# Patient Record
Sex: Male | Born: 1950
Health system: Southern US, Community
[De-identification: ages and names within clinical notes are randomized; demographics above are authoritative.]

## PROBLEM LIST (undated history)

## (undated) DIAGNOSIS — I341 Nonrheumatic mitral (valve) prolapse: Secondary | ICD-10-CM

## (undated) DIAGNOSIS — R29898 Other symptoms and signs involving the musculoskeletal system: Secondary | ICD-10-CM

## (undated) DIAGNOSIS — R011 Cardiac murmur, unspecified: Secondary | ICD-10-CM

## (undated) DIAGNOSIS — N200 Calculus of kidney: Secondary | ICD-10-CM

## (undated) DIAGNOSIS — I00 Rheumatic fever without heart involvement: Secondary | ICD-10-CM

## (undated) DIAGNOSIS — I8390 Asymptomatic varicose veins of unspecified lower extremity: Secondary | ICD-10-CM

## (undated) DIAGNOSIS — I509 Heart failure, unspecified: Secondary | ICD-10-CM

## (undated) DIAGNOSIS — I4891 Unspecified atrial fibrillation: Secondary | ICD-10-CM

## (undated) DIAGNOSIS — R002 Palpitations: Secondary | ICD-10-CM

## (undated) DIAGNOSIS — K219 Gastro-esophageal reflux disease without esophagitis: Secondary | ICD-10-CM

## (undated) DIAGNOSIS — I10 Essential (primary) hypertension: Secondary | ICD-10-CM

## (undated) DIAGNOSIS — E785 Hyperlipidemia, unspecified: Secondary | ICD-10-CM

## (undated) DIAGNOSIS — M1990 Unspecified osteoarthritis, unspecified site: Secondary | ICD-10-CM

## (undated) HISTORY — PX: URETER ECTOPIC RESECTION: SHX2608

## (undated) HISTORY — DX: Unspecified osteoarthritis, unspecified site: M19.90

## (undated) HISTORY — DX: Heart failure, unspecified: I50.9

## (undated) HISTORY — DX: Nonrheumatic mitral (valve) prolapse: I34.1

## (undated) HISTORY — DX: Asymptomatic varicose veins of unspecified lower extremity: I83.90

## (undated) HISTORY — DX: Rheumatic fever without heart involvement: I00

## (undated) HISTORY — DX: Essential (primary) hypertension: I10

## (undated) HISTORY — PX: TONSILLECTOMY: SUR1361

## (undated) HISTORY — PX: HEMORRHOID SURGERY: SHX153

## (undated) HISTORY — DX: Gastro-esophageal reflux disease without esophagitis: K21.9

## (undated) HISTORY — DX: Other symptoms and signs involving the musculoskeletal system: R29.898

## (undated) HISTORY — PX: HYDROCELE EXCISION / REPAIR: SUR1145

## (undated) HISTORY — DX: Calculus of kidney: N20.0

## (undated) HISTORY — PX: INGUINAL HERNIA REPAIR: SHX194

## (undated) HISTORY — DX: Hyperlipidemia, unspecified: E78.5

## (undated) HISTORY — PX: ROTATOR CUFF REPAIR: SHX139

## (undated) HISTORY — PX: OTHER SURGICAL HISTORY: SHX169

## (undated) HISTORY — PX: HERNIA REPAIR: SHX51

## (undated) HISTORY — DX: Palpitations: R00.2

## (undated) HISTORY — DX: Cardiac murmur, unspecified: R01.1

---

## 1999-04-02 ENCOUNTER — Encounter: Payer: Self-pay | Admitting: Emergency Medicine

## 1999-04-02 ENCOUNTER — Inpatient Hospital Stay (HOSPITAL_COMMUNITY): Admission: EM | Admit: 1999-04-02 | Discharge: 1999-04-03 | Payer: Self-pay | Admitting: Emergency Medicine

## 1999-04-03 ENCOUNTER — Encounter: Payer: Self-pay | Admitting: Cardiology

## 1999-11-26 ENCOUNTER — Encounter (HOSPITAL_BASED_OUTPATIENT_CLINIC_OR_DEPARTMENT_OTHER): Payer: Self-pay | Admitting: Internal Medicine

## 1999-11-26 ENCOUNTER — Ambulatory Visit (HOSPITAL_COMMUNITY): Admission: RE | Admit: 1999-11-26 | Discharge: 1999-11-26 | Payer: Self-pay | Admitting: Internal Medicine

## 2000-02-17 ENCOUNTER — Emergency Department (HOSPITAL_COMMUNITY): Admission: EM | Admit: 2000-02-17 | Discharge: 2000-02-17 | Payer: Self-pay | Admitting: Emergency Medicine

## 2000-06-13 ENCOUNTER — Emergency Department (HOSPITAL_COMMUNITY): Admission: EM | Admit: 2000-06-13 | Discharge: 2000-06-13 | Payer: Self-pay | Admitting: Emergency Medicine

## 2000-06-13 ENCOUNTER — Encounter: Payer: Self-pay | Admitting: Emergency Medicine

## 2000-08-04 ENCOUNTER — Encounter: Payer: Self-pay | Admitting: *Deleted

## 2000-08-04 ENCOUNTER — Emergency Department (HOSPITAL_COMMUNITY): Admission: EM | Admit: 2000-08-04 | Discharge: 2000-08-04 | Payer: Self-pay | Admitting: Emergency Medicine

## 2000-08-15 ENCOUNTER — Encounter: Payer: Self-pay | Admitting: Emergency Medicine

## 2000-08-15 ENCOUNTER — Inpatient Hospital Stay (HOSPITAL_COMMUNITY): Admission: EM | Admit: 2000-08-15 | Discharge: 2000-08-16 | Payer: Self-pay | Admitting: Emergency Medicine

## 2001-08-21 ENCOUNTER — Inpatient Hospital Stay (HOSPITAL_COMMUNITY): Admission: EM | Admit: 2001-08-21 | Discharge: 2001-08-22 | Payer: Self-pay | Admitting: Emergency Medicine

## 2001-08-21 ENCOUNTER — Encounter: Payer: Self-pay | Admitting: Emergency Medicine

## 2003-06-16 ENCOUNTER — Encounter: Admission: RE | Admit: 2003-06-16 | Discharge: 2003-06-16 | Payer: Self-pay | Admitting: Internal Medicine

## 2003-09-15 ENCOUNTER — Encounter
Admission: RE | Admit: 2003-09-15 | Discharge: 2003-09-15 | Payer: Self-pay | Admitting: Physical Medicine and Rehabilitation

## 2003-12-29 ENCOUNTER — Encounter
Admission: RE | Admit: 2003-12-29 | Discharge: 2003-12-29 | Payer: Self-pay | Admitting: Physical Medicine and Rehabilitation

## 2004-11-25 ENCOUNTER — Encounter
Admission: RE | Admit: 2004-11-25 | Discharge: 2004-11-25 | Payer: Self-pay | Admitting: Physical Medicine and Rehabilitation

## 2005-02-06 ENCOUNTER — Emergency Department (HOSPITAL_COMMUNITY): Admission: EM | Admit: 2005-02-06 | Discharge: 2005-02-06 | Payer: Self-pay | Admitting: Emergency Medicine

## 2005-03-31 ENCOUNTER — Encounter: Admission: RE | Admit: 2005-03-31 | Discharge: 2005-03-31 | Payer: Self-pay | Admitting: Surgery

## 2005-12-05 ENCOUNTER — Encounter: Admission: RE | Admit: 2005-12-05 | Discharge: 2005-12-05 | Payer: Self-pay | Admitting: Internal Medicine

## 2006-07-03 ENCOUNTER — Encounter: Admission: RE | Admit: 2006-07-03 | Discharge: 2006-07-03 | Payer: Self-pay | Admitting: Internal Medicine

## 2006-12-18 ENCOUNTER — Ambulatory Visit: Payer: Self-pay | Admitting: Internal Medicine

## 2007-01-01 ENCOUNTER — Ambulatory Visit: Payer: Self-pay | Admitting: Gastroenterology

## 2007-07-12 ENCOUNTER — Encounter: Admission: RE | Admit: 2007-07-12 | Discharge: 2007-07-12 | Payer: Self-pay | Admitting: Internal Medicine

## 2007-09-26 DIAGNOSIS — I059 Rheumatic mitral valve disease, unspecified: Secondary | ICD-10-CM | POA: Insufficient documentation

## 2007-09-26 DIAGNOSIS — I491 Atrial premature depolarization: Secondary | ICD-10-CM | POA: Insufficient documentation

## 2007-09-26 DIAGNOSIS — G43909 Migraine, unspecified, not intractable, without status migrainosus: Secondary | ICD-10-CM | POA: Insufficient documentation

## 2007-09-26 DIAGNOSIS — M129 Arthropathy, unspecified: Secondary | ICD-10-CM | POA: Insufficient documentation

## 2007-09-26 DIAGNOSIS — H9190 Unspecified hearing loss, unspecified ear: Secondary | ICD-10-CM | POA: Insufficient documentation

## 2007-09-26 DIAGNOSIS — Z87442 Personal history of urinary calculi: Secondary | ICD-10-CM | POA: Insufficient documentation

## 2007-09-26 DIAGNOSIS — I10 Essential (primary) hypertension: Secondary | ICD-10-CM | POA: Insufficient documentation

## 2008-07-18 ENCOUNTER — Ambulatory Visit (HOSPITAL_BASED_OUTPATIENT_CLINIC_OR_DEPARTMENT_OTHER): Admission: RE | Admit: 2008-07-18 | Discharge: 2008-07-18 | Payer: Self-pay | Admitting: Orthopedic Surgery

## 2009-03-24 ENCOUNTER — Ambulatory Visit (HOSPITAL_COMMUNITY): Admission: RE | Admit: 2009-03-24 | Discharge: 2009-03-24 | Payer: Self-pay | Admitting: Cardiology

## 2009-08-03 DIAGNOSIS — R059 Cough, unspecified: Secondary | ICD-10-CM | POA: Insufficient documentation

## 2010-05-04 ENCOUNTER — Telehealth (INDEPENDENT_AMBULATORY_CARE_PROVIDER_SITE_OTHER): Payer: Self-pay | Admitting: *Deleted

## 2010-07-03 ENCOUNTER — Encounter (HOSPITAL_BASED_OUTPATIENT_CLINIC_OR_DEPARTMENT_OTHER): Payer: Self-pay | Admitting: Internal Medicine

## 2010-07-15 NOTE — Progress Notes (Signed)
   DDS request received sent to [facility]  May 04, 2010 1:03 PM

## 2010-08-05 ENCOUNTER — Inpatient Hospital Stay (HOSPITAL_COMMUNITY)
Admission: EM | Admit: 2010-08-05 | Discharge: 2010-08-06 | DRG: 306 | Disposition: A | Discharge status: Home / Self Care | Payer: PRIVATE HEALTH INSURANCE | Attending: Cardiology | Admitting: Cardiology

## 2010-08-05 ENCOUNTER — Inpatient Hospital Stay (HOSPITAL_COMMUNITY): Payer: PRIVATE HEALTH INSURANCE

## 2010-08-05 ENCOUNTER — Emergency Department (HOSPITAL_COMMUNITY): Payer: PRIVATE HEALTH INSURANCE

## 2010-08-05 DIAGNOSIS — E785 Hyperlipidemia, unspecified: Secondary | ICD-10-CM | POA: Diagnosis present

## 2010-08-05 DIAGNOSIS — Z23 Encounter for immunization: Secondary | ICD-10-CM

## 2010-08-05 DIAGNOSIS — R0602 Shortness of breath: Secondary | ICD-10-CM

## 2010-08-05 DIAGNOSIS — Z7982 Long term (current) use of aspirin: Secondary | ICD-10-CM

## 2010-08-05 DIAGNOSIS — K219 Gastro-esophageal reflux disease without esophagitis: Secondary | ICD-10-CM | POA: Diagnosis present

## 2010-08-05 DIAGNOSIS — F3289 Other specified depressive episodes: Secondary | ICD-10-CM | POA: Diagnosis present

## 2010-08-05 DIAGNOSIS — F329 Major depressive disorder, single episode, unspecified: Secondary | ICD-10-CM | POA: Diagnosis present

## 2010-08-05 DIAGNOSIS — I059 Rheumatic mitral valve disease, unspecified: Principal | ICD-10-CM | POA: Diagnosis present

## 2010-08-05 DIAGNOSIS — I4891 Unspecified atrial fibrillation: Secondary | ICD-10-CM | POA: Diagnosis present

## 2010-08-05 DIAGNOSIS — I509 Heart failure, unspecified: Secondary | ICD-10-CM | POA: Diagnosis present

## 2010-08-05 DIAGNOSIS — I5021 Acute systolic (congestive) heart failure: Secondary | ICD-10-CM | POA: Diagnosis present

## 2010-08-05 DIAGNOSIS — I10 Essential (primary) hypertension: Secondary | ICD-10-CM | POA: Diagnosis present

## 2010-08-05 LAB — DIFFERENTIAL
Basophils Absolute: 0.1 10*3/uL (ref 0.0–0.1)
Basophils Relative: 1 % (ref 0–1)
Eosinophils Absolute: 0.1 10*3/uL (ref 0.0–0.7)
Eosinophils Relative: 1 % (ref 0–5)
Eosinophils Relative: 0 % (ref 0–5)
Lymphocytes Relative: 21 % (ref 12–46)
Lymphocytes Relative: 15 % (ref 12–46)
Lymphs Abs: 1.4 10*3/uL (ref 0.7–4.0)
Monocytes Absolute: 0.5 10*3/uL (ref 0.1–1.0)
Monocytes Relative: 5 % (ref 3–12)
Neutro Abs: 7.5 10*3/uL (ref 1.7–7.7)

## 2010-08-05 LAB — POCT I-STAT, CHEM 8
Glucose, Bld: 112 mg/dL — ABNORMAL HIGH (ref 70–99)
HCT: 42 % (ref 39.0–52.0)
Hemoglobin: 14.3 g/dL (ref 13.0–17.0)
Potassium: 4.6 mEq/L (ref 3.5–5.1)
TCO2: 25 mmol/L (ref 0–100)

## 2010-08-05 LAB — POCT CARDIAC MARKERS

## 2010-08-05 LAB — CBC
HCT: 40.7 % (ref 39.0–52.0)
HCT: 37.6 % — ABNORMAL LOW (ref 39.0–52.0)
Hemoglobin: 12.7 g/dL — ABNORMAL LOW (ref 13.0–17.0)
MCHC: 33.8 g/dL (ref 30.0–36.0)
MCV: 87.6 fL (ref 78.0–100.0)
Platelets: 231 10*3/uL (ref 150–400)
RDW: 14.3 % (ref 11.5–15.5)
RDW: 14.4 % (ref 11.5–15.5)
WBC: 10.1 10*3/uL (ref 4.0–10.5)

## 2010-08-05 LAB — PROTIME-INR
INR: 0.91 (ref 0.00–1.49)
Prothrombin Time: 12.5 seconds (ref 11.6–15.2)

## 2010-08-05 LAB — COMPREHENSIVE METABOLIC PANEL
ALT: 20 U/L (ref 0–53)
Albumin: 3.5 g/dL (ref 3.5–5.2)
Alkaline Phosphatase: 68 U/L (ref 39–117)
GFR calc Af Amer: >60 mL/min (ref >60)
Potassium: 3.9 mEq/L (ref 3.5–5.1)
Sodium: 136 mEq/L (ref 135–145)
Total Protein: 6.4 g/dL (ref 6.0–8.3)

## 2010-08-05 LAB — CARDIAC PANEL(CRET KIN+CKTOT+MB+TROPI)
CK, MB: 3.3 ng/mL (ref 0.3–4.0)
Relative Index: 2.5 (ref 0.0–2.5)

## 2010-08-06 LAB — BASIC METABOLIC PANEL
CO2: 32 mEq/L (ref 19–32)
Calcium: 9.2 mg/dL (ref 8.4–10.5)
Glucose, Bld: 106 mg/dL — ABNORMAL HIGH (ref 70–99)
Sodium: 140 mEq/L (ref 135–145)

## 2010-08-11 ENCOUNTER — Emergency Department (HOSPITAL_COMMUNITY): Payer: PRIVATE HEALTH INSURANCE

## 2010-08-11 ENCOUNTER — Inpatient Hospital Stay (HOSPITAL_COMMUNITY)
Admission: EM | Admit: 2010-08-11 | Discharge: 2010-08-16 | DRG: 287 | Disposition: A | Discharge status: Home Health | Payer: PRIVATE HEALTH INSURANCE | Attending: Cardiology | Admitting: Cardiology

## 2010-08-11 DIAGNOSIS — I059 Rheumatic mitral valve disease, unspecified: Secondary | ICD-10-CM | POA: Diagnosis present

## 2010-08-11 DIAGNOSIS — E876 Hypokalemia: Secondary | ICD-10-CM | POA: Diagnosis present

## 2010-08-11 DIAGNOSIS — E785 Hyperlipidemia, unspecified: Secondary | ICD-10-CM | POA: Diagnosis present

## 2010-08-11 DIAGNOSIS — I509 Heart failure, unspecified: Secondary | ICD-10-CM | POA: Diagnosis present

## 2010-08-11 DIAGNOSIS — H905 Unspecified sensorineural hearing loss: Secondary | ICD-10-CM | POA: Diagnosis present

## 2010-08-11 DIAGNOSIS — N289 Disorder of kidney and ureter, unspecified: Secondary | ICD-10-CM | POA: Diagnosis present

## 2010-08-11 DIAGNOSIS — N200 Calculus of kidney: Secondary | ICD-10-CM | POA: Diagnosis present

## 2010-08-11 DIAGNOSIS — Z87891 Personal history of nicotine dependence: Secondary | ICD-10-CM

## 2010-08-11 DIAGNOSIS — K219 Gastro-esophageal reflux disease without esophagitis: Secondary | ICD-10-CM | POA: Diagnosis present

## 2010-08-11 DIAGNOSIS — I2789 Other specified pulmonary heart diseases: Secondary | ICD-10-CM | POA: Diagnosis present

## 2010-08-11 DIAGNOSIS — I8390 Asymptomatic varicose veins of unspecified lower extremity: Secondary | ICD-10-CM | POA: Diagnosis present

## 2010-08-11 DIAGNOSIS — I5023 Acute on chronic systolic (congestive) heart failure: Principal | ICD-10-CM | POA: Diagnosis present

## 2010-08-11 LAB — BASIC METABOLIC PANEL WITH GFR
BUN: 22 mg/dL (ref 6–23)
CO2: 27 meq/L (ref 19–32)
Calcium: 8.9 mg/dL (ref 8.4–10.5)
Chloride: 103 meq/L (ref 96–112)
Creatinine, Ser: 1.4 mg/dL (ref 0.4–1.5)
GFR calc non Af Amer: 52 mL/min — ABNORMAL LOW
Glucose, Bld: 110 mg/dL — ABNORMAL HIGH (ref 70–99)
Potassium: 3.4 meq/L — ABNORMAL LOW (ref 3.5–5.1)
Sodium: 140 meq/L (ref 135–145)

## 2010-08-11 LAB — DIFFERENTIAL
Basophils Relative: 1 % (ref 0–1)
Monocytes Absolute: 0.5 10*3/uL (ref 0.1–1.0)
Monocytes Relative: 7 % (ref 3–12)
Neutro Abs: 5.3 10*3/uL (ref 1.7–7.7)

## 2010-08-11 LAB — CK TOTAL AND CKMB (NOT AT ARMC)
CK, MB: 2.9 ng/mL (ref 0.3–4.0)
Relative Index: 2.5 (ref 0.0–2.5)
Total CK: 115 U/L (ref 7–232)

## 2010-08-11 LAB — CBC
HCT: 38.3 % — ABNORMAL LOW (ref 39.0–52.0)
Hemoglobin: 13.3 g/dL (ref 13.0–17.0)
MCH: 30.5 pg (ref 26.0–34.0)
MCHC: 34.7 g/dL (ref 30.0–36.0)
MCV: 87.8 fL (ref 78.0–100.0)
Platelets: 256 K/uL (ref 150–400)
RBC: 4.36 MIL/uL (ref 4.22–5.81)
RDW: 14.3 % (ref 11.5–15.5)
WBC: 7.6 K/uL (ref 4.0–10.5)

## 2010-08-11 LAB — BRAIN NATRIURETIC PEPTIDE: Pro B Natriuretic peptide (BNP): 289 pg/mL — ABNORMAL HIGH (ref 0.0–100.0)

## 2010-08-11 LAB — TROPONIN I

## 2010-08-12 DIAGNOSIS — I059 Rheumatic mitral valve disease, unspecified: Secondary | ICD-10-CM

## 2010-08-12 LAB — POCT I-STAT 3, ART BLOOD GAS (G3+)
Acid-Base Excess: 2 mmol/L (ref 0.0–2.0)
Patient temperature: 98.6
TCO2: 26 mmol/L (ref 0–100)
pH, Arterial: 7.493 — ABNORMAL HIGH (ref 7.350–7.450)

## 2010-08-12 LAB — CARDIAC PANEL(CRET KIN+CKTOT+MB+TROPI)
Relative Index: INVALID (ref 0.0–2.5)
Total CK: 88 U/L (ref 7–232)

## 2010-08-12 LAB — POCT I-STAT 3, VENOUS BLOOD GAS (G3P V)
Patient temperature: 98.6
pCO2, Ven: 41.1 mmHg — ABNORMAL LOW (ref 45.0–50.0)
pH, Ven: 7.379 — ABNORMAL HIGH (ref 7.250–7.300)

## 2010-08-12 LAB — BASIC METABOLIC PANEL
CO2: 29 mEq/L (ref 19–32)
Chloride: 102 mEq/L (ref 96–112)
GFR calc Af Amer: >60 mL/min (ref >60)
Potassium: 3.8 mEq/L (ref 3.5–5.1)
Sodium: 141 mEq/L (ref 135–145)

## 2010-08-13 ENCOUNTER — Encounter: Payer: Self-pay | Admitting: Thoracic Surgery (Cardiothoracic Vascular Surgery)

## 2010-08-13 DIAGNOSIS — I059 Rheumatic mitral valve disease, unspecified: Secondary | ICD-10-CM

## 2010-08-13 LAB — BASIC METABOLIC PANEL
BUN: 20 mg/dL (ref 6–23)
CO2: 26 mEq/L (ref 19–32)
Calcium: 9.1 mg/dL (ref 8.4–10.5)
Creatinine, Ser: 1.39 mg/dL (ref 0.4–1.5)
GFR calc Af Amer: >60 mL/min (ref >60)

## 2010-08-13 NOTE — Consult Note (Signed)
[name], [name] NO.:  [id number]  MEDICAL RECORD NO.:  [id number]           PATIENT TYPE:  I  LOCATION:  4703                         FACILITY:  [facility]  PHYSICIAN:  Salvatore Decent. Cornelius Moras, M.D. DATE OF BIRTH:  July 01, 1950  DATE OF CONSULTATION:  08/12/2010 DATE OF DISCHARGE:                                CONSULTATION   REQUESTING PHYSICIAN:  Georga Hacking, MD  REASON FOR CONSULTATION:  Mitral valve prolapse with severe mitral regurgitation.  HISTORY OF PRESENT ILLNESS:  Mr. [name] is a 60 year old disabled white male from [place] with longstanding history of heart murmur dating back to his childhood.  The patient states that he has been told over the years that he likely had rheumatic heart disease.  In recent years, he has been followed by Dr. Viann Fish.  The patient has been having intermittent frequent palpitations associated with premature ventricular and premature atrial contractions.  Echocardiogram performed in 2010 revealed severe mitral regurgitation.  The patient underwent transesophageal echocardiogram at that time confirming severe mitral regurgitation with mitral valve prolapse and a flail segment of the posterior leaflet of the mitral valve with an eccentric jet of regurgitation coursing anteriorly around the left atrium.  Consideration for possible surgical referral was suggested, but the patient was reluctant to proceed with any further diagnostic evaluation.  The patient was hospitalized August 05, 2010, with acute exacerbation of chronic shortness of breath.  The patient developed severe shortness of breath at rest that awoke him from his sleep and associated with orthopnea.  He was admitted to the hospital and ruled out for acute myocardial infarction.  Shortness of breath improved with diuretic therapy for congestive heart failure.  The patient was discharged from the hospital with plans to return early this month for  diagnostic cardiac catheterization.  On August 10, 2010, the patient again developed acute exacerbation of resting shortness of breath as well as vague chest discomfort and cough with tachy palpitations.  The patient returned to the emergency room and was admitted to the hospital.  Chest x-ray revealed mild vascular congestion and BNP level was mildly elevated at 289.  The patient remained in sinus rhythm.  The patient was admitted to the hospital and again treated for acute exacerbation of congestive heart failure.  Left and right heart catheterization was performed by Dr. Donnie Aho on August 12, 2010.  This confirmed the presence of severe mitral regurgitation.  Left ventricular systolic function was normal with ejection fraction of 65%.  There is normal coronary artery anatomy with no significant coronary artery disease.  Distal aortogram revealed no significant aortoiliac occlusive disease.  Right heart catheterization was notable for pulmonary hypertension with PA pressures measured 67/27 and pulmonary capillary wedge pressure of 36 with large V- waves of 67 mmHg.  Central venous pressure was 4.  Mr. [name] has been referred for possible left mitral valve repair.  REVIEW OF SYSTEMS:  GENERAL:  The patient reports normal appetite.  He has had some malaise and increasing fatigue.  He has not had exertional chest pain.  He had some vague nonspecific atypical pain typically associated with  tachy palpitations.  He has had intermittent cough that is nonproductive.  He has had acute exacerbation of resting shortness of breath with longstanding history of exertional shortness of breath.  The patient denies dizzy spells or syncope.  The patient has no difficulty swallowing.  Bowel function is regular.  He denies hematochezia, hematemesis or melena.  The patient has no difficulty urinating nor dysuria.  He has not had fevers or chills.  He does have chronic varicose veins, some lower extremity  edema.  He has some chronic low back pain.  The patient has chronic weakness and pain in his right arm and shoulder related to previous ruptured biceps tendon and multiple surgical procedures.  He is disabled for this.  He has chronic headaches for which he uses Midrin.  He has significant sensorineural hearing loss.  He wears glasses.  He has no recent changes in his eyesight.  He sees a Education officer, community regularly.  PAST MEDICAL HISTORY: 1. Mitral valve prolapse with severe mitral regurgitation. 2. Congestive heart failure, chronic systolic with acute     exacerbations. 3. Palpitations with history of premature ventricular contractions and     premature atrial contractions. 4. Reported history of rheumatic fever. 5. Nephrolithiasis. 6. Varicose veins. 7. GE reflux disease. 8. Hyperlipidemia. 9. Sensorineural hearing loss. 10.Chronic weakness and pain and right upper arm and shoulder related     to ruptured biceps tendon.  PAST SURGICAL PROCEDURES:  Multiple surgical procedures in his use for streptomycin-induced obstructive uropathy.  The patient has also had multiple surgical procedures on his right biceps tendon and shoulder. The patient has had right inguinal herniorrhaphy, tonsillectomy and hemorrhoidectomy in the past.  FAMILY HISTORY:  Noncontributory.  SOCIAL HISTORY:  The patient is divorced and lives alone in [place]. He has a brother who lives nearby who is a retired Emergency planning/management officer.  The patient has a sister who lives in [place] who is a Engineer, civil (consulting).  The patient is disabled secondary to chronic weakness and pain in his right arm related to his previous ruptured biceps tendon.  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  The patient has a remote history of tobacco use, but he quit smoking in 1996.  The patient does not use excessive alcohol.  MEDICATIONS PRIOR TO ADMISSION:  Toprol XL, fish oil capsule, Cardizem CD, aspirin, Lipitor, potassium,  Lasix.  DRUG ALLERGIES:  Intolerance of atenolol and nadolol.  PHYSICAL EXAMINATION:  GENERAL:  The patient is well-appearing male who appears somewhat older than stated age, but is in no acute distress.  He is currently in sinus rhythm. VITAL SIGNS:  Blood pressure 117/79.  He has been afebrile. HEENT:  Unrevealing.  The patient has a large bushy beard. NECK:  Supple.  There is no jugular venous distention.  There is no palpable lymphadenopathy.  There are no carotid bruits. CHEST:  Auscultation of the chest reveals clear breath sounds with exception of bibasilar inspiratory crackles.  No wheezes or rhonchi are noted. CARDIOVASCULAR:  Notable for regular rate and rhythm.  There is a prominent grade 4/6 holosystolic murmur heard all across the precordium, best at the apex with radiation to the axilla and back.  No diastolic murmurs are noted. ABDOMEN:  Soft, nondistended and nontender.  Bowel sounds are present. EXTREMITIES:  Warm and well-perfused.  The right femoral pulses are strong and palpable.  The patient has just been camped in the left femoral approach.  Distal pulses are palpable.  There are  moderate varicose veins.  There is mild bilateral lower extremity edema. SKIN:  Clean, dry and healthy-appearing throughout. RECTAL:  Deferred. GU:  Deferred. NEUROLOGIC:  Grossly nonfocal.  DIAGNOSTIC TEST:  Transthoracic echocardiogram performed August 06, 2010.  This demonstrates mitral valve prolapse with severe (4+) mitral regurgitation.  There is no obvious flail segment other than posterior leaflet of the mitral valve.  This quality of the study is not ideal and leaflet morphology is not clearly seen.  The aortic valve appears normal.  There appears to be a normal left ventricular systolic function.  There is some left atrial enlargement.  There is trace tricuspid regurgitation.  No other abnormalities are noted.  Transesophageal echocardiogram performed 2010, by Dr. Peter  [place] is reviewed.  This as much better sound quality and images of the mitral valve were consistent with fibroelastic deficiency type disease with an obvious flail segment of the middle scalp (P2) of the posterior leaflet and severe (4+) mitral regurgitation.  There is nothing anatomically about this valve that suggests that the underlying disease process is related to rheumatic fever.  Again, the aortic valve appears normal and there is no significant aortic insufficiency.  Left and right heart catheterization performed August 12, 2010, by Dr. Donnie Aho is reviewed.  This demonstrates normal coronary artery anatomy with no significant coronary artery disease.  There is severe left ventricular dysfunction.  The left ventricle is a little dilated. Imaging of the descending thoracic abdominal aorta and iliac vessel is notable for the absence of any significant aortoiliac occlusive disease. Right heart catheterization data are as noted previously.  IMPRESSION:  Mitral valve prolapse with severe mitral regurgitation. The patient has now developed recurrent acute exacerbations of chronic congestive heart failure.  I agree that he needs surgical intervention for elective mitral valve repair.  I think he will be a good candidate for use of minimally invasive approach.  PLAN:  I have discussed options at length with Mr. Shouse and his sister and brother.  Alternative treatment strategies have been discussed.  The rationale for surgical intervention has been reviewed in detail.  Alternative surgical approaches have been discussed.  They understand and accept all associated risks of surgery including, but not limited to risk of death, stroke, myocardial infarction, congestive heart failure, respiratory failure, renal failure, pneumonia, bleeding requiring blood transfusion, arrhythmia, heart block with bradycardia requiring permanent pacemaker, late complications related to valve repair.  I feel  there is a high likelihood that his valve should be repairable.  However, in the event his valve could not be repaired, we would replace it using a mechanical prosthesis given his relatively young age.  All of his questions have been addressed.  We will tentatively plan to proceed with surgery next week.  We will begin Mr. Molyneux on amiodarone prophylactic only to decrease his risk of perioperative atrial arrhythmias.     Salvatore Decent. Cornelius Moras, M.D.     CHO/MEDQ  D:  08/13/2010  T:  08/13/2010  Job:  [id number]  cc:   Georga Hacking, M.D.  Electronically Signed by Tressie Stalker M.D. on 08/13/2010 01:05:24 PM

## 2010-08-14 LAB — BASIC METABOLIC PANEL
BUN: 23 mg/dL (ref 6–23)
CO2: 25 mEq/L (ref 19–32)
Calcium: 9.5 mg/dL (ref 8.4–10.5)
Creatinine, Ser: 1.48 mg/dL (ref 0.4–1.5)
GFR calc non Af Amer: 48 mL/min — ABNORMAL LOW (ref >60)
Glucose, Bld: 84 mg/dL (ref 70–99)
Sodium: 137 mEq/L (ref 135–145)

## 2010-08-15 LAB — BASIC METABOLIC PANEL
BUN: 25 mg/dL — ABNORMAL HIGH (ref 6–23)
CO2: 27 mEq/L (ref 19–32)
Calcium: 9.1 mg/dL (ref 8.4–10.5)
Creatinine, Ser: 1.57 mg/dL — ABNORMAL HIGH (ref 0.4–1.5)
GFR calc non Af Amer: 45 mL/min — ABNORMAL LOW (ref >60)
Glucose, Bld: 100 mg/dL — ABNORMAL HIGH (ref 70–99)
Sodium: 137 mEq/L (ref 135–145)

## 2010-08-16 DIAGNOSIS — Z01810 Encounter for preprocedural cardiovascular examination: Secondary | ICD-10-CM

## 2010-08-16 DIAGNOSIS — I059 Rheumatic mitral valve disease, unspecified: Secondary | ICD-10-CM

## 2010-08-16 LAB — BASIC METABOLIC PANEL
BUN: 23 mg/dL (ref 6–23)
Calcium: 9.2 mg/dL (ref 8.4–10.5)
Creatinine, Ser: 1.41 mg/dL (ref 0.4–1.5)
GFR calc non Af Amer: 51 mL/min — ABNORMAL LOW (ref >60)
Glucose, Bld: 99 mg/dL (ref 70–99)

## 2010-08-16 LAB — MRSA PCR SCREENING: MRSA by PCR: NEGATIVE

## 2010-08-16 LAB — BLOOD GAS, ARTERIAL
Bicarbonate: 23.5 mEq/L (ref 20.0–24.0)
FIO2: 0.21 %
O2 Saturation: 98.1 %
Patient temperature: 98.6
TCO2: 24.6 mmol/L (ref 0–100)
pO2, Arterial: 104 mmHg — ABNORMAL HIGH (ref 80.0–100.0)

## 2010-08-16 LAB — URINALYSIS, MICROSCOPIC ONLY
Bilirubin Urine: NEGATIVE
Ketones, ur: NEGATIVE mg/dL
Leukocytes, UA: NEGATIVE
Nitrite: NEGATIVE
Protein, ur: NEGATIVE mg/dL
pH: 5.5 (ref 5.0–8.0)

## 2010-08-16 LAB — COMPREHENSIVE METABOLIC PANEL
ALT: 19 U/L (ref 0–53)
AST: 19 U/L (ref 0–37)
Alkaline Phosphatase: 82 U/L (ref 39–117)
CO2: 26 mEq/L (ref 19–32)
Chloride: 100 mEq/L (ref 96–112)
GFR calc Af Amer: >60 mL/min (ref >60)
GFR calc non Af Amer: 50 mL/min — ABNORMAL LOW (ref >60)
Glucose, Bld: 94 mg/dL (ref 70–99)
Sodium: 136 mEq/L (ref 135–145)
Total Bilirubin: 1 mg/dL (ref 0.3–1.2)

## 2010-08-16 LAB — ABO/RH: ABO/RH(D): O NEG

## 2010-08-16 LAB — CBC
HCT: 38.3 % — ABNORMAL LOW (ref 39.0–52.0)
Hemoglobin: 13.2 g/dL (ref 13.0–17.0)
MCH: 30.7 pg (ref 26.0–34.0)
RBC: 4.3 MIL/uL (ref 4.22–5.81)

## 2010-08-16 LAB — PROTIME-INR: Prothrombin Time: 13.3 seconds (ref 11.6–15.2)

## 2010-08-17 LAB — HEMOGLOBIN A1C: Hgb A1c MFr Bld: 5.5 % (ref <5.7)

## 2010-08-19 ENCOUNTER — Inpatient Hospital Stay (HOSPITAL_COMMUNITY): Payer: PRIVATE HEALTH INSURANCE

## 2010-08-19 ENCOUNTER — Inpatient Hospital Stay (HOSPITAL_COMMUNITY)
Admission: RE | Admit: 2010-08-19 | Discharge: 2010-08-30 | DRG: 220 | Disposition: A | Discharge status: Home Health | Payer: PRIVATE HEALTH INSURANCE | Source: Ambulatory Visit | Attending: Thoracic Surgery (Cardiothoracic Vascular Surgery) | Admitting: Thoracic Surgery (Cardiothoracic Vascular Surgery)

## 2010-08-19 ENCOUNTER — Other Ambulatory Visit: Payer: Self-pay | Admitting: Thoracic Surgery (Cardiothoracic Vascular Surgery)

## 2010-08-19 DIAGNOSIS — I509 Heart failure, unspecified: Secondary | ICD-10-CM | POA: Diagnosis present

## 2010-08-19 DIAGNOSIS — E785 Hyperlipidemia, unspecified: Secondary | ICD-10-CM | POA: Diagnosis present

## 2010-08-19 DIAGNOSIS — Z7901 Long term (current) use of anticoagulants: Secondary | ICD-10-CM

## 2010-08-19 DIAGNOSIS — I059 Rheumatic mitral valve disease, unspecified: Secondary | ICD-10-CM

## 2010-08-19 DIAGNOSIS — Z87891 Personal history of nicotine dependence: Secondary | ICD-10-CM

## 2010-08-19 DIAGNOSIS — Z7982 Long term (current) use of aspirin: Secondary | ICD-10-CM

## 2010-08-19 DIAGNOSIS — K219 Gastro-esophageal reflux disease without esophagitis: Secondary | ICD-10-CM | POA: Diagnosis present

## 2010-08-19 DIAGNOSIS — R197 Diarrhea, unspecified: Secondary | ICD-10-CM | POA: Diagnosis present

## 2010-08-19 DIAGNOSIS — D62 Acute posthemorrhagic anemia: Secondary | ICD-10-CM | POA: Diagnosis not present

## 2010-08-19 DIAGNOSIS — I5022 Chronic systolic (congestive) heart failure: Secondary | ICD-10-CM | POA: Diagnosis present

## 2010-08-19 HISTORY — PX: MITRAL VALVE REPAIR: SHX2039

## 2010-08-19 LAB — TYPE AND SCREEN
ABO/RH(D): O NEG
Antibody Screen: NEGATIVE

## 2010-08-19 LAB — POCT I-STAT 4, (NA,K, GLUC, HGB,HCT)
Glucose, Bld: 121 mg/dL — ABNORMAL HIGH (ref 70–99)
Glucose, Bld: 111 mg/dL — ABNORMAL HIGH (ref 70–99)
Glucose, Bld: 128 mg/dL — ABNORMAL HIGH (ref 70–99)
HCT: 36 % — ABNORMAL LOW (ref 39.0–52.0)
HCT: 26 % — ABNORMAL LOW (ref 39.0–52.0)
HCT: 23 % — ABNORMAL LOW (ref 39.0–52.0)
Hemoglobin: 8.8 g/dL — ABNORMAL LOW (ref 13.0–17.0)
Hemoglobin: 7.8 g/dL — ABNORMAL LOW (ref 13.0–17.0)
Potassium: 4 mEq/L (ref 3.5–5.1)
Potassium: 3.6 mEq/L (ref 3.5–5.1)
Potassium: 4.9 mEq/L (ref 3.5–5.1)
Sodium: 137 mEq/L (ref 135–145)
Sodium: 132 mEq/L — ABNORMAL LOW (ref 135–145)
Sodium: 133 mEq/L — ABNORMAL LOW (ref 135–145)

## 2010-08-19 LAB — PROTIME-INR
INR: 1.44 (ref 0.00–1.49)
Prothrombin Time: 17.7 seconds — ABNORMAL HIGH (ref 11.6–15.2)

## 2010-08-19 LAB — CBC
Hemoglobin: 9.7 g/dL — ABNORMAL LOW (ref 13.0–17.0)
Hemoglobin: 9.4 g/dL — ABNORMAL LOW (ref 13.0–17.0)
MCH: 30.3 pg (ref 26.0–34.0)
MCHC: 34.6 g/dL (ref 30.0–36.0)
MCV: 88.1 fL (ref 78.0–100.0)
RBC: 3.1 MIL/uL — ABNORMAL LOW (ref 4.22–5.81)
RDW: 14 % (ref 11.5–15.5)
WBC: 10.8 10*3/uL — ABNORMAL HIGH (ref 4.0–10.5)

## 2010-08-19 LAB — GLUCOSE, CAPILLARY
Glucose-Capillary: 101 mg/dL — ABNORMAL HIGH (ref 70–99)
Glucose-Capillary: 88 mg/dL (ref 70–99)
Glucose-Capillary: 129 mg/dL — ABNORMAL HIGH (ref 70–99)

## 2010-08-19 LAB — CREATININE, SERUM: GFR calc non Af Amer: 54 mL/min — ABNORMAL LOW (ref >60)

## 2010-08-19 LAB — POCT I-STAT 3, ART BLOOD GAS (G3+)
Acid-base deficit: 2 mmol/L (ref 0.0–2.0)
Bicarbonate: 25.4 mEq/L — ABNORMAL HIGH (ref 20.0–24.0)
O2 Saturation: 90 %

## 2010-08-19 LAB — PLATELET COUNT: Platelets: 164 10*3/uL (ref 150–400)

## 2010-08-19 LAB — POCT I-STAT GLUCOSE
Glucose, Bld: 106 mg/dL — ABNORMAL HIGH (ref 70–99)
Operator id: 3406

## 2010-08-19 LAB — MAGNESIUM: Magnesium: 3.4 mg/dL — ABNORMAL HIGH (ref 1.5–2.5)

## 2010-08-20 ENCOUNTER — Inpatient Hospital Stay (HOSPITAL_COMMUNITY): Payer: PRIVATE HEALTH INSURANCE

## 2010-08-20 LAB — POCT I-STAT 3, ART BLOOD GAS (G3+)
Acid-base deficit: 3 mmol/L — ABNORMAL HIGH (ref 0.0–2.0)
Bicarbonate: 22.5 mEq/L (ref 20.0–24.0)
Bicarbonate: 22.4 mEq/L (ref 20.0–24.0)
O2 Saturation: 96 %
O2 Saturation: 96 %
TCO2: 24 mmol/L (ref 0–100)
TCO2: 24 mmol/L (ref 0–100)
pCO2 arterial: 33.7 mmHg — ABNORMAL LOW (ref 35.0–45.0)
pCO2 arterial: 39.1 mmHg (ref 35.0–45.0)
pH, Arterial: 7.424 (ref 7.350–7.450)
pO2, Arterial: 86 mmHg (ref 80.0–100.0)

## 2010-08-20 LAB — BASIC METABOLIC PANEL
BUN: 13 mg/dL (ref 6–23)
CO2: 25 mEq/L (ref 19–32)
Calcium: 8.1 mg/dL — ABNORMAL LOW (ref 8.4–10.5)
Chloride: 109 mEq/L (ref 96–112)
Creatinine, Ser: 1.39 mg/dL (ref 0.4–1.5)
GFR calc Af Amer: >60 mL/min (ref >60)

## 2010-08-20 LAB — POCT I-STAT, CHEM 8
BUN: 16 mg/dL (ref 6–23)
Chloride: 107 mEq/L (ref 96–112)
Creatinine, Ser: 1.5 mg/dL (ref 0.4–1.5)
Glucose, Bld: 158 mg/dL — ABNORMAL HIGH (ref 70–99)
Potassium: 4.4 mEq/L (ref 3.5–5.1)
Sodium: 139 mEq/L (ref 135–145)

## 2010-08-20 LAB — CBC
Hemoglobin: 9 g/dL — ABNORMAL LOW (ref 13.0–17.0)
MCH: 30.7 pg (ref 26.0–34.0)
MCHC: 34.4 g/dL (ref 30.0–36.0)
MCV: 89.4 fL (ref 78.0–100.0)
Platelets: 158 10*3/uL (ref 150–400)
RBC: 2.93 MIL/uL — ABNORMAL LOW (ref 4.22–5.81)

## 2010-08-20 LAB — GLUCOSE, CAPILLARY
Glucose-Capillary: 128 mg/dL — ABNORMAL HIGH (ref 70–99)
Glucose-Capillary: 129 mg/dL — ABNORMAL HIGH (ref 70–99)

## 2010-08-20 NOTE — Op Note (Signed)
[name], [name] NO.:  [id number]  MEDICAL RECORD NO.:  [id number]           PATIENT TYPE:  I  LOCATION:  2303                         FACILITY:  [facility]  PHYSICIAN:  Salvatore Decent. Cornelius Moras, M.D. DATE OF BIRTH:  1951/06/08  DATE OF PROCEDURE:  08/19/2010 DATE OF DISCHARGE:                              OPERATIVE REPORT   PREOPERATIVE DIAGNOSIS:  Mitral valve prolapse with severe mitral regurgitation.  POSTOPERATIVE DIAGNOSIS:  Mitral valve prolapse with severe mitral regurgitation.  PROCEDURES:  Right miniature thoracotomy for mitral valve repair (complex valvuloplasty including quadrangular resection of posterior leaflet with sliding leaflet annuloplasty and chordal transposition x2 plus 32-mm Sorin Memo 3-D ring annuloplasty).  SURGEON:  Salvatore Decent. Cornelius Moras, MD  ASSISTANT:  Doree Fudge, PA  ANESTHESIA:  Dr. Adonis Huguenin.  BRIEF CLINICAL NOTE:  The patient is a 60 year old male with longstanding history of heart murmur dating back to his childhood.  The patient has been told in the past that he likely had rheumatic heart disease.  During recent years, the patient has been followed by Dr. Viann Fish.  Echocardiogram performed in 2010 demonstrated severe mitral regurgitation.  Transesophageal echocardiogram confirmed the presence of flail segment of the posterior leaflet of the mitral valve and severe mitral regurgitation.  The patient did not wish to proceed with surgical intervention at that time.  More recently, the patient had returned to follow up to see Dr. Donnie Aho and he has been hospitalized on 2 successive occasions with episodes of resting shortness of breath that improved with diuretic therapy.  Left and right heart catheterization performed on August 12, 2010, confirmed the presence of severe mitral regurgitation with moderate pulmonary hypertension.  There was normal coronary artery anatomy with no significant coronary artery  disease. Followup transthoracic echocardiogram confirmed the presence of severe mitral regurgitation with some left ventricular chamber enlargement and mild left ventricular dysfunction.  Full consultation note has been dictated previously.  The patient has been counseled regarding the indications, risks, and potential benefits of surgery.  Alternative treatment strategies have been discussed in detail.  The patient provides full informed consent for the surgery as described.  OPERATIVE FINDINGS: 1. Fibroelastic deficiency type II dysfunction with flail portion of     the posterior leaflet of the mitral valve and severe mitral     regurgitation. 2. Mild left ventricular chamber enlargement. 3. Normal left ventricular systolic function. 4. No residual mitral regurgitation following successful mitral valve     repair.  OPERATIVE PROCEDURE IN DETAIL:  The patient was brought to the operating room on the above-mentioned date and central monitoring was established by the anesthesia team under the care and direction of Dr. Adonis Huguenin. Specifically, a Swan-Ganz catheter was placed through the left internal jugular approach.  A radial arterial line was placed.  Intravenous antibiotics were administered.  The patient was placed in the supine position on the operating table.  General endotracheal anesthesia was induced uneventfully.  The patient was initially intubated with a dual- lumen endotracheal tube.  The patient was placed in a supine position on the operating table with a soft roll behind  the right scapula and the neck gently extended and turned towards the left.  A Foley catheter was placed.  The patient's right neck, chest, abdomen, both groins, and both lower extremities were prepared and draped in sterile manner.  Baseline transesophageal echocardiogram was performed by Dr. Krista Blue. There was obvious flail segment of the middle scallop (P2) of the posterior leaflet of the mitral  valve with severe (4+) mitral regurgitation.  There was mild left ventricular chamber enlargement with normal left ventricular systolic function.  Right ventricle and right atrial size is normal.  There is no tricuspid regurgitation.  The tricuspid annulus is normal sized.  The aortic valve is normal.  No other abnormalities are noted.  A small incision was made in the right inguinal crease and the anterior surface of the right common femoral artery and right common femoral vein were dissected through the incision.  The femoral artery is normal in appearance.  The femoral vein was dissected from level of the greater saphenous bulb.  Single lung ventilation was begun.  Right miniature anterolateral thoracotomy incision was performed.  A 6-cm incision was placed just superior to and lateral to the right nipple.  The pectoralis major muscle was retracted medially and completely preserved.  The right pleural space was entered through the third intercostal space.  A soft tissue retractor was placed.  Two 11-mm ports were placed inferiorly through separate stab incisions and the right pleural space was insufflated continuously with carbon dioxide gas through the posterior port.  A longitudinal incision was made in the pericardium 3 cm anterior to the phrenic nerve.  Silk traction sutures were placed on either side of the incision for exposure.  A pledgeted suture was placed in the dome of the right hemidiaphragm and retracted inferiorly to facilitate exposure.  The patient was placed in Trendelenburg position.  The right internal jugular vein was cannulated with a Seldinger technique and a flexible guidewire was advanced into the right atrium.  The patient was heparinized systemically.  A pursestring suture was placed in the anterior surface of the right greater saphenous bulb.  Two concentric pursestring sutures were placed on the anterior surface of the right common femoral artery.  The  right common femoral vein was cannulated through the greater saphenous bulb with a Seldinger technique and a flexible guidewire was advanced under transesophageal echocardiogram guidance up through the inferior vena cava through the right atrium into the superior vena cava.  The femoral vein was dilated with serial dilators and cannulated with a 22-French long femoral venous cannula. The femoral artery was cannulated with Seldinger technique and a flexible guidewire was advanced until it was appreciated intraluminally on transesophageal echocardiogram of the descending thoracic aorta.  The femoral artery was dilated with serial dilators and cannulated with an 18-French femoral arterial cannula.  The right internal jugular vein was now dilated with serial dilators and cannulated with a 14-French pediatric femoral venous cannula.  Cardiopulmonary bypass was begun.  Vacuum assist venous drainage was utilized.  Venous drainage was excellent.  The pericardial incision was extended in both directions.  Exposure was excellent.  Attempts were made to place a retrograde cardioplegic cannula into the coronary sinus with transesophageal echocardiogram, but these were unsuccessful due to relatively small size coronary sinus.  An antegrade cardioplegic cannula was placed directly in the ascending aorta.  The patient was cooled to 28 degrees systemic temperature.  The aortic crossclamp was applied and cold blood cardioplegia was delivered in an antegrade fashion through the  aortic root.  The initial cardioplegic arrest was rapid with early diastolic arrest.  Repeat doses of cardioplegia were administered intermittently throughout the crossclamp portion of the operation every 20-30 minutes to maintain completely flat electrocardiogram.  Myocardial protection was felt to be excellent.  Left atriotomy incision was performed posteriorly through the interatrial groove and continued partway across the back  wall of the left atrium after opening the oblique sinus inferiorly.  A retractor blade was placed into the left atrium and fixed to a separate sidearm placed through a small stab incision just to the right side of the sternum through the third intercostal space.  Exposure of the mitral valve and the floor of the left atrium was excellent.  The mitral valve was carefully examined.  There was fibroelastic deficiency type disease with obvious flail middle scallop (P2) of the posterior leaflet.  The flail portion was somewhat eccentrically located towards P3 and involved in the indentation or cleft between P2 and P3.  There was some calcification in the subvalvular apparatus beneath P1, but there is no associated restriction of P1.  P1 and P3 appear essentially normal.  The anterior leaflet is normal.  No other abnormalities are noted.  The flail segment of P2 is slightly tall.  Interrupted 2-0 Ethibond horizontal mattress sutures were placed circumferentially around the entire mitral valve annulus.  These sutures will ultimately be utilized for ring annuloplasty, and at this juncture they are utilized to suspend the valve symmetrically.  The flail segment of P2 is now addressed.  This area is corrected using a quadrangular resection of P2 comprising approximately 50% of the surface area of P2 beginning in the midportion of P2 and extending to the cleft orindentation between P2 and P3.  Two secondary chords from the undersurface of P2 were transected and preserved to be utilized for chordal translocation to support the free margin of the final repair. Because of the slightly tall nature of P2 and the asymmetric location of the resected specimen, a sliding plasty was performed mobilizing the remainder of P2 and portion of P1 as the remainder P2 and midline portion of P1 are mobilized off the posterior mitral annulus.  The overall height of P2 was then shortened several millimeters.   The remaining portion of P2 and P1 were now reattached to the posterior mitral annulus using a two-layer closure of running CV 5 Gore-Tex suture.  The 2 translocation chords are now reattached to the free margin of P2 with CV 5 Gore-Tex chords.  The intervening vertical defect between P2 and P3 is now closed using interrupted CV 5 everting simple Gore-Tex sutures.  At this portion, the valve was tested with saline and appears to be perfectly competent with a symmetrical line of coaptation.  The mitral valve was sized to accept a 32-mm annuloplasty ring, which is slightly larger than the dimension of the anterior leaflet.  A Sorin Memo 3-D annuloplasty ring (size 32 mm, catalog number [id number], serial number [id number]) was secured in place uneventfully.  The valve was now tested with saline and appears to be competent with a slight amount of billowing at the posterior commissure.  This was corrected with 2 everting CV 5 Gore-Tex simple Magic sutures placed in the commissure. The valve was again tested with saline and appears to be perfectly competent with a broad symmetrical line of coaptation of the anterior posterior leaflet.  There is no residual leak.  A broad line of coaptation was verified with a blue ink test.  Rewarming was begun.  A sump drain was placed across the mitral valve to serve as a left ventricular vent.  The left atriotomy was closed using a two-layer closure of running 3-0 Prolene suture.  The lungs were ventilated and heart allowed to fill after which time one single antegrade hot shot cardioplegia dose was administered.  The aortic crossclamp was removed after total crossclamp time of 112 minutes.  The heart began to beat spontaneously without need for cardioversion. Epicardial pacing wire was fixed to the undersurface of the right ventricular free wall into the right atrial appendage.  The patient was rewarmed to 37 degrees centigrade temperature.  The lungs  were ventilated and heart allowed to fill after which time the left ventricular vent was removed.  The lungs were again ventilated and heart allowed to fill while the echocardiogram was briefly examined.  The mitral valve repair appears intact.  At this juncture, the antegrade cardioplegic cannula was removed.  The patient was weaned from cardiopulmonary bypass without difficulty. The patient's rhythm at separation from bypass is normal sinus rhythm. Atrial pacing was employed to increase heart rate.  No inotropic support was required.  Total cardiopulmonary bypass time for the operation was 165 minutes.  Followup transesophageal echocardiogram performed by Dr. Krista Blue.  After separation from bypass demonstrates well-seated annuloplasty ring in the mitral position.  The mitral valve was functioning normally.  There is no residual mitral regurgitation.  Left ventricular function is normal. No other abnormalities are noted.  Protamine was administered to reverse the anticoagulation.  The femoral, venous, and arterial cannulae are removed uneventfully and a pursestring sutures are secured.  There is a palpable pulse in the distal right femoral artery.  The right internal jugular cannula was removed and manual pressure was held on the next 30 minutes.  Single lung ventilation was begun to allow the right lung to collapse. The atriotomy incision was inspected for hemostasis.  The pericardial space was drained with a 28-French Bard chest tube placed through the anterior port incision.  The pericardium was closed laterally using a patch of core matrix bovine submucosal tissue patch.  The right pleural space was irrigated with saline solution and inspected for hemostasis. The right pleural space was drained with a 28-French Bard chest tube placed through the posterior port incision.  The minithoracotomy incision was closed in multiple layers in routine fashion.  The right groin incision was  closed in multiple layers in routine fashion.  All skin incisions were closed with subcuticular skin closures.  The chest tubes were fixed to closed suction drainage device.  The patient is reintubated with a single-lumen endotracheal tube and transported to the surgical intensive care unit in stable condition. There were no intraoperative complications.  All sponge, instrument, and needle counts were verified correct at the completion of the operation. No blood products were administered.     Salvatore Decent. Cornelius Moras, M.D.     CHO/MEDQ  D:  08/19/2010  T:  08/20/2010  Job:  [id number]  cc:   Georga Hacking, M.D.  Electronically Signed by Tressie Stalker M.D. on 08/20/2010 06:31:42 AM

## 2010-08-21 ENCOUNTER — Inpatient Hospital Stay (HOSPITAL_COMMUNITY): Payer: PRIVATE HEALTH INSURANCE

## 2010-08-21 LAB — BASIC METABOLIC PANEL
CO2: 26 mEq/L (ref 19–32)
Chloride: 105 mEq/L (ref 96–112)
Creatinine, Ser: 1.23 mg/dL (ref 0.4–1.5)
GFR calc Af Amer: >60 mL/min (ref >60)
Glucose, Bld: 97 mg/dL (ref 70–99)

## 2010-08-21 LAB — CBC
HCT: 29.6 % — ABNORMAL LOW (ref 39.0–52.0)
Hemoglobin: 9.7 g/dL — ABNORMAL LOW (ref 13.0–17.0)
MCH: 30.1 pg (ref 26.0–34.0)
MCHC: 32.8 g/dL (ref 30.0–36.0)
RBC: 3.22 MIL/uL — ABNORMAL LOW (ref 4.22–5.81)

## 2010-08-21 LAB — PROTIME-INR: INR: 1.35 (ref 0.00–1.49)

## 2010-08-22 ENCOUNTER — Inpatient Hospital Stay (HOSPITAL_COMMUNITY): Payer: PRIVATE HEALTH INSURANCE

## 2010-08-22 LAB — BASIC METABOLIC PANEL
CO2: 28 mEq/L (ref 19–32)
Chloride: 100 mEq/L (ref 96–112)
GFR calc Af Amer: >60 mL/min (ref >60)
Potassium: 3.8 mEq/L (ref 3.5–5.1)

## 2010-08-22 LAB — CBC
HCT: 30 % — ABNORMAL LOW (ref 39.0–52.0)
Hemoglobin: 9.8 g/dL — ABNORMAL LOW (ref 13.0–17.0)
MCHC: 32.7 g/dL (ref 30.0–36.0)
MCV: 90.4 fL (ref 78.0–100.0)
WBC: 7.4 10*3/uL (ref 4.0–10.5)

## 2010-08-22 LAB — PROTIME-INR: INR: 1.25 (ref 0.00–1.49)

## 2010-08-23 ENCOUNTER — Inpatient Hospital Stay (HOSPITAL_COMMUNITY): Payer: PRIVATE HEALTH INSURANCE

## 2010-08-23 LAB — PROTIME-INR
INR: 1.43 (ref 0.00–1.49)
Prothrombin Time: 17.6 seconds — ABNORMAL HIGH (ref 11.6–15.2)

## 2010-08-24 LAB — PROTIME-INR: INR: 2.08 — ABNORMAL HIGH (ref 0.00–1.49)

## 2010-08-24 LAB — CLOSTRIDIUM DIFFICILE BY PCR: Toxigenic C. Difficile by PCR: NEGATIVE

## 2010-08-26 LAB — PROTIME-INR: INR: 2.49 — ABNORMAL HIGH (ref 0.00–1.49)

## 2010-08-26 NOTE — Discharge Summary (Signed)
  [name], [name] NO.:  [id number]  MEDICAL RECORD NO.:  [id number]           PATIENT TYPE:  I  LOCATION:  2021                         FACILITY:  [facility]  PHYSICIAN:  Salvatore Decent. [name], M.D. DATE OF BIRTH:  Jun 02, 1951  DATE OF ADMISSION:  08/19/2010 DATE OF DISCHARGE:  08/25/2010                              DISCHARGE SUMMARY   ADDENDUM  BRIEF HOSPITAL COURSE STAY:  Since last dictation, the patient has remained afebrile and vital signs stable.  He did have complaints of loose stools as a result, his amiodarone and Crestor were stopped.  C. diff was checked and was found to be negative.  He has loose stools and they are becoming more formed as of this morning.  Currently, on postop day #6, he has already been tolerating a diet.  He is afebrile.  His heart rate is in the 80s, BP 142/87, O2 sat 98% on room air.  Preop weight 82 kg, today's weight 82.7 kg.  PT and INR checked 26.1 and 2.38 respectively.  PHYSICAL EXAMINATION:  CARDIOVASCULAR:  Regular rate and rhythm.  No murmur. PULMONARY:  Slight decrease at the bases, some crackles in the left. ABDOMEN:  Soft, nontender.  Bowel sounds present. EXTREMITIES:  Mild lower extremity edema. SKIN:  His right anterior chest wound is clean, dry, and continuing to heal.  On tele, he is maintaining sinus rhythm on Toprol-XL 100 mg p.o. daily. Per Dr. Cornelius [name], he will not be restarted on the amiodarone secondary to having his loose stools previously.  In addition, he will not be restarted on atorvastatin again as this may have been contributing to his loose stools. This may be resumed later as an outpatient.  The patient is going to continue to be diuresed with 40 mg p.o. daily of Lasix with a potassium supplement.  He has also been given Mucinex for a productive cough that has just recently began causing him some left- sided rib pain.  He is felt surgically stable for discharge to an SNF today.  DISCHARGE  MEDICATIONS:  At the time of this dictation include the following: 1. Enteric-coated aspirin 81 mg p.o. daily. 2. Mucinex 600 mg p.o. two times daily for cough. 3. Oxycodone 5 mg one to two tabs every 4-6 hours p.r.n. pain. 4. Coumadin 2.5 mg p.o. every evening or as directed by Dr. York Spaniel     office. 5. Lasix 40 mg p.o. daily. 6. Potassium chloride 20 mEq p.o. daily. 7. Toprol-XL 100 mg p.o. daily. Patient was not placed on an ACE inhib or ARB secondary to preserved EF.    Doree Fudge, PA   ______________________________ Salvatore Decent [name], M.D.    DZ/MEDQ  D:  08/25/2010  T:  08/25/2010  Job:  [id number]  cc:   Georga Hacking, M.D.  Electronically Signed by Doree Fudge PA on 08/26/2010 10:14:15 AM Electronically Signed by Tressie Stalker M.D. on 08/26/2010 11:05:10 PM

## 2010-08-26 NOTE — Discharge Summary (Signed)
[name] NO.:  [id number]  MEDICAL RECORD NO.:  [id number]           PATIENT TYPE:  I  LOCATION:  2021                         FACILITY:  [facility]  PHYSICIAN:  Scott Decent. Cornelius [name], M.D. DATE OF BIRTH:  28-Dec-1950  DATE OF ADMISSION:  08/19/2010 DATE OF DISCHARGE:                              DISCHARGE SUMMARY   HISTORY:  The patient is a 60 year old disabled white male from [place] with a longstanding history of a heart murmur dating back to his childhood.  The patient states that he has been told over the years that he likely had rheumatic heart disease.  In recent years, he has been followed by Dr. Viann Fish.  The patient has been having intermittent frequent palpitations associated with premature ventricular and premature atrial contractions.  Echocardiogram performed in 2010, revealed severe mitral regurgitation.  The patient underwent transesophageal echocardiogram at that time confirming severe mitral regurgitation with mitral valve prolapse and a flail segment of the posterior leaflet of the mitral valve and an eccentric GI regurgitation coursing anteriorly around the left sub atrium.  Consideration for possible surgical referral was suggested, but the patient was reluctant to proceed with any further diagnostic evaluation.  The patient was hospitalized August 05, 2010, with acute exacerbation of chronic shortness of breath.  The patient developed severe shortness of breath at rest and was awoken from his sleep and had associated orthopnea.  He was admitted to the hospital and ruled out for myocardial infarction. Shortness of breath improved with diuretic therapy for congestive heart failure.  The patient was discharged from the hospital with plans to return this month for diagnostic cardiac catheterization.  On August 10, 2010, the patient again developed acute exacerbation of resting shortness of breath as well as a vague chest  discomfort and cough with tachy palpitations.  The patient returned to the emergency room and was admitted to the hospital.  Chest x-ray reveals mild vascular congestion and a BNP level was mildly elevated at 289.  The patient remained in sinus rhythm.  The patient was admitted to the hospital and again treated for acute exacerbation of congestive heart failure.  Left and right heart catheterization was performed by Dr. Donnie Aho on August 12, 2010.  This confirmed the presence of severe mitral regurgitation.  Left ventricular systolic function was normal and ejection fraction was 65%. There was normal coronary artery anatomy with no significant coronary artery disease.  Distal aortogram revealed no significant aortoiliac occlusive disease.  Right heart catheterization was notable for pulmonary hypertension with PA pressures measuring 67/27 and pulmonary capillary wedge pressure of 36 with a large V-wave of 67 mmHg.  Central venous pressure was 4.  Due to these findings and the severity of his recent symptoms, he was referred for cardiothoracic surgical consultation with Dr. Tressie Stalker.  Dr. Cornelius [name] has evaluated the patient and his studies and agreed with recommendations to proceed with mitral valve replacement versus repair and he was admitted this hospitalization for the procedure.  REVIEW OF SYMPTOMS:  Please see the history and physical done prior to admission.  PAST MEDICAL HISTORY: 1. Mitral valve  prolapse with severe mitral regurgitation. 2. Congestive heart failure, chronic systolic with acute     exacerbations. 3. Palpitations with history of premature ventricular contractions and     premature atrial contractions. 4. Reported history of rheumatic fever. 5. Nephrolithiasis. 6. Varicose veins. 7. Gastroesophageal reflux disease. 8. Hyperlipidemia. 9. Sensorineural hearing loss. 10.Chronic weakness and pain of the right upper arm and shoulder     related to a ruptured biceps  tendon.  PAST SURGICAL PROCEDURES: 1. Multiple surgical procedures for streptomycin-induced obstructive     uropathy. 2. Multiple surgical procedures on his right biceps tendon and     shoulder. 3. Right inguinal herniorrhaphy. 4. Tonsillectomy. 5. Previous hemorrhoidectomy.  FAMILY HISTORY:  Noncontributory.  SOCIAL HISTORY:  The patient is divorced and lives alone in [place]. He has a brother who lives nearby who was a retired Emergency planning/management officer.  The patient has a sister who lives in [place] and is a Engineer, civil (consulting).  The patient is disabled secondary to chronic weakness and pain in his right arm related to his previous ruptured biceps tendon.  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  The patient has a remote history of tobacco use, but quit smoking in 1996.  The patient does not use excessive alcohol.  MEDICATIONS PRIOR TO ADMISSION: 1. Toprol XL. 2. Fish oil. 3. Cardizem CD. 4. Aspirin. 5. Lipitor. 6. Potassium. 7. Lasix.  ALLERGIES:  Intolerance of ATENOLOL and NADOLOL.  PHYSICAL EXAMINATION:  Please see the history and physical done at the time of admission.  HOSPITAL COURSE:  The patient was admitted electively and on August 19, 2010, taken to the operating room where he underwent the following procedure; right miniature thoracotomy for mitral valve repair (complex valvuloplasty including quadrangular resection of the posterior leaflet with sliding leaflet annuloplasty and chordal transposition x2 plus a 32- mm Sorin memo 3-D ring annuloplasty.  Procedure was performed by Dr. Tressie Stalker, tolerated well.  OPERATIVE FINDINGS: 1. Fibroelastic deficiency type II dysfunction with flail portion of     the posterior leaflet of the mitral valve and severe mitral     regurgitation. 2. Mild left ventricular chamber enlargement. 3. Normal left ventricular systolic function. 4. No residual mitral regurgitation following successful mitral valve      repair.  POSTOPERATIVE HOSPITAL COURSE:  The patient has done quite well.  He has maintained stable hemodynamics.  He did have episode of rapid atrial fibrillation, but has subsequently returned to normal sinus rhythm.  He has been started back on amiodarone as well as beta blocker.  He does have an acute blood loss anemia.  His most recent hemoglobin and hematocrit dated  August 22, 2010, are 9.8 and 30 respectively.  Most recent BUN and creatinine dated same date are 14 and 1.13.  He has been started on Coumadin during the postoperative period and most recent INR on August 23, 2010, was 1.43 and tentatively he will be scheduled for Coumadin dose of 5 mg daily with close followup.  All routine lines, monitors and drainage devices have been discontinued in the standard fashion.  His incisions are healing without evidence of infection.  He has been weaned from oxygen and is tolerating this well with good saturations on room air.  He has responded well to a gentle diuresis. He is tolerating routine activities using standard postoperative cardiac surgical protocols.  Currently, he is tentatively felt to be stable for discharge in the morning of August 24, 2010, pending morning  round reevaluation.  MEDICATIONS AT DISCHARGE:  At the time of this dictation include the following: 1. Aspirin 81 mg tablet p.o. daily. 2. Oxycodone 5 mg IR tablet 1-2 every 4-6 h. p.r.n. for pain. 3. Coumadin 5 mg daily and as directed through [facility]     managed through Dr. York Spaniel office. 4. Atorvastatin 20 mg 1-1/2 tablet every evening. 5. Amiodarone 200 mg p.o. b.i.d. 6. Lasix 40 mg 1 tablet twice daily. 7. Potassium chloride 20 mEq 1 tablet twice daily. 8. Toprol-XL 100 mg tablet daily. 9. For the time being, he has not been restarted on his Cardizem CD. 10.Additionally, we will determine his final Lasix dose at the time of     discharge.  INSTRUCTIONS:  The patient will receive written  instructions regarding medications, activity, diet, wound care and followup.  Followup include Dr. Cornelius [name] on August 30, 2010, at 1:30 p.m. with a chest x-ray. Additionally, he is instructed to arrange an appointment to see Dr. Donnie Aho in 2 weeks and also have his PT/INR checked through Dr. York Spaniel office on August 26, 2010.  FINAL DIAGNOSES:  Severe mitral valve regurgitation secondary to mitral valve prolapse with fibroelastic deficiency type II dysfunction that is now status post mitral valve repair as described above.  OTHER DIAGNOSES: 1. Postoperative acute blood loss anemia. 2. Postoperative paroxysmal atrial fibrillation. 3. History of premature atrial contractions and premature ventricular     contractions. 4. History of congestive heart failure with chronic systolic and acute     exacerbations as described above. 5. Reported history of rheumatic fever. 6. History of nephrolithiasis. 7. History of varicose veins. 8. Gastroesophageal reflux disease. 9. Hyperlipidemia. 10.Sensorineural hearing loss. 11.Chronic weakness and pain of the right upper arm related to a     ruptured biceps tendon.  Currently at the time of this dictation it is not certain for sure or whether he will be going home or possibly to a short-term nursing home facility.  This will be determined at the time of discharge.     [name], P.A.-C.______________________________ [name], M.D.    Sherryll Burger  D:  08/23/2010  T:  08/24/2010  Job:  [id number]  cc:   Scott Decent. Cornelius [name], M.D. Jarrett Ables, [facility]  Electronically Signed by Deniece Portela GOLD P.A.-C. on 08/25/2010 11:04:47 AM Electronically Signed by Tressie Stalker M.D. on 08/26/2010 07:36:08 AM

## 2010-08-27 LAB — GLUCOSE, CAPILLARY: Glucose-Capillary: 90 mg/dL (ref 70–99)

## 2010-08-27 LAB — PROTIME-INR: INR: 2.01 — ABNORMAL HIGH (ref 0.00–1.49)

## 2010-08-29 LAB — PROTIME-INR: Prothrombin Time: 23.3 seconds — ABNORMAL HIGH (ref 11.6–15.2)

## 2010-08-30 LAB — BASIC METABOLIC PANEL
BUN: 15 mg/dL (ref 6–23)
CO2: 28 mEq/L (ref 19–32)
Chloride: 96 mEq/L (ref 96–112)
GFR calc non Af Amer: 59 mL/min — ABNORMAL LOW (ref >60)
Glucose, Bld: 97 mg/dL (ref 70–99)
Potassium: 4.2 mEq/L (ref 3.5–5.1)
Sodium: 132 mEq/L — ABNORMAL LOW (ref 135–145)

## 2010-08-30 LAB — GLUCOSE, CAPILLARY: Glucose-Capillary: 119 mg/dL — ABNORMAL HIGH (ref 70–99)

## 2010-08-30 NOTE — Procedures (Signed)
[name], [name] NO.:  [id number]  MEDICAL RECORD NO.:  [id number]           PATIENT TYPE:  I  LOCATION:  4703                         FACILITY:  [facility]  PHYSICIAN:  Georga Hacking, M.D.DATE OF BIRTH:  09-26-1950  DATE OF PROCEDURE:  08/12/2010                            CARDIAC CATHETERIZATION   HISTORY:  A 60 year old male with known mitral regurgitation who presented with worsening heart failure and he has severe mitral regurgitation.  The study is done to assess suitability for operation.  PROCEDURES: 1. Right and left heart catheterization with coronary angiograms. 2. Left ventriculogram. 3. Measurement of right heart pressures. 4. Distal aortogram.  COMMENTS ABOUT PROCEDURE:  The patient was brought initially to room #5 and was prepped and draped in the usual manner.  After Xylocaine anesthesia, a 5-French sheath was placed in the left femoral artery when initial attempts to cannulate the right femoral vein were unsuccessful. The fluoroscopy was not working in this room and the patient had to be moved to room #1 at that point.  Appropriate sterile technique was used during the transfer.  Following this, multiple attempts were made to cannulate the left femoral vein but was unsuccessful.  I elected to proceed with left heart catheterization which was done using 5-French catheters and a 25 mL ventriculogram was performed and then a 30 mL distal aortogram injection was performed.  The right groin was then prepped and a 8-French sheath was placed in the right femoral vein percutaneously and pulmonary artery pressures and cardiac outputs and saturations were measured.  The patient was taken to the holding area for sheath removal.  He tolerated the procedure well.  HEMODYNAMIC DATA: 1. Right atrium; A equals 5, V equals 8, mean equals 4. 2. Right ventricle 67/6. 3. Pulmonary artery 67/27, percent saturation 57%. 4. Pulmonary capillary wedge  pressure; A equals 36, V equals 67. 5. Cardiac output 3.9 liters per minute. 6. Aorta postcontrast 102/65, percent saturation 98%. 7. Left ventricle 102/22.  ANGIOGRAPHIC DATA:  Left ventriculogram:  Performed in the 30-degree RAO projection.  The aortic valve is normal.  The mitral valve is normal. There is calcification noted in the region of the lateral wall and mitral annulus.  The aortic valve is normal.  The left ventricle is normal in size with estimated ejection fraction of 65%.  There is severe mitral regurgitation noted.  Coronary arteries arise and distribute normally.  The left main coronary artery is normal.  The left anterior descending contains minimal irregularity and extends to the apex, there is a large diagonal branch.  Circumflex has a marginal artery that is large that is free of disease.  The right coronary artery is a dominant vessel and is free of disease.  IMPRESSION: 1. Severe mitral regurgitation. 2. Moderate-to-severe pulmonary hypertension with large V-waves. 3. No significant coronary artery disease identified. 4. Distal aortogram showing a smooth distal aorta with patent renal     arteries and good runoff.     Georga Hacking, M.D.     WST/MEDQ  D:  08/12/2010  T:  08/13/2010  Job:  [id number]  cc:  Dr. Barkley Boards H. Cornelius Moras, M.D. Barry Dienes Eloise Harman, M.D.  Electronically Signed by Lacretia Nicks. Donnie Aho M.D. on 08/30/2010 04:59:12 PM

## 2010-08-30 NOTE — Discharge Summary (Signed)
NAMEKYRILLOS, ADAMS NO.:  [id number]  MEDICAL RECORD NO.:  [id number]           PATIENT TYPE:  I  LOCATION:  2033                         FACILITY:  [facility]  PHYSICIAN:  Georga Hacking, M.D.DATE OF BIRTH:  1950-12-06  DATE OF ADMISSION:  08/05/2010 DATE OF DISCHARGE:  08/06/2010                              DISCHARGE SUMMARY   FINAL DIAGNOSES: 1. Acute congestive heart failure due to mitral valve disease. 2. Severe mitral regurgitation due to ruptured chordae tendineae and     partially flail posterior mitral valve leaflet. 3. Hypertension. 4. History of premature atrial contractions and premature ventricular     contractions. 5. History of reflux. 6. History of depression.  PROCEDURES:  Echocardiogram.  CONSULTATIONS:  Clarence H. Cornelius Moras, MD  HISTORY:  The patient is a 60 year old male with a past history of significant heart disease with severe mitral regurgitation as well as arrhythmias.  I have been following him and I have recommended that he consider mitral valve repair, but he was unwilling to do so first because of social reasons due to the death of his significant other and then because of shoulder surgery, and finally because of losing his insurance recently.  He awoke on the day of admission at 1:00 a.m. with sudden onset of dyspnea, cough, and diaphoresis.  He had noticed some increased abdominal girth, bloating, as well as dyspnea on exertion for a few weeks.  He was treated with nitroglycerin and Lasix with improvement in his symptomatology.  Oxygenation improved with diuresis. He may have had some vague substernal chest discomfort with exertion also.  Please see the previously dictated history and physical for remainder of the details.  HOSPITAL COURSE:  Laboratory data on admission showed a normal CBC and protime.  Chemistry panel shows a sodium of 139, potassium 4.6, glucose 112, BUN 21, creatinine of 1.5.  Liver enzymes were normal.   Serial CPK and troponins were normal.  B natriuretic peptide was 299 on admission and fell to 210 with diuresis.  Chest x-ray showed some interstitial pulmonary congestion.  The patient was continued on his medicines and was diuresed with Lasix and his symptoms improved.  It was thought again that he would need to have mitral valve repair, but he is currently uninsured, and he is due to be insured on August 12, 2010.  He very much wished to delay further diagnostic testing and management until he is ensured.  He was seen in consultation by Dr. Cornelius Moras for consideration of mitral valve repair.  An echocardiogram showed preserved LV systolic function with an ejection fraction of 65-70%.  He had good contractility and had severe anteriorly direct mitral regurgitation.  He complained of some sharp chest pain lasting a few seconds anteriorly, but did not have anginal-type pain.  His EKG showed peaked T-waves, but was otherwise normal.  I elected to discharge him on furosemide 40 mg, and he will follow up with me in 1 week for office visit.  We will consider an early catheterization.  He is discharged at this time in improved condition after he was seen by Dr. Cornelius Moras.  He is contemplating whether to have surgery here or whether to have a surgery done at an outside tertiary medical center, and he has tried to come up with a decision that extensive period of time was spent discussing options for discharge with him.  He is discharged at this time in improved condition on Midrin as needed for headaches, metoprolol, Toprol-XL 100 mg daily, Cardizem CD 180 mg daily, Lipitor 10 mg daily, aspirin 325 mg daily, potassium gluconate 1 tablet daily.  He was given pneumococcal and pneumonia vaccinations on admission.  He is discharged on furosemide 40 mg daily. He is to follow up with me on August 12, 2010, and he is instructed to weigh daily.  He is to call if there are recurrent issues with shortness of breath,  chest pain, or other problems.  Dr. Cornelius Moras was to see the patient prior to discharge.     Georga Hacking, M.D.     WST/MEDQ  D:  08/06/2010  T:  08/07/2010  Job:  [id number]  cc:   Salvatore Decent. Cornelius Moras, M.D.  Electronically Signed by Lacretia Nicks. Donnie Aho M.D. on 08/30/2010 04:58:41 PM

## 2010-08-30 NOTE — H&P (Signed)
NAMEGARRIE, [name] NO.:  [id number]  MEDICAL RECORD NO.:  [id number]           PATIENT TYPE:  I  LOCATION:  4703                         FACILITY:  [facility]  PHYSICIAN:  Georga Hacking, M.D.DATE OF BIRTH:  1951-01-07  DATE OF ADMISSION:  08/11/2010                              HISTORY & PHYSICAL   REASON FOR ADMISSION:  Congestive heart failure, mitral valve disease.  HISTORY:  The patient is a 60 year old male with known mitral valve disease who has a previous history of hyperlipidemia and possible rheumatic fever.  He has had arrhythmias with PACs and PVCs through the years and has had a partially flail leaflet of the mitral valve for several years.  He has been asymptomatic, but over the past year he has had progressive enlargement of his atrium as well as significant mitral regurgitation and I have recommended that he have mitral valve replacement.  He has been hesitant to do this because of previous issues.  He had significant shoulder surgery, but was unemployed and looking for work and was stressed over the death of his girlfriend.  He was recently seen in January at which time he refused surgery and then he was admitted to the hospital a month ago with acute congestive heart failure.  He was found to have severe mitral regurgitation and he wished to try to put off having anything done until he became insured as of August 12, 2010.  I sent him home on Lasix a week ago and he had noticed some mild dyspnea and then last evening had the onset of some vague chest discomfort and then developed dyspnea and cough as well as some rapid heart beating and came to the emergency room.  BNP was elevated at 289, which was somewhat up from previous.  Potassium was 3.4.  He is now admitted to the hospital for treatment of congestive heart failure. Portable chest x-ray showed mild vascular congestion.  EKG showed him to be in sinus rhythm.  PAST MEDICAL HISTORY:   Remarkable for, 1. Sensorineural hearing loss. 2. Rheumatic fever. 3. Nephrolithiasis. 4. Varicose veins. 5. Esophageal reflux. 6. Hyperlipidemia.  PAST SURGERIES:  Hydrocele surgery, right inguinal herniorrhaphy, tonsillectomy, resection of ureter, repair of right shoulder.  ALLERGIES:  He is intolerant to ATENOLOL and to NADOLOL.  CURRENT MEDICATIONS: 1. Toprol-XL 100 mg daily. 2. Fish oil 1000 mg b.i.d. 3. Cardizem CD 180 mg daily. 4. Aspirin 325 mg daily. 5. Lipitor 10 mg daily. 6. Potassium gluconate daily. 7. Furosemide 40 mg daily.  SOCIAL HISTORY:  He is currently divorced, lives alone, and is disabled and looking for work.  He attends the [employer].  Used to smoke, but quit in 1996.  He was not using alcohol to excess.  FAMILY HISTORY:  Father died at age 68.  Mother died of Alzheimer disease at age 31.  Brother is living at age 48.  A sister is living at age 45 with hypertension.  REVIEW OF SYSTEMS:  He has significant sensorineural hearing loss.  He has had some malaise and fatigue recently.  He wears eyeglasses.  He has had  dyspnea as noted above.  He has not had any recent GI bleeding or other issues there.  He has significant varicose veins.  He has chronic low back pain and previous problems with the shoulder.  He has headaches for which he uses Midrin.  PHYSICAL EXAMINATION:  GENERAL:  He is a bearded pleasant male who is currently in no acute distress. VITAL SIGNS:  Blood pressure is 110/60, pulse is currently 80 and regular. SKIN:  Warm and dry. ENT:  EOMI.  PERRLA. CNS:  Clear. FUNDI:  Not examined. PHARYNX:  Negative. NECK:  Supple without masses, JVD, thyromegaly, or bruits. LUNGS:  Minimal crackles in the bases. CARDIOVASCULAR:  3-4/6 systolic murmur with some radiation up to the bases.  There is no diastolic murmur. ABDOMEN:  Soft and nontender. EXTREMITIES:  Varicose veins were noted in the right lower extremity. Peripheral pulses are  2+. NEUROLOGIC:  Normal cranial nerves.  Sensory and motor are intact.  EKG was normal.  Chest x-ray shows pulmonary congestion.  IMPRESSION: 1. Congestive heart failure with elevated BNP. 2. Severe mitral regurgitation with partially flail mitral valve     leaflet. 3. Sensorineural hearing loss. 4. Hyperlipidemia under treatment. 5. Congestive heart failure due to mitral valve disease. 6. History of supraventricular tachycardia and PACs.  RECOMMENDATIONS:  The patient has now been admitted twice with worsening heart failure.  He has had some vague chest pain and will have cardiac enzymes drawn.  Initial cardiac enzymes were negative.  He will have cardiac catheterization right and left.  He was seen by Dr. Cornelius Moras last week and we will contemplate mitral valve repair.     Georga Hacking, M.D.     WST/MEDQ  D:  08/11/2010  T:  08/12/2010  Job:  [id number]  cc:   Barry Dienes. Eloise Harman, M.D.  Electronically Signed by Lacretia Nicks. Donnie Aho M.D. on 08/30/2010 04:59:02 PM

## 2010-08-30 NOTE — Discharge Summary (Signed)
NAMEBRENTIN, [name] NO.:  [id number]  MEDICAL RECORD NO.:  [id number]           PATIENT TYPE:  I  LOCATION:  4703                         FACILITY:  [facility]  PHYSICIAN:  Georga Hacking, M.D.DATE OF BIRTH:  November 23, 1950  DATE OF ADMISSION:  08/11/2010 DATE OF DISCHARGE:  08/16/2010                              DISCHARGE SUMMARY   FINAL DIAGNOSES: 1. Acute congestive heart failure - resolved, systolic. 2. Severe mitral regurgitation with partially flail mitral leaflet 3. Sensorineural hearing loss. 4. Varicose veins 5. Hyperlipidemia under treatment. 6. History of reflux. 7. Nephrolithiasis.  CONSULTATIONS:  Dr. Tressie Stalker.  PROCEDURES:  Cardiac catheterization.  HISTORY:  This 60 year old male has a history of hyperlipidemia and some arrhythmias, PACs and PVCs.  He had recently been admitted for heart failure and was discharged home to consider having a repeat catheterization done as an outpatient, but he returned to the hospital with worsening shortness of breath with vague chest pain and rapid heart beating, came to the emergency room, BNP was 289 on admission and potassium is 3.48, and he was admitted to the hospital for treatment of congestive heart failure.  Please see the previously dictated history and physical for remainder of the details.  HOSPITAL COURSE:  Laboratory data on admission showed a normal CBC. Chemistry panel shows a potassium of 3.4, glucose of 110, BUN 22, creatinine 1.40, serial CPK and MB were negative, troponin was negative. B-natriuretic peptide was 289.  Chest x-ray showed mild vascular congestion.  The patient was readmitted to the hospital and given intravenous Lasix.  He was taken to the cardiac catheterization laboratory on August 12, 2010, with findings of severe mitral regurgitation.  He had mild coronary calcification, but minimal coronary artery disease.  Distal aortogram showed a smooth distal aorta with patent  renal arteries.  He had moderate to severe pulmonary hypertension with severe V-waves and normal left ventricular function.  He had previously been seen in consultation by Dr. Tressie Stalker, and the patient was agreeable to return for valve repair.  The surgery was to be arranged for August 19, 2010.  The patient was kept in the hospital following the catheterization because of elevated BNP and given intravenous Lasix, with this his BNP fell to 61.  His weight dropped from 82.8 kg, which is under 83 pounds down to 179 pounds with diuresis. He was feeling fine on the day of discharge and I felt that he could go home on a higher dose of Lasix, fluid restriction and returned for follow up with Dr. Cornelius Moras for surgery later this week.  He is discharged at this time in improved condition on amiodarone 200 mg b.i.d., potassium chloride 20 mEq b.i.d., furosemide 40 mg b.i.d., atorvastatin 10 mg daily, Cardizem CD 180 mg daily, metoprolol XL 100 mg daily.  He is to discontinue aspirin and Midrin.  He is to follow up with Dr. Cornelius Moras for surgery.  I will see him in the hospital following surgery and he is instructed to weight daily and to call if he develops recurrent shortness of breath.     Georga Hacking, M.D.  WST/MEDQ  D:  08/16/2010  T:  08/17/2010  Job:  [id number]  cc:   Barry Dienes. Eloise Harman, M.D.  Electronically Signed by Lacretia Nicks. Donnie Aho M.D. on 08/30/2010 04:58:48 PM

## 2010-09-03 ENCOUNTER — Other Ambulatory Visit: Payer: Self-pay | Admitting: Thoracic Surgery (Cardiothoracic Vascular Surgery)

## 2010-09-03 DIAGNOSIS — I059 Rheumatic mitral valve disease, unspecified: Secondary | ICD-10-CM

## 2010-09-06 ENCOUNTER — Encounter (INDEPENDENT_AMBULATORY_CARE_PROVIDER_SITE_OTHER): Payer: Self-pay

## 2010-09-06 ENCOUNTER — Ambulatory Visit
Admission: RE | Admit: 2010-09-06 | Discharge: 2010-09-06 | Disposition: A | Discharge status: Home / Self Care | Payer: PRIVATE HEALTH INSURANCE | Source: Ambulatory Visit | Attending: Thoracic Surgery (Cardiothoracic Vascular Surgery) | Admitting: Thoracic Surgery (Cardiothoracic Vascular Surgery)

## 2010-09-06 DIAGNOSIS — I059 Rheumatic mitral valve disease, unspecified: Secondary | ICD-10-CM

## 2010-09-07 ENCOUNTER — Inpatient Hospital Stay (HOSPITAL_COMMUNITY)
Admission: EM | Admit: 2010-09-07 | Discharge: 2010-09-10 | DRG: 310 | Disposition: A | Discharge status: Home / Self Care | Payer: PRIVATE HEALTH INSURANCE | Source: Ambulatory Visit | Attending: Cardiology | Admitting: Cardiology

## 2010-09-07 ENCOUNTER — Emergency Department (HOSPITAL_COMMUNITY): Payer: PRIVATE HEALTH INSURANCE

## 2010-09-07 DIAGNOSIS — R002 Palpitations: Secondary | ICD-10-CM

## 2010-09-07 DIAGNOSIS — E785 Hyperlipidemia, unspecified: Secondary | ICD-10-CM | POA: Diagnosis present

## 2010-09-07 DIAGNOSIS — I4891 Unspecified atrial fibrillation: Principal | ICD-10-CM | POA: Diagnosis present

## 2010-09-07 DIAGNOSIS — Z7982 Long term (current) use of aspirin: Secondary | ICD-10-CM

## 2010-09-07 DIAGNOSIS — Z7901 Long term (current) use of anticoagulants: Secondary | ICD-10-CM

## 2010-09-07 DIAGNOSIS — I509 Heart failure, unspecified: Secondary | ICD-10-CM | POA: Diagnosis present

## 2010-09-07 DIAGNOSIS — K219 Gastro-esophageal reflux disease without esophagitis: Secondary | ICD-10-CM | POA: Diagnosis present

## 2010-09-07 DIAGNOSIS — Z87891 Personal history of nicotine dependence: Secondary | ICD-10-CM

## 2010-09-07 DIAGNOSIS — E039 Hypothyroidism, unspecified: Secondary | ICD-10-CM | POA: Diagnosis not present

## 2010-09-07 LAB — COMPREHENSIVE METABOLIC PANEL
Albumin: 3.4 g/dL — ABNORMAL LOW (ref 3.5–5.2)
Alkaline Phosphatase: 117 U/L (ref 39–117)
BUN: 20 mg/dL (ref 6–23)
Creatinine, Ser: 1.3 mg/dL (ref 0.4–1.5)
Glucose, Bld: 103 mg/dL — ABNORMAL HIGH (ref 70–99)
Total Protein: 7.5 g/dL (ref 6.0–8.3)

## 2010-09-07 LAB — PROTIME-INR
INR: 2.56 — ABNORMAL HIGH (ref 0.00–1.49)
Prothrombin Time: 27.6 seconds — ABNORMAL HIGH (ref 11.6–15.2)

## 2010-09-07 LAB — CBC
Platelets: 520 10*3/uL — ABNORMAL HIGH (ref 150–400)
RBC: 3.93 MIL/uL — ABNORMAL LOW (ref 4.22–5.81)
RDW: 13.8 % (ref 11.5–15.5)
WBC: 8.7 10*3/uL (ref 4.0–10.5)

## 2010-09-07 LAB — POCT CARDIAC MARKERS
CKMB, poc: 1.8 ng/mL (ref 1.0–8.0)
Myoglobin, poc: 179 ng/mL (ref 12–200)
Troponin i, poc: <0.05 ng/mL (ref 0.00–0.09)

## 2010-09-07 NOTE — Assessment & Plan Note (Unsigned)
OFFICE VISIT  [name], SPRUCE DOB:  [birth year]                                        September 06, 2010 CHART #:  [id number]  HISTORY OF PRESENT ILLNESS:  The patient is status post right thoracotomy for mitral valve repair using a 32-mm Sorin Memo 3-D ring annuloplasty with quadrangular resection for posterior leaflet with sliding leaflet annuloplasty and chordal transposition.  This was done by Dr. Cornelius Moras on August 19, 2010.  The patient was started on Coumadin postoperatively.  He did have some postoperative atrial fibrillation, which was able to be converted back to normal sinus rhythm on Lopressor and amiodarone.  Amiodarone had to be discontinued secondary to loose stools.  The patient was discharged to home in normal sinus rhythm. Otherwise, the patient progressed well and was ultimately discharged to home with his sister.  He presents to the office today for his followup visit.  [name] states that he did contact our office over the weekend and spoke with Dr. Dorris Fetch due to swelling in his right foot and ankle.  The patient was not on any Lasix at this time and Dr. Dorris Fetch told him to start back on 40 mg of Lasix a day.  Since the patient has been back on the Lasix, he has noted 5-pound decrease as well as decrease in the swelling.  The patient also stated he noted last night some black lines in his nail beds.  He does not remember these lines before the surgery.  He also notes some pain at his right thigh incision.  He complains of some tingling and numbness which he states was there preoperatively following the cardiac catheterization.  The patient states he has been up ambulating well without difficulty.  His pain is well-controlled using the Ultram as needed.  He denies fevers, nausea, vomiting, opening of drainage from any of his incision sites. He is tolerating diet well.  He is sleeping well at night.  She denies any shortness of breath.   Home health nurse has checked his INR level once which was last week and he stated it was 2.4.  He stated since then they have not contacted him to come back out.  The patient has a follow up appointment to see Dr. Donnie Aho next Monday.  PHYSICAL EXAMINATION:  VITAL SIGNS:  Blood pressure 125/85, pulse of 80, respirations 16, O2 sats 98%, temperature of 97.7. RESPIRATORY:  Clear to auscultation bilaterally. CARDIAC:  Regular rate and rhythm.  No murmurs, rubs or gallops noted. ABDOMEN:  Benign. EXTREMITIES:  Warm and well-perfused.  Noted positive edema of bilateral lower extremities.  Positive petechiae around the patient's right ankle. Positive splinter hemorrhaging noted bilateral nails.  STUDIES:  The patient had PA and lateral chest x-ray obtained today which is stable.  There is noted to be improved aeration and decreased right basilar atelectasis.  IMPRESSION AND PLAN:  Mr. [name] is noted to be progressing well following right thoracotomy for mitral valve repair.  I did ask Dr. Dorris Fetch to come and evaluate the patient due to the petechiae in his right ankle and the splinter hemorrhaging on his nail beds.  Dr. Dorris Fetch recommended contacting Dr. Cornelius Moras.  Dr. Cornelius Moras was paged and I informed Dr. Cornelius Moras of the patient's findings.  He recommending checking blood cultures x2 today as well as checking an INR.  The  INR results were to be faxed to Dr. York Spaniel office.  He was informed that the patient does have an appointment to see Dr. Donnie Aho next Monday.  Dr. Cornelius Moras stated for the patient to keep that appointment with Dr. Donnie Aho for next Monday and at that point Dr. Donnie Aho can determine if an echocardiogram needs to be performed at that time.  The patient is instructed if he develops any fevers, nausea, vomiting, increased swelling he is to contact us and we will see him sooner.  Otherwise, the patient will come back in 1 month with repeat PA and lateral chest x-ray to see Dr. Cornelius Moras.   The patient is in agreement.  Sol Blazing, PA  KMD/MEDQ  D:  09/06/2010  T:  09/07/2010  Job:  [id number]  cc:   Georga Hacking, M.D.

## 2010-09-08 LAB — TSH: TSH: 8.627 u[IU]/mL — ABNORMAL HIGH (ref 0.350–4.500)

## 2010-09-08 LAB — BRAIN NATRIURETIC PEPTIDE: Pro B Natriuretic peptide (BNP): 186 pg/mL — ABNORMAL HIGH (ref 0.0–100.0)

## 2010-09-09 DIAGNOSIS — M79609 Pain in unspecified limb: Secondary | ICD-10-CM

## 2010-09-09 LAB — BASIC METABOLIC PANEL
BUN: 19 mg/dL (ref 6–23)
CO2: 27 mEq/L (ref 19–32)
Calcium: 9.3 mg/dL (ref 8.4–10.5)
GFR calc non Af Amer: >60 mL/min (ref >60)
Glucose, Bld: 98 mg/dL (ref 70–99)
Potassium: 4 mEq/L (ref 3.5–5.1)
Sodium: 140 mEq/L (ref 135–145)

## 2010-09-09 LAB — PROTIME-INR: Prothrombin Time: 29.2 seconds — ABNORMAL HIGH (ref 11.6–15.2)

## 2010-09-10 LAB — PROTIME-INR
INR: 2.72 — ABNORMAL HIGH (ref 0.00–1.49)
Prothrombin Time: 28.9 seconds — ABNORMAL HIGH (ref 11.6–15.2)

## 2010-09-15 NOTE — Discharge Summary (Signed)
  NAMESTOKES, RATTIGAN NO.:  [id number]  MEDICAL RECORD NO.:  [id number]           PATIENT TYPE:  LOCATION:                                 FACILITY:  PHYSICIAN:  Salvatore Decent. Cornelius Moras, M.D. DATE OF BIRTH:  [birth year]  DATE OF ADMISSION: DATE OF DISCHARGE:                              DISCHARGE SUMMARY   ADDENDUM  Mr. Pesqueira has remained stable and no changes have occurred since his previously dictated discharge summary.  He has continued to improve clinically and is now not significantly volume overloaded.  We have decreased the dosage of his Lasix to 40 mg daily.  Labs on postop day #11 showed PT of 23.9, INR 2.12, sodium 132, potassium 4.2, BUN 15, and creatinine 1.25.  At this time, his sister will be driving to [place] from [place] to stay with the patient for several days.  They have decided they would prefer him to go home rather than to skilled nursing facility.  He is currently stable and ready for discharge.  DISCHARGE MEDICATIONS: 1. Enteric-coated aspirin 81 mg daily. 2. Oxycodone IR 5-10 mg q.4-6 h. p.r.n. for pain. 3. Coumadin 5 mg daily or as directed. 4. Lasix 40 mg daily. 5. Potassium 20 mEq daily. 6. Toprol-XL 100 mg daily.  DISCHARGE INSTRUCTIONS AND FOLLOWUPS:  Unchanged from the previously dictated discharge summary.  He will need to see Dr. Donnie Aho for PT and INR within 48 hours from the time of discharge.     Coral Ceo, P.A.   ______________________________ Salvatore Decent. Cornelius Moras, M.D.    GC/MEDQ  D:  08/30/2010  T:  08/31/2010  Job:  [id number]  cc:   Georga Hacking, M.D. [facility]  Electronically Signed by Weldon Inches. on 09/10/2010 04:25:10 PM Electronically Signed by Tressie Stalker M.D. on 09/15/2010 06:00:06 PM

## 2010-09-24 NOTE — H&P (Signed)
NAMERAYJON, WERY NO.:  [id number]  MEDICAL RECORD NO.:  [id number]           PATIENT TYPE:  E  LOCATION:  [facility]                         FACILITY:  [facility]  PHYSICIAN:  Cherene Altes, MD     DATE OF BIRTH:  [birth year]  DATE OF ADMISSION:  08/05/2010 DATE OF DISCHARGE:                             HISTORY & PHYSICAL   CARDIOLOGIST:  Georga Hacking, MD  REASON FOR ADMISSION:  Acute shortness of breath.  HISTORY OF PRESENT ILLNESS:  [name] is a 60 year old white male with a past medical history of rheumatic heart disease with mitral valve prolapse with a chordal rupture identified, approximately 1 year ago. He states in an office visit in January, he was told again he had progression of his mitral regurgitation and the need for surgical intervention.  Due to concerns over losing his insurance at the end of January, he decided to postpone the surgery.  Today, while sleeping in a recliner at approximately 1:00 a.m., he woke with sudden dyspnea and cough as well as diaphoresis.  He states he has also had increased abdominal girth and bloating as well as dyspnea on exertion for the last several weeks.  EMS treated the patient with nitroglycerin and Lasix with significant improvement in his symptomatology.  On arrival in the emergency department, he was satting 92% on room air, though his oxygenation improved with the diuresis.  He states he has had  some occasional chest pressure with exertion as well though denies palpitations, syncope, near-syncope, or peripheral edema.  PAST MEDICAL HISTORY: 1. History of rheumatic heart disease with mitral valve prolapse and     chordal rupture with severe mitral regurgitation. 2. History of ventricular tachycardia treated with beta-blockers. 3. Hypertension. 4. History of unclear "skipped heart beat" that is treated with     calcium channel blocker.  SOCIAL HISTORY:  No tobacco, alcohol, or drugs.  FAMILY  HISTORY:  Mother with CVA and hypertension.  Father with CVA. Multiple siblings with hypertension.  No premature coronary disease.  REVIEW OF SYSTEMS:  Per HPI.  No fever, nausea, vomiting, diarrhea or urinary symptoms.  Otherwise, per HPI.  ALLERGIES:  No known drug allergies.  MEDICATIONS: 1. Cardizem 180 mg daily. 2. Potassium daily. 3. Aspirin 325 daily. 4. Toprol-XL 100 mg daily. 5. Midrin 1 tab as needed. 6. Atorvastatin 10 mg daily.  PHYSICAL EXAMINATION:  VITAL SIGNS:  Afebrile, pulse 86, respiratory rate 20, blood pressure 121/70, O2 sats 99% on 2 liters nasal cannula. GENERAL:  A pleasant, well-appearing white male, alert and oriented x3 in no acute distress. HEENT:  Normocephalic, atraumatic.  Extraocular movements intact. Mucous membranes moist. NECK:  Elevated jugular venous pressure.  No adenopathy. CARDIOVASCULAR:  Regular rate and rhythm, a 4/6 holosystolic murmur best heard over the apex. LUNGS:  Rales two-thirds of left back. SKIN:  No rash. ABDOMEN:  Soft, nontender, and nondistended with normoactive bowel sounds x4. EXTREMITIES:  Trace edema in bilateral lower extremities. MUSCULOSKELETAL:  No joint deformity or effusions.  RADIOLOGY:  Chest x-ray; mild pulmonary edema.  EKG; sinus rhythm with a rate of 82.  No ST or T wave changes.  LABORATORY DATA:  Normal white count and hemoglobin.  Normal electrolytes and renal function.  Cardiac enzymes are negative x1.  IMPRESSION AND PLAN: 1. Severe mitral regurgitation secondary to chordal rupture, mild     decompensation of pulmonary edema.  The patient states that he has     had progression of dyspnea and abdominal girth over the last     several weeks and did have acute onset of pulmonary edema when     sitting in a chair at approximately 1 a.m.  The patient has     significantly improved with IV Lasix and nitroglycerin.  On exam,     he continues to have a significant degree of rales __________ as      well as elevated jugular venous pressure.  For now, we are going to     admit the patient for continued diuresis and readdress the family     of the surgery which was deferred due to constraints for insurance. 2. History of ventricular tachycardia.  Continue beta-blocker. 3. Hypertension.  Continue home meds.     Cherene Altes, MD     PS/MEDQ  D:  08/05/2010  T:  08/05/2010  Job:  [id number]  Electronically Signed by Cherene Altes M.D. on 09/24/2010 08:19:33 PM

## 2010-09-28 LAB — BASIC METABOLIC PANEL
Calcium: 9.4 mg/dL (ref 8.4–10.5)
GFR calc Af Amer: >60 mL/min (ref >60)
GFR calc non Af Amer: >60 mL/min (ref >60)
Sodium: 137 mEq/L (ref 135–145)

## 2010-09-29 NOTE — Discharge Summary (Signed)
NAMEDETRICK, DANI NO.:  [id number]  MEDICAL RECORD NO.:  [id number]           PATIENT TYPE:  I  LOCATION:  2902                         FACILITY:  [facility]  PHYSICIAN:  Georga Hacking, M.D.DATE OF BIRTH:  [birth year]  DATE OF ADMISSION:  09/07/2010 DATE OF DISCHARGE:  09/10/2010                              DISCHARGE SUMMARY   FINAL DIAGNOSES: 1. Postoperative atrial fibrillation, 3 weeks following mitral valve     repair. 2. Recent mitral valve repair with placement of ring on August 19, 2010. 3. History of congestive heart failure with preserved ejection     fraction. 4. Hyperlipidemia. 5. Esophageal reflux. 6. New diagnosis of hypothyroidism.  PROCEDURES:  Echocardiogram and venous Doppler.  HISTORY:  This 60 year old male has a history of mitral valve prolapse and had severe MR with repair done on August 19, 2010.  He had some mild postoperative atrial fibrillation, was taken off amiodarone because of questionable GI side effects.  He had been doing well at home and recovering from surgery and had some mild lower extremity edema over the weekend for which he took an extra Lasix.  He developed palpitations the evening of admission and came to the emergency room.  Because he was found to be in rapid atrial fibrillation, he was started on diltiazem which resulted in hypotension and was later switched to amiodarone.  He was therapeutic on warfarin and was admitted for further evaluation. Please see the previously dictated history and physical for remainder of the details.  HOSPITAL COURSE:  Lab data on admission showed a hemoglobin of 11.1, hematocrit of 34, platelet count of 520,000, INR was 2.56, and remained therapeutic while he was in.  Chemistry panel was normal.  B natriuretic peptide was 186 and 169.  TSH was elevated at 8.627.  Chest x-ray portable showed atelectasis or scarring at the right lung base.  EKG showed rapid atrial  fibrillation.  The patient was brought in and his diltiazem was held.  He was later switched to p.o. amiodarone and converted to normal sinus rhythm on the evening of September 08, 2010.  He remained in sinus rhythm the next day. He had an echocardiogram that showed a good result on the mitral valve repair and good LV function.  He complained of some pain and redness of his right lower extremity where he had the previous cannulation and a set of venous Dopplers did not show any difficulty with thrombus.  He had no recurrence of atrial fibrillation.  We found him to be hypothyroid and initiated levothyroxine 0.05 mg daily, but he refused to take this the night prior to talking to the doctor about it.  He is discharged at this time in improved condition.  DISCHARGE MEDICATIONS: 1. Coumadin 5 mg daily. 2. Toprol-XL dispenses written 50 mg twice daily. 3. Levothyroxine 50 mcg daily. 4. Amiodarone 200 mg twice daily. 5. Furosemide 40 mg daily. 6. Klor-Con 8 mEq daily. 7. Tramadol as needed for pain. 8. Aspirin 81 mg daily.  He is to follow up with me in 10 days and is to have a repeat protime next Thursday.  Georga Hacking, M.D.     WST/MEDQ  D:  09/10/2010  T:  09/11/2010  Job:  [id number]  cc:   Barry Dienes. Eloise Harman, M.D. Dr. Durward Mallard  Electronically Signed by Lacretia Nicks. Donnie Aho M.D. on 09/29/2010 02:04:25 PM

## 2010-10-01 ENCOUNTER — Other Ambulatory Visit: Payer: Self-pay | Admitting: Thoracic Surgery (Cardiothoracic Vascular Surgery)

## 2010-10-01 DIAGNOSIS — I059 Rheumatic mitral valve disease, unspecified: Secondary | ICD-10-CM

## 2010-10-04 ENCOUNTER — Ambulatory Visit
Admission: RE | Admit: 2010-10-04 | Discharge: 2010-10-04 | Disposition: A | Discharge status: Home / Self Care | Payer: PRIVATE HEALTH INSURANCE | Source: Ambulatory Visit | Attending: Thoracic Surgery (Cardiothoracic Vascular Surgery) | Admitting: Thoracic Surgery (Cardiothoracic Vascular Surgery)

## 2010-10-04 ENCOUNTER — Encounter (INDEPENDENT_AMBULATORY_CARE_PROVIDER_SITE_OTHER): Payer: Self-pay | Admitting: Thoracic Surgery (Cardiothoracic Vascular Surgery)

## 2010-10-04 DIAGNOSIS — I059 Rheumatic mitral valve disease, unspecified: Secondary | ICD-10-CM

## 2010-10-04 NOTE — Assessment & Plan Note (Signed)
OFFICE VISIT  [name], [name] DOB:  June 20, 1950                                        October 04, 2010 CHART #:  [id number]  HISTORY OF PRESENT ILLNESS:  The patient returns for routine followup, status post right miniature thoracotomy for mitral valve repair on August 19, 2010.  His initial postoperative recovery was essentially uncomplicated, although he did have some postoperative atrial fibrillation treated with Lopressor and amiodarone.  He was last seen here in the office on September 06, 2010, at which time, he was noted to have some increased fluid overload with lower extremity edema more so on the right than the left.  He was restarted on Lasix at that time.  He apparently also was readmitted to the hospital with atrial fibrillation shortly after that.  A followup echocardiogram was performed on September 08, 2010, confirming intact mitral valve repair with normal left ventricular function.  No other significant abnormalities were noted. Since then, the patient has continued to gradually improve.  He has been followed carefully by Dr. Donnie Aho who has been managing his medical therapy.  The patient reports that overall he seems to be getting along fairly well.  He has some minor aches and pains here and there, but overall minimal residual soreness in the chest.  His exercise tolerance is slowly improving.  He is eager to start doing more physically.  He has not had any significant shortness of breath.  He has not had any further tachy palpitations.  Lower extremity edema has improved markedly on Lasix therapy.  He does plan to start the cardiac rehab program sometime next week.  The remainder of his review of systems is unremarkable.  CURRENT MEDICATIONS:  Aspirin, Coumadin, Lasix, potassium, Toprol XL, amiodarone, levothyroxine and tramadol.  PHYSICAL EXAMINATION:  GENERAL:  Well-appearing male. VITAL SIGNS:  Blood pressure 134/90, pulse 76 and regular,  oxygen saturation 96% on room air. CHEST:  Examination of the chest reveals a mini thoracotomy incision that is healing nicely.  Auscultation reveals clear breath sounds that are symmetrical bilaterally.  No wheezes, rales or rhonchi are noted. CARDIOVASCULAR:  Regular rate and rhythm.  No murmurs, rubs or gallops are appreciated. ABDOMEN:  Soft and nontender.  The right groin incision has healed nicely. EXTREMITIES:  There is no lower extremity edema.  The remainder of his physical exam is unremarkable.  DIAGNOSTIC TESTS:  Chest x-ray performed today at the [facility] is reviewed.  This demonstrates clear lung fields bilaterally. There are no pleural effusions.  There is trivial atelectasis at the right lung base.  IMPRESSION:  Excellent progress following mitral valve repair.  PLAN:  I have encouraged the patient to continue to gradually increase his physical activity as tolerated.  He had no specific physical limitations at this time, but I have cautioned him to gradually increase what he does on a daily basis and not to do any real heavy lifting on this for the time being.  I have encouraged him to get involved in the cardiac rehab program and all of his questions have been addressed.  I would think it would be reasonable to consider stopping amiodarone at some point within the next month or so.  If he remains stable off amiodarone, he could probably stop Coumadin sometime after that.  All of his questions have been addressed.  We will plan to see him back in 2 months' time.  Salvatore Decent. Cornelius Moras, M.D. Electronically Signed  CHO/MEDQ  D:  10/04/2010  T:  10/04/2010  Job:  [id number]  cc:   Georga Hacking, M.D. Barry Dienes Eloise Harman, M.D.

## 2010-10-10 NOTE — H&P (Signed)
[name] NO.:  [id number]  MEDICAL RECORD NO.:  [id number]           PATIENT TYPE:  E  LOCATION:  [facility]                         FACILITY:  [facility]  PHYSICIAN:  Wendi Snipes, MD DATE OF BIRTH:  1950/11/03  DATE OF ADMISSION:  09/08/2010 DATE OF DISCHARGE:                             HISTORY & PHYSICAL   CARDIOLOGIST:  Georga Hacking, MD  CHIEF COMPLAINT:  Palpitations.  HISTORY OF PRESENT ILLNESS:  This is a 60 year old white male with a history of mitral valve prolapse, status post repair on August 19, 2010, complicated by postoperative atrial fibrillation here with palpitations that started approximately 7:30 p.m.  He had been doing well at home and recovering from his surgery.  He only reports some increased lower extremity edema over the weekend for which he took an extra Lasix and that has since resolved.  He is now on his dry weight.  Otherwise, he reports mild shortness of breath and sweatiness after he felt palpitations began at approximately 7:30 p.m.  At this time, he measured his heart rate and it was approximately 150 beats per minute.  He called his cardiothoracic surgeon who recommended waiting for 1 hour to see if his palpitations resolved and if not then he was to report to the emergency department.  In the emergency department, he was found to be in rapid atrial fibrillation.  His blood pressure has been stable, although he was started on Cardizem drip and his blood pressure dropped into the 70s.  His blood pressure did recover with a fluid bolus and he has been asymptomatic since then.  He otherwise denies chest pain, syncope, presyncope, paroxysmal nocturnal dyspnea, or orthopnea.  PAST MEDICAL HISTORY: 1. Mitral valve prolapse with severe mitral regurgitation, status post     repair on August 19, 2010. 2. Postoperative atrial fibrillation. 3. History of congestive heart failure with preserved ejection     fraction. 4.  Hyperlipidemia. 5. GERD. 6. Hearing loss. 7. History of rheumatic fever.  ALLERGIES:  No known drug allergies.  MEDICATIONS ON ADMISSION: 1. Tramadol 50 mg 1-2 tablets every 4 hours as needed. 2. Aspirin 81 mg daily. 3. Warfarin 5 mg 1 tablet daily. 4. Toprol-XL 100 mg daily. 5. Potassium chloride 8 mEq daily. 6. Lasix 40 mg daily.  SOCIAL HISTORY:  He lives in [place] alone.  He is unemployed.  He is divorced.  He quit smoking in 1996.  FAMILY HISTORY:  Negative for premature coronary artery disease.  REVIEW OF SYSTEMS:  All 14 systems were reviewed and were negative except as mentioned in detail in the HPI.  PHYSICAL EXAMINATION:  VITAL SIGNS:  Blood pressure is 85/57, respiratory rate 16, and pulse is 115.  He is saturating 98% on room air. GENERAL:  He is a 60 year old white male appearing his stated age, in no acute distress. HEENT:  Moist mucous membranes.  Pupils are equal, round, and reactive to light and accommodation.  Anicteric sclera. NECK:  No jugular venous distention.  No thyromegaly. CARDIOVASCULAR:  Irregularly irregular.  No murmurs, rubs, or gallops. LUNGS:  Clear to auscultation bilaterally. ABDOMEN:  Nontender and nondistended.  Positive bowel sounds.  No masses. EXTREMITIES:  1+ pitting edema on the right ankle. NEUROLOGIC:  Alert and oriented x3.  Cranial nerves II-XII grossly intact.  No focal logic deficits. SKIN:  Warm, dry, and intact.  He had mild petechia on his right ankle which he has seen his surgeon for. PSYCH:  Mood and affect are appropriate.  RADIOLOGY:  No acute cardiopulmonary process.  EKG showed atrial fibrillation with a rate of 129 beats per minute with nonspecific ST abnormalities.  LABORATORY DATA:  White cell count is 8.7, hematocrit 34, and potassium is 4.8.  Creatinine is 1.3.  Troponin is less than 0.05.  ASSESSMENT/PLAN:  This is a 60 year old white male with a recent history of mitral valve repair, here with atrial  fibrillation with rapid ventricular response. 1. Rapid atrial fibrillation:  His blood pressure did not tolerate     diltiazem at this time.  He does report amiodarone during his     hospital stay, although it was discontinued with some suspicion     that it caused GI upset.  However, the patient does not believe     this to be the case.  We will start amiodarone tonight in context     of low blood pressures with diltiazem.  If he is still in atrial     fibrillation in the morning, we will plan for a cardioversion as     his symptoms were reliably started at approximately 7:30 p.m. this     evening.  He is therapeutic with Coumadin and we will continue this     as he is likely to have at least paroxysmal atrial fibrillation.     His p.o. Cardizem was discontinued postoperatively and this may be     an alternative to starting an antiarrhythmic therapy for outpatient     management of his atrial fibrillation. 2. Congestive heart failure:  He is currently on his dry weight.  We     will continue his Lasix after his blood pressure recovers.     Wendi Snipes, MD     BHH/MEDQ  D:  09/08/2010  T:  09/08/2010  Job:  [id number]  Electronically Signed by Jim Desanctis MD on 10/10/2010 06:52:53 PM

## 2010-10-11 ENCOUNTER — Encounter (HOSPITAL_COMMUNITY): Payer: PRIVATE HEALTH INSURANCE

## 2010-10-13 ENCOUNTER — Encounter (HOSPITAL_COMMUNITY): Payer: PRIVATE HEALTH INSURANCE

## 2010-10-15 ENCOUNTER — Encounter (HOSPITAL_COMMUNITY): Payer: PRIVATE HEALTH INSURANCE

## 2010-10-18 ENCOUNTER — Encounter (HOSPITAL_COMMUNITY): Payer: PRIVATE HEALTH INSURANCE | Attending: Cardiology

## 2010-10-18 DIAGNOSIS — E785 Hyperlipidemia, unspecified: Secondary | ICD-10-CM | POA: Insufficient documentation

## 2010-10-18 DIAGNOSIS — Z87891 Personal history of nicotine dependence: Secondary | ICD-10-CM | POA: Insufficient documentation

## 2010-10-18 DIAGNOSIS — Z9889 Other specified postprocedural states: Secondary | ICD-10-CM | POA: Insufficient documentation

## 2010-10-18 DIAGNOSIS — I509 Heart failure, unspecified: Secondary | ICD-10-CM | POA: Insufficient documentation

## 2010-10-18 DIAGNOSIS — Z7901 Long term (current) use of anticoagulants: Secondary | ICD-10-CM | POA: Insufficient documentation

## 2010-10-18 DIAGNOSIS — Z5189 Encounter for other specified aftercare: Secondary | ICD-10-CM | POA: Insufficient documentation

## 2010-10-18 DIAGNOSIS — K219 Gastro-esophageal reflux disease without esophagitis: Secondary | ICD-10-CM | POA: Insufficient documentation

## 2010-10-18 DIAGNOSIS — I059 Rheumatic mitral valve disease, unspecified: Secondary | ICD-10-CM | POA: Insufficient documentation

## 2010-10-18 DIAGNOSIS — Z7982 Long term (current) use of aspirin: Secondary | ICD-10-CM | POA: Insufficient documentation

## 2010-10-18 DIAGNOSIS — I5022 Chronic systolic (congestive) heart failure: Secondary | ICD-10-CM | POA: Insufficient documentation

## 2010-10-20 ENCOUNTER — Encounter (HOSPITAL_COMMUNITY): Payer: PRIVATE HEALTH INSURANCE

## 2010-10-22 ENCOUNTER — Encounter (HOSPITAL_COMMUNITY): Payer: PRIVATE HEALTH INSURANCE

## 2010-10-25 ENCOUNTER — Encounter (HOSPITAL_COMMUNITY): Payer: PRIVATE HEALTH INSURANCE

## 2010-10-26 NOTE — Op Note (Signed)
[name], [name]                ACCOUNT NO.:  [id number]   MEDICAL RECORD NO.:  [id number]          PATIENT TYPE:  AMB   LOCATION:  [facility]                          FACILITY:  [facility]   PHYSICIAN:  Harvie Junior, M.D.   DATE OF BIRTH:  [birth year]   DATE OF PROCEDURE:  07/18/2008  DATE OF DISCHARGE:                               OPERATIVE REPORT   PREOPERATIVE DIAGNOSES:  Chronic biceps tendon tear with suspected  rotator cuff tear with impingement acromioclavicular joint arthritis.   POSTOPERATIVE DIAGNOSES:  1. Chronic biceps tendon tear.  2. Suspected rotator cuff tear.  3. Impingement.  4. Acromioclavicular joint arthritis.   PROCEDURES:  1. Arthroscopic subacromial decompression.  2. Arthroscopic distal clavicle resection.  3. Arthroscopic debridement of the superior labrum and stump of biceps      tendon within the glenohumeral joint.  4. Mini-open rotator cuff repair of an acutely torn rotator cuff.   SURGEON:  Harvie Junior, MD.   ASSISTANT:  Marshia Ly, P.A.   ANESTHESIA:  General.   HISTORY:  [name] is a 60 year old male with a long history of  having had pain in the right shoulder.  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  He ruptured his biceps some period back and was having a pain  off and on.  Continued to experience pain, at that point, he came to  visit [place].  He had obvious biceps tendon rupture, but MRI was obtained,  showed that he had a questionably high-grade rotator cuff tear and  impingement AC joint arthritis.  We talked about treatment options and  ultimately felt that given the amount of pain that he had, failure of  conservative care and injection therapy, we felt that arthroscopic  intervention is the most appropriate course of action and he was brought  to the operating room for this procedure.   PROCEDURE:  The patient was brought to the operating room.  After  adequate  anesthesia obtained with general anesthetic, the patient was  placed supine on the operating table.  The right arm was then prepped  and draped in usual sterile fashion.  Following this, routine  arthroscopic examination of the shoulder revealed that there was obvious  tear of the biceps tendon, the stump, and the superior labrum were torn  but still intact.  These were debrided back to a smooth and stable rim.  Attention was then turned to the rotator cuff on the undersurface, he  had about 75%-85% tear on the coronal plane of the rotator cuff.  We  actually used a spinal needle from outside end and then put a shaver.  We could put the shaver right through the cuff.  Identified the areas of  rotator cuff tear, which is posterior to the biceps tendon.  The  remaining of the glenohumeral joint examination showed there was no  evidence of loose bodies, no significant cartilage injury in the ball  and socket joint.  Attention was turned out of the glenohumeral joint  and  the subacromial space.  Anterolateral acromioplasty was performed  for lateral posterior compartment.  Distal clavicle resection over 18 mm  from the anterior compartment and then the rotator cuff was evaluated  from the top side.  You could feel the thinned area and at this point,  we felt that probably opening was the most appropriate course of action,  so we made sure we could find the bicipital groove and we were just  behind that where this area was.  So at this point, a small incision was  made, deltoid was dissected and the rotator cuff was identified just  posterior to the groove.  Other than thinness of the rotator cuff, was  clear on palpation.  At this point, we elevated a flap of rotator cuff,  roughened up the bone below this, then ultimately locked this back down  in place with a 5.5 corkscrew style of the anchor.  Once the cuff had  been tied down, a double-row repair was essentially done by taking the  more  lateral portion of the cuff and using these 4 stitches and tying  them laterally.  Once this was done, the arm was put through range of  motion, easy full range of motion.  The finger could palpate the  acromioplasty which was done well and palpated distal clavicle  resection, which was done well.  At this point, the shoulder was  copiously and thoroughly irrigated and suctioned dry.  The deltoid was  closed with a 1 Vicryl running.  The skin with 0 and 2-0 Vicryl and 3-0  subcuticular stitch with Benzoin and Steri-Strips were applied and the  patient taken to the recovery room and was noted to be in satisfactory  condition.  Estimated blood loss for this procedure was none.      Harvie Junior, M.D.  Electronically Signed     JLG/MEDQ  D:  07/18/2008  T:  07/19/2008  Job:  [id number]

## 2010-10-26 NOTE — Letter (Signed)
December 18, 2006    W. Viann Fish, M.D.  [address], [address]  [place], [place] [address]   RE:  [name], [name]  MRN:  [id number]  /  DOB:  Aug 06, 1950   Dear Karleen Hampshire:   It was a pleasure to see [name] today at your request because of  palpitations.   As you know, he is a 60 year old gentleman who has a long-standing  history of palpitations. I first met in consultation about six years  ago. At that time, he was found to have adenosine-responsive SVT. He has  also had PCVs and PACs.   Because of persistent and disabling symptoms, I saw him again in March  of 2003. At that time, we adjusted his medications from Toprol twice a  day to Toprol in the morning and Cardizem in the evening. Somewhat  surprisingly but quite pleasantly, he was symptom free for about five  years.   Over the last month, he has had increasing symptoms. He temporarily  relates it to the change in formulation in a calcium blocker from the  Cartia to something new. There have been a variety of adjustments in the  last couple of months to address the increasing frequency of his  arrhythmia including taking Cartia twice a day, increasing his beta  blocker, and these have been complicated by increasing side effects and  hypotension.   On one occasion, he did go to the fire station where they hooked him up  and documented at least one what he recalls you telling him was a PAC.  He mentions that the event recorder that you placed in your office a  couple of weeks ago also demonstrated PACs.   On his current regimen of 100 in the morning and 50 at night and his  Cardizem taken also in the evening, he is better but not where he would  like to be.   It is interesting that most of his symptoms begin in the late afternoon  and evening and occasionally persist into the late evening.   His cardiac history is notable for rheumatic fever. He has a history of  mitral valve prolapse and mitral regurgitation  which apparently by his  report have been stable over years.   A recent adenosine Myoview in your office was normal.   His past medical history in addition to the above is notable for:  1. Migraine headaches.  2. 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  3. Arthritis.   His review of systems in addition to the above is notable for:  1. GE reflux disease.  2. Back problems.  3. Depression.   SOCIAL HISTORY:  He is divorced. He has no children. He works at [employer].  He does not use cigarettes, alcohol or recreational drugs.   His current medications include:  1. Simvastatin 40.  2. Relafen.  3. Cartia XT 180.  4. Toprol 100/50.   He has no known drug allergies, although your note labels that he is  intolerant of Coreg and atenolol.   His past surgical history is notable for:  1. Hydrocele.  2. Inguinal herniorrhaphy.  3. Tonsillectomy.  4. Ureteral resection.   On examination, he is a middle-aged, Caucasian male appearing somewhat  older than his stated age of 70. His blood pressure is 125/82. His pulse  is 62. His weight was 175. His height was 5 feet 8 inches.  His HEENT exam demonstrated no  icterus, no xanthomata. He was wearing  hearing aids bilaterally.  The neck was without JVD, and his carotids were brisk and full.  The back was without kyphosis or scoliosis.  His lungs were clear.  Heart sounds were regular with a high-pitched 2 to 3/6 systolic murmur  heard along the left sternal border and radiating out towards the apex.  The abdomen was soft with active bowel sounds without midline pulsation  or hepatomegaly. Femoral pulses were 2+. Distal pulses were intact.  There was no clubbing, cyanosis, or edema. Neurological exam was grossly  normal apart from the hearing.   Electrocardiogram dated today demonstrates sinus rhythm at 62 with  intervals of 0.16/0.09/0.43; the axis was 37 degrees. There is prominent  T waves, early  repolarization and voltage criteria for LV.   IMPRESSION:  1. Palpitations with documented premature atrial contractions.  2. Mitral valve prolapse with mitral regurgitation, apparently stable.  3. Recent negative Myoview.  4. Hypertension with electrocardiographic evidence of left ventricular      hypertrophy.   Karleen Hampshire, [name] has quite symptomatic PACs. What a blessing for him  that he had five years of relative quiescence. While he attributes his  symptoms to a change in the Cardizem formulation, I do not know how  important that is, although these things certainly do happen. He  certainly did not tolerate the generic Toprol either.   We discussed a variety of treatment options including:  1. Leaving things where they were and see how they settle down.  2. Trying verapamil as an alternative calcium blocker and trying to      find drugs that we can maintain a single formulation.  3. Antiarrhythmic drug therapy, specifically 1C antiarrhythmic      justified in the context of a recent negative Myoview.  4. Catheter ablation, the value of which and the risk and benefits of      which would be largely related to the location of the ectopy.   We discussed the potential proarrythmic risks of flecainide. He would  like to avoid that course or the ablation course at the present time,  and so we have settled on trying a verapamil formulation. I have given  him prescriptions for Calan 180 and Isoptin 180. Another option would be  Covera to see if either of these is sufficient to mitigate symptoms. The  other thing that is notable is the afternoon onset. To that end, I  suggest that he think about taking his medications at noon to see if  that does not help also.   I have tentatively set him up to come back and see me in four weeks if  you and he feel that there may be some value in that.   Thank you very much for asking [place] to see him.    Sincerely,      Duke Salvia, MD,  [facility]  Electronically Signed    SCK/MedQ  DD: 12/18/2006  DT: 12/18/2006  Job #: [id number]   CC:    Barry Dienes. Eloise Harman, M.D.

## 2010-10-26 NOTE — Assessment & Plan Note (Signed)
[facility]                         GASTROENTEROLOGY OFFICE NOTE   NAME:[name] [name] CAREY                       MRN:          [id number]  DATE:01/01/2007                            DOB:          10-23-1950    REASON FOR REFERRAL:  Hematochezia and chronic diarrhea.   HISTORY OF PRESENT ILLNESS  This is a 60 year old white male who relates intermittent problems with  loose stools and intermittent small volume hematochezia. Both symptoms  have been bothersome for several years. He states that he had a barium  enema performed several years ago at [facility] which was  negative. He had recently lost about 9 pounds for unclear reasons and he  has gained back about 4 to 5 pounds. He has had problems with heartburn  and indigestion in the past, but none recently. He has a history of  palpitations and has been evaluated by Dr. Viann Fish and Dr.  Hurman Horn. There is no family history of colon cancer, colon polyps,  or inflammatory bowel disease. He states that he had a sigmoidoscopy  performed in the past as well.   PAST MEDICAL HISTORY:  Hypertension; migraine headaches, hearing loss  related to streptomycin; arthritis; premature atrial contractions;  mitral valve prolapse with mitral regurgitation; history of  nephrolithiasis; status post hydrocele repair with ureteral resection  and reimplantation; status post inguinal hernia repair.   CURRENT MEDICATIONS:  Listed on the chart, updated and reviewed.   MEDICATION ALLERGIES:  None known.   SOCIAL HISTORY:  Per the handwritten form.   REVIEW OF SYSTEMS:  Per the handwritten form.   PHYSICAL EXAMINATION:  GENERAL:  No acute distress.  VITAL SIGNS:  Height 5 feet 8 inches, weight 177.8 pounds, blood  pressure 134/74, pulse 64 and regular.  HEENT:  Anicteric sclerae, oropharynx clear.  CHEST:  Clear to auscultation bilaterally.  CARDIAC:  2/6 systolic murmur, regular rate and rhythm.  ABDOMEN:  Soft and nontender, nondistended, normoactive bowel sounds, no  palpable organomegaly, masses, or hernias.  RECTAL:  Deferred to time of colonoscopy, a recent examination by Dr.  Jarold Motto revealed internal hemorrhoids, heme negative stool, and no  other lesions.  EXTREMITIES:  Without cyanosis, clubbing, or edema.  NEUROLOGIC:  Awake, alert, and oriented x3, grossly non-focal.   ASSESSMENT AND PLAN:  Chronic intermittent hematochezia and intermittent  diarrhea. Hemorrhoids on recent exam. Rule out colorectal neoplasms,  proctitis, and other disorders. Risk, benefits, and alternatives to  colonoscopy, possible biopsy, possible polypectomy, and possible  destruction of internal hemorrhoids discussed with the patient and he  consents to proceed. This will be scheduled electively.     Venita Lick. Russella Dar, MD, [facility]  Electronically Signed    MTS/MedQ  DD: 01/07/2007  DT: 01/08/2007  Job #: [id number]   cc:   Barry Dienes. Eloise Harman, M.D.

## 2010-10-27 ENCOUNTER — Encounter (HOSPITAL_COMMUNITY): Payer: PRIVATE HEALTH INSURANCE

## 2010-10-29 ENCOUNTER — Encounter (HOSPITAL_COMMUNITY): Payer: PRIVATE HEALTH INSURANCE

## 2010-10-29 NOTE — H&P (Signed)
[facility]  Patient:    [name], [name]                       MRN: [id number] Adm. Date:  [id number] Attending:  Lorre Nick CC:         Darden Palmer., M.D.  Nathen May, M.D., [facility]   History and Physical  CHIEF COMPLAINT:  Epigastric pain.  HISTORY OF PRESENT ILLNESS:  The patient is a 60 year old white male who complains of three days and three nights of intermittent complications. He was seen in the [facility] Emergency Room on August 04, 2000 and his cardiologist increased his Toprol XL to 200 mg daily.  He continues to have occasional palpitations associated with mild light headedness but no syncope. In the past few nights, he has tended to awaken suddenly with associated symptoms of a flushing sensation, paresthesias in all extremities, palpitations and mild shortness of breath.  Last night he had multiple episodes of such symptoms associated with short lived epigastric pain.  He presented to the [facility] Emergency Room for evaluation at 0400 hours.  He has been closely followed by his cardiologist who has done a Cardiolite exercise test in October 2000 with event monitoring that has not shown significant arrhythmia.  PAST MEDICAL HISTORY:  Significant for mitral valve prolapse, an October 2000 admission for chest pain where serial cardiac isoenzymes were normal and a Cardiolite exercise test was normal, premature ventricular contractions, hypertension, hyperlipidemia, bilateral sensory neural hearing loss.  MEDICATIONS PRIOR TO ADMISSION:  Toprol XL 200 mg p.o. q.d.  Aspirin 325 mg p.o. b.i.d.  Multivitamin.  ALLERGIES:  No known drug allergies.  PAST SURGICAL HISTORY:  At age 17 years he had an operation on his ureter.  At age 89 years he had a hydrocele repair.  He has had inguinal hernia repair.  FAMILY HISTORY:  Father is 75 years and alive and well.  Mother is 25 years and has hypertension and  coronary artery disease.  He has a brother who is alive and well and a sister who is alive and well.  There is no family history of diabetes mellitus, or premature coronary artery disease.  SOCIAL HISTORY:  He is married but has been separated from his wife for approximately a year.  He has no children. He has no history of tobacco or alcohol abuse.  REVIEW OF SYSTEMS:  Significant for having intermittent epigastric pain, possible gastroesophageal reflux disease, recent symptoms of rhinitis with dry cough, intermittent palpitations, occasional small amounts of bright red blood per rectum suggestive of hemorrhoidal bleeding, chronic decreased hearing for which he wears hearing aids, and some snoring when he sleeps. He denies headache, fever, or current symptoms of shortness of breath or chest discomfort.  PHYSICAL EXAMINATION:  VITAL SIGNS:  Blood pressure 176/101, pulse 67, respiratory rate 20, temperature 96.4.  GENERAL:  He is a well-nourished, well-developed, white male in no apparent distress, alert and oriented times three.  HEENT:  Within normal limits.  NECK:  Supple with full range of motion.  There was no jugular venous distention or carotid bruit.  CHEST:  Clear.  HEART:  Regular rate and rhythm.  S1 and S2 without murmur, gallop or rub.  ABDOMEN:  Normal bowel sounds with no hepatosplenomegaly or tenderness.  RECTAL:  He had normal sphincter tone with no stool in the vault and his prostate exam was within normal limits.  EXTREMITIES:  Without cyanosis,  clubbing or edema.  NEUROLOGIC:  Nonfocal.  LABORATORY DATA:  EKG:  (1) Normal sinus rhythm.  (2) Early repolarization changes.  White blood cell count was 7.0.  Hemoglobin 13, hematocrit 39, platelet count 257.  Serum sodium was 138, potassium 3.7, chloride 103, CO2 27, BUN 17, creatinine 1.0, glucose 94.  Chest x-ray showed no acute cardiopulmonary disease.  Initial CPK was 80.  Troponin I level was  0.2.  IMPRESSION/PLAN: 1. Epigastric pain. This is likely secondary to mild gastritis versus peptic    ulcer disease.  I doubt it is due to coronary artery disease given his    extensive evaluation in the past and his atypical symptoms.  I plan to    check hemoccult test as an inpatient and an H. pylori test.  We will check    serial CPK levels and add Norvasc to his regimen to help protect against    angina. 2. Palpitations.  These are benign by previous workup by his cardiologist.    He will be on telemetry while an inpatient. 3. Paresthesias and anxiety.  Unclear if he has a panic disorder versus    obstructive sleep apnea.  Plan to monitor his nocturnal pulse oximetry,    oxygen oximetry while he is an inpatient and consider a formal sleep    study.  He will be started on Paxil while an inpatient. DD:  08/15/00 TD:  08/15/00 Job: [id number]

## 2010-10-29 NOTE — Consult Note (Signed)
[place]. [facility]  Patient:    [name], [name] Visit Number: [id number] MRN: [id number]          Service Type: MED Location: [facility] 2905 [id number] Attending Physician:  Norman Clay Dictated by:   Nathen May, M.D., [facility] Proc. Date: 08/22/01 Admit Date:  08/21/2001 Discharge Date: 08/22/2001   CC:         Darden Palmer., M.D.  Barry Dienes Eloise Harman, M.D.   Consultation Report  REASON FOR CONSULTATION:  I had the pleasure of seeing Darik Massing today in consultation at the request of Dr. Donnie Aho because of palpitations.  HISTORY OF PRESENT ILLNESS:  He has a history of documented supraventricular tachycardia that is adenosine responsive and a history also of irregular palpitations with documented PVCs and PACs.  Over the last month, he has had increasing symptoms of irregular tachycardic palpitations occurring primarily at rest in two different symptoms; the first is persistent irregularity, and the other is persistence of irregular beats within a regular rhythm.  While monitored under the care of Dr. Donnie Aho, he had recurrent symptoms of the second variety which corresponded with atrial trigeminy.  He has not had symptoms of the former variety while in hospital.  His thromboembolic risk factors are notable for hypertension.  PAST MEDICAL HISTORY:  Notable for hypertension, rheumatic heart disease, GE reflux disease, depression, and back pain.  He also has mitral valve prolapse and had a negative Cardiolite in 2001.  He was recently given a 24-hour Holter monitor for these symptoms which was unrevealing.  PRESENT MEDICATIONS: 1. Aspirin 162. 2. Toprol 100 b.i.d. 3. Paxil. 4. Protonix.  PHYSICAL EXAMINATION:  VITAL SIGNS:  Blood pressure 118/64, pulse 68 and regular this examination.  HEENT:  Unrevealing.  CARDIAC:  Also normal.  LUNGS:  Clear.  DIAGNOSTIC DATA:  Electrocardiogram demonstrated sinus rhythm at 68  with intevals of 0.14/0.08/0.39 with occasional atrial ectopic beats.  Telemetry was notable as previously recorded with high frequency of ectopic beats coming in bursts during the day, up to 300 atrial ectopic beats per hour.  IMPRESSION: 1. Recurrent palpitations, primarily irregular hear beat without tachycardia,    with documented frequent premature atrial contractions and two distinct    syndromes of palpitations as described above. 2. Documented supraventricular tachycardia that is adenosine responsive. 3. Thromboembolic risks factors notable for hypertension.  I think the issues are two: One is, how do we control his symptoms?  Secondly, what is the basis of his symptoms?   One of his symptom complexes clarified while in hospital represents frequent atrial ectopy.  The other with a persistent irregularity may well represent higher densities of atrial ectopy, ventricular ectopy, and/or atrial fibrillation.  He is scheduled to get an event recorder from Dr. Dossie Arbour, and I would encourage him to follow through with this as clarification of atrial fibrillation is important given his thromboembolic risk factor of hypertension.  As it relates to trying to ameliorate his symptoms, I have taken the liberty of discontinuing his evening Toprol and putting him on Cardizem 180 mg in the evening and see if this combination works.  In the event that it does not, I would consider the use of a 1C antiarrhythmic drug, though it may be appropriate to repeat his Cardiolite scanning prior to this to make sure there has been no intercurrent development of occult coronary disease which would increase the risks of the ______ .  He will be following up with  Dr. Donnie Aho in about three weeks time, and I will see him again at Dr. Tawana Scale request. Dictated by:   Nathen May, M.D., [facility] Attending Physician:  Norman Clay DD:  08/22/01 TD:  08/23/01 Job:  [id number]

## 2010-10-29 NOTE — Consult Note (Signed)
[place]. [facility]  Patient:    [name], [name]                       MRN: [id number] Proc. Date: 08/15/00 Adm. Date:  [id number] Attending:  Mardella Layman CC:         Nathen May, M.D., [facility]  Barry Dienes. Eloise Harman, M.D.   Consultation Report  HISTORY:  The patient is a 60 year old male who I originally saw in July 1995, when he wanted a checkup of a heart murmur.  At the present time he was asymptomatic but had been using ___________ prophylaxis.  He was found to have mitral valve prolapse with mild to moderate mitral regurgitation which has remained stable over the years.  At the time he was smoking and he was advised to stop smoking.  He developed palpitations in September 1996 and was seen in the emergency room by Dr. Patty Sermons and was placed on atenolol.  He had side effects of the atenolol and noticed rare PVCs at that time.  He has been seen rarely over the years at which point he had PVCs and mitral valve prolapse.  He would develop some atypical chest pain and had a negative stress echo in May 1999.  He walked a total of 12 minutes and had no ST segment changes consistent with ischemia.  He was not seen again until February 2000 at which point in time he was having some episodic palpitations.  He was placed on Corgard at that time and developed joint aches and went back on Toprol 25 mg.  He then had increasing PVCs and chest discomfort and had some atypical features to it and had an MI ruled out.  He had a negative stress Cardiolite study at that time, walking 11 minutes with a normal ejection fraction and no ischemia.  He has complained sporadically of PVCs over the past year since then.  On September 6 he was seen in the emergency room with an episode of palpitations and had documented PSVT at a rate of about 170 that was seen on the ER strip on the EMS vehicle.  He was given 6 mg of adenosine and converted to sinus rhythm.  EKG  was unremarkable.  He was sent home on a higher dose of Toprol to 50 b.i.d.  He was seen in October at which point he noted he might be moving to [place] and had significant situational stress with a girlfriend who had been with him 11 years.  He then had significant situational stress related to this and was last seen by me November 5 at which point he was advised to call if he had recurrent problems since he was under so much stress.  He was seen by Dr. [place] in January of this year in my absence and was seen in the emergency room, with a normal EKG and some palpitations, and also noted persistent stress.  His morning dose was increased to 100 mg and he continued on 50 at bedtime.  He called again with increasing palpitations and was advised to take 100 mg twice a day.  With this dose his PVC frequency has diminished.  He was admitted through the emergency room last night with a feeling as if his heart would jolt and wake him up, and some vague epigastric symptoms and pain.  Dr. Eloise Harman admitted him to rule out an MI and I have not seen him at the present time.  He has had some vague jaw and neck soreness prior to going to bed and then had some left hand tingling and some vague epigastric symptoms and vague lower chest pain.  EKG was unremarkable.  PAST HISTORY:  Remarkable for varicose veins.  He has also recently been seen by someone for this.  History of rheumatic fever as a child.  History of urinary problems.  He has had a hydrocele repair.  He has had surgery on a ureter in the past.  He has had an inguinal hernia repair.  ALLERGIES:  None.  CURRENT MEDICATIONS:  1. Toprol XL 100 mg b.i.d.  2. Aspirin daily.  FAMILY HISTORY:  No premature cardiac disease.  Mother and father are living and brother and sister are healthy.  SOCIAL HISTORY:  He is divorced.  He has no children.  Had recently had a longtime friend who he had lived with for 11 years had left and moved  to [place].  There is significant turmoil and situational stress regarding this.  He quit smoking about 5-6 years ago.  He would occasionally drink alcohol.  REVIEW OF SYSTEMS:  He has had mitral annular calcification, moderate MVP in the past on echo.  He has used SBE over the years.  He is considering having some surgery done on his venous varicose veins.  PHYSICAL EXAMINATION:  GENERAL:  On exam, he is a pleasant, bearded male who appears stated age.  VITAL SIGNS:  Blood pressure 140/90.  SKIN:  Warm and dry.  HEENT:   Normal.  NECK:  Supple without masses, thyromegaly, or bruits.  LUNGS:  Clear.  CARDIAC:  Mid to late systolic murmur with a click consistent with ___________.  ABDOMEN:  Soft and nontender.  EXTREMITIES:  Femoral pulses 2+.  LABORATORY:  A 12-lead ECG is normal.  Labs are unremarkable.  IMPRESSION:  1. Vague symptoms of waking up at night with a jolt-feeling and panic     disorder.  It is uncertain whether this has an arrhythmic or cardiac cause     to it.  2. Epigastric and chest discomfort with atypical features.  3. Mild hypertension in the past.  4. Premature ventricular contractions documented previously.  5. Mitral valve prolapse.  6. Paroxysmal supraventricular tachycardia documented one time previously.  RECOMMENDATIONS:  Agree with ruling out MI. If rules out MI, favor observation of symptoms.  May consider an adenosine Cardiolite if symptoms persist.  I would also recommend Dr. Berton Mount to see the patient for arrhythmic opinion.  At the present time I think the patient needs to have a cardiac event monitor done to make certain that he does not have PSVT as a cause for his symptoms at night.  He could be a candidate for catheter ablation if he does.  I appreciate seeing him with you. DD:  08/15/00 TD:  08/16/00 Job: [id number]

## 2010-11-01 ENCOUNTER — Encounter (HOSPITAL_COMMUNITY): Payer: PRIVATE HEALTH INSURANCE

## 2010-11-03 ENCOUNTER — Encounter (HOSPITAL_COMMUNITY): Payer: PRIVATE HEALTH INSURANCE

## 2010-11-05 ENCOUNTER — Encounter (HOSPITAL_COMMUNITY): Payer: PRIVATE HEALTH INSURANCE

## 2010-11-08 ENCOUNTER — Encounter (HOSPITAL_COMMUNITY): Payer: PRIVATE HEALTH INSURANCE

## 2010-11-10 ENCOUNTER — Encounter (HOSPITAL_COMMUNITY): Payer: PRIVATE HEALTH INSURANCE

## 2010-11-12 ENCOUNTER — Encounter (HOSPITAL_COMMUNITY): Payer: PRIVATE HEALTH INSURANCE | Attending: Cardiology

## 2010-11-12 DIAGNOSIS — I5022 Chronic systolic (congestive) heart failure: Secondary | ICD-10-CM | POA: Insufficient documentation

## 2010-11-12 DIAGNOSIS — E785 Hyperlipidemia, unspecified: Secondary | ICD-10-CM | POA: Insufficient documentation

## 2010-11-12 DIAGNOSIS — Z7901 Long term (current) use of anticoagulants: Secondary | ICD-10-CM | POA: Insufficient documentation

## 2010-11-12 DIAGNOSIS — Z9889 Other specified postprocedural states: Secondary | ICD-10-CM | POA: Insufficient documentation

## 2010-11-12 DIAGNOSIS — K219 Gastro-esophageal reflux disease without esophagitis: Secondary | ICD-10-CM | POA: Insufficient documentation

## 2010-11-12 DIAGNOSIS — Z5189 Encounter for other specified aftercare: Secondary | ICD-10-CM | POA: Insufficient documentation

## 2010-11-12 DIAGNOSIS — I509 Heart failure, unspecified: Secondary | ICD-10-CM | POA: Insufficient documentation

## 2010-11-12 DIAGNOSIS — Z7982 Long term (current) use of aspirin: Secondary | ICD-10-CM | POA: Insufficient documentation

## 2010-11-12 DIAGNOSIS — I059 Rheumatic mitral valve disease, unspecified: Secondary | ICD-10-CM | POA: Insufficient documentation

## 2010-11-12 DIAGNOSIS — Z87891 Personal history of nicotine dependence: Secondary | ICD-10-CM | POA: Insufficient documentation

## 2010-11-15 ENCOUNTER — Encounter (HOSPITAL_COMMUNITY): Payer: PRIVATE HEALTH INSURANCE

## 2010-11-17 ENCOUNTER — Encounter (HOSPITAL_COMMUNITY): Payer: PRIVATE HEALTH INSURANCE

## 2010-11-19 ENCOUNTER — Encounter (HOSPITAL_COMMUNITY): Payer: PRIVATE HEALTH INSURANCE

## 2010-11-22 ENCOUNTER — Encounter (HOSPITAL_COMMUNITY): Payer: PRIVATE HEALTH INSURANCE

## 2010-11-24 ENCOUNTER — Encounter (HOSPITAL_COMMUNITY): Payer: PRIVATE HEALTH INSURANCE

## 2010-11-26 ENCOUNTER — Encounter (HOSPITAL_COMMUNITY): Payer: PRIVATE HEALTH INSURANCE

## 2010-11-29 ENCOUNTER — Encounter (HOSPITAL_COMMUNITY): Payer: PRIVATE HEALTH INSURANCE

## 2010-12-01 ENCOUNTER — Encounter (HOSPITAL_COMMUNITY): Payer: PRIVATE HEALTH INSURANCE

## 2010-12-03 ENCOUNTER — Encounter (HOSPITAL_COMMUNITY): Payer: PRIVATE HEALTH INSURANCE

## 2010-12-06 ENCOUNTER — Encounter (INDEPENDENT_AMBULATORY_CARE_PROVIDER_SITE_OTHER): Payer: PRIVATE HEALTH INSURANCE | Admitting: Thoracic Surgery (Cardiothoracic Vascular Surgery)

## 2010-12-06 ENCOUNTER — Encounter (HOSPITAL_COMMUNITY): Payer: PRIVATE HEALTH INSURANCE

## 2010-12-06 ENCOUNTER — Encounter: Payer: PRIVATE HEALTH INSURANCE | Admitting: Thoracic Surgery (Cardiothoracic Vascular Surgery)

## 2010-12-06 DIAGNOSIS — I059 Rheumatic mitral valve disease, unspecified: Secondary | ICD-10-CM

## 2010-12-07 NOTE — Assessment & Plan Note (Signed)
OFFICE VISIT  MIKA, ANASTASI DOB:  Mar 27, 1951                                        December 06, 2010 CHART #:  [id number]  HISTORY OF PRESENT ILLNESS:  The patient returned for routine followup status post right miniature thoracotomy for mitral valve repair on August 19, 2010.  He was last seen here in the office on October 04, 2010.  Since then he has continued to do well.  He has been actively involved in the cardiac rehab program and his exercise tolerance has gradually improved. He reports continued improvement in his ability to observe myself with decreasing amount of shortness of breath.  He notes that his breathing is already much better than it was immediately prior to surgery.  He has minimal residual pain and numbness in his right side related to his surgery.  He has not had any tachy palpitations or dizzy spells and he has had no further episodes to suggest a recurrence of atrial fibrillation.  He has been off amiodarone for now more than 2 weeks.  He continues to follow up with Dr. Donnie Aho and Dr. Eloise Harman for attention to his medical needs.  Overall, he seems to be doing well and he has no new complaints.  CURRENT MEDICATIONS:  Aspirin, Coumadin, Lasix, potassium, Toprol-XL, levothyroxine, and Lipitor.  PHYSICAL EXAMINATION:  GENERAL:  A well-appearing male.  VITAL SIGNS: Blood pressure 123/86, pulse 88 and regular, and oxygen saturation 98% on room air.  CHEST:  A well-healed mini thoracotomy scar.  Auscultation demonstrates clear breath sounds which are symmetrical bilaterally. CARDIOVASCULAR:  Regular rate and rhythm.  No murmurs, rubs, or gallops are noted.  ABDOMEN:  Soft and nontender.  EXTREMITIES:  Warm and well perfused.  There is no lower extremity edema.  IMPRESSION:  The patient seems to be doing well, now 3 months following mitral valve repair.  He is maintaining sinus rhythm off amiodarone.  PLAN:  I have encouraged the patient to  increase his physical activity without any particular limitations.  I think it would be reasonable to stop Coumadin in the near future if he continues to do well off amiodarone.  He tells me that he is scheduled for followup echocardiogram with Dr. Donnie Aho sometime in August.  We will plan to see him back in 3 months' time for further followup and to check the results of his followup echocardiogram.  Overall, he seems to be doing well.  Salvatore Decent. Cornelius Moras, M.D. Electronically Signed  CHO/MEDQ  D:  12/06/2010  T:  12/07/2010  Job:  [id number]  cc:   Georga Hacking, M.D. Barry Dienes Eloise Harman, M.D.

## 2010-12-08 ENCOUNTER — Encounter (HOSPITAL_COMMUNITY): Payer: PRIVATE HEALTH INSURANCE

## 2010-12-09 DIAGNOSIS — Z0271 Encounter for disability determination: Secondary | ICD-10-CM

## 2010-12-10 ENCOUNTER — Encounter (HOSPITAL_COMMUNITY): Payer: PRIVATE HEALTH INSURANCE

## 2010-12-13 ENCOUNTER — Encounter (HOSPITAL_COMMUNITY): Payer: PRIVATE HEALTH INSURANCE | Attending: Cardiology

## 2010-12-13 DIAGNOSIS — I5022 Chronic systolic (congestive) heart failure: Secondary | ICD-10-CM | POA: Insufficient documentation

## 2010-12-13 DIAGNOSIS — Z9889 Other specified postprocedural states: Secondary | ICD-10-CM | POA: Insufficient documentation

## 2010-12-13 DIAGNOSIS — I059 Rheumatic mitral valve disease, unspecified: Secondary | ICD-10-CM | POA: Insufficient documentation

## 2010-12-13 DIAGNOSIS — E785 Hyperlipidemia, unspecified: Secondary | ICD-10-CM | POA: Insufficient documentation

## 2010-12-13 DIAGNOSIS — K219 Gastro-esophageal reflux disease without esophagitis: Secondary | ICD-10-CM | POA: Insufficient documentation

## 2010-12-13 DIAGNOSIS — I509 Heart failure, unspecified: Secondary | ICD-10-CM | POA: Insufficient documentation

## 2010-12-13 DIAGNOSIS — Z7901 Long term (current) use of anticoagulants: Secondary | ICD-10-CM | POA: Insufficient documentation

## 2010-12-13 DIAGNOSIS — Z5189 Encounter for other specified aftercare: Secondary | ICD-10-CM | POA: Insufficient documentation

## 2010-12-13 DIAGNOSIS — Z87891 Personal history of nicotine dependence: Secondary | ICD-10-CM | POA: Insufficient documentation

## 2010-12-13 DIAGNOSIS — Z7982 Long term (current) use of aspirin: Secondary | ICD-10-CM | POA: Insufficient documentation

## 2010-12-15 ENCOUNTER — Encounter (HOSPITAL_COMMUNITY): Payer: PRIVATE HEALTH INSURANCE

## 2010-12-17 ENCOUNTER — Encounter (HOSPITAL_COMMUNITY): Payer: PRIVATE HEALTH INSURANCE

## 2010-12-20 ENCOUNTER — Encounter (HOSPITAL_COMMUNITY): Payer: PRIVATE HEALTH INSURANCE

## 2010-12-22 ENCOUNTER — Encounter (HOSPITAL_COMMUNITY): Payer: PRIVATE HEALTH INSURANCE

## 2010-12-24 ENCOUNTER — Encounter (HOSPITAL_COMMUNITY): Payer: PRIVATE HEALTH INSURANCE

## 2010-12-27 ENCOUNTER — Encounter (HOSPITAL_COMMUNITY): Payer: PRIVATE HEALTH INSURANCE

## 2010-12-29 ENCOUNTER — Encounter (HOSPITAL_COMMUNITY): Payer: PRIVATE HEALTH INSURANCE

## 2010-12-31 ENCOUNTER — Encounter (HOSPITAL_COMMUNITY): Payer: PRIVATE HEALTH INSURANCE

## 2011-01-03 ENCOUNTER — Encounter (HOSPITAL_COMMUNITY): Payer: PRIVATE HEALTH INSURANCE

## 2011-01-05 ENCOUNTER — Encounter (HOSPITAL_COMMUNITY): Payer: PRIVATE HEALTH INSURANCE

## 2011-01-07 ENCOUNTER — Emergency Department (HOSPITAL_COMMUNITY): Payer: PRIVATE HEALTH INSURANCE

## 2011-01-07 ENCOUNTER — Encounter (HOSPITAL_COMMUNITY): Payer: PRIVATE HEALTH INSURANCE

## 2011-01-07 ENCOUNTER — Emergency Department (HOSPITAL_COMMUNITY)
Admission: EM | Admit: 2011-01-07 | Discharge: 2011-01-08 | Disposition: A | Discharge status: Home / Self Care | Payer: PRIVATE HEALTH INSURANCE | Attending: Emergency Medicine | Admitting: Emergency Medicine

## 2011-01-07 DIAGNOSIS — H532 Diplopia: Secondary | ICD-10-CM | POA: Insufficient documentation

## 2011-01-07 DIAGNOSIS — R51 Headache: Secondary | ICD-10-CM | POA: Insufficient documentation

## 2011-01-07 DIAGNOSIS — Z9889 Other specified postprocedural states: Secondary | ICD-10-CM | POA: Insufficient documentation

## 2011-01-07 DIAGNOSIS — I4891 Unspecified atrial fibrillation: Secondary | ICD-10-CM | POA: Insufficient documentation

## 2011-01-07 DIAGNOSIS — Z7901 Long term (current) use of anticoagulants: Secondary | ICD-10-CM | POA: Insufficient documentation

## 2011-01-07 DIAGNOSIS — I10 Essential (primary) hypertension: Secondary | ICD-10-CM | POA: Insufficient documentation

## 2011-01-07 DIAGNOSIS — Z7982 Long term (current) use of aspirin: Secondary | ICD-10-CM | POA: Insufficient documentation

## 2011-01-07 DIAGNOSIS — H539 Unspecified visual disturbance: Secondary | ICD-10-CM | POA: Insufficient documentation

## 2011-01-07 DIAGNOSIS — I509 Heart failure, unspecified: Secondary | ICD-10-CM | POA: Insufficient documentation

## 2011-01-07 DIAGNOSIS — E785 Hyperlipidemia, unspecified: Secondary | ICD-10-CM | POA: Insufficient documentation

## 2011-01-07 DIAGNOSIS — G459 Transient cerebral ischemic attack, unspecified: Secondary | ICD-10-CM | POA: Insufficient documentation

## 2011-01-07 DIAGNOSIS — Z79899 Other long term (current) drug therapy: Secondary | ICD-10-CM | POA: Insufficient documentation

## 2011-01-07 LAB — DIFFERENTIAL
Eosinophils Absolute: 0.1 10*3/uL (ref 0.0–0.7)
Lymphocytes Relative: 21 % (ref 12–46)
Lymphs Abs: 1.7 10*3/uL (ref 0.7–4.0)
Monocytes Absolute: 0.6 10*3/uL (ref 0.1–1.0)
Monocytes Relative: 7 % (ref 3–12)
Neutro Abs: 5.7 10*3/uL (ref 1.7–7.7)

## 2011-01-07 LAB — HEPATIC FUNCTION PANEL
ALT: 45 U/L (ref 0–53)
Total Protein: 7.4 g/dL (ref 6.0–8.3)

## 2011-01-07 LAB — CBC
MCH: 28.3 pg (ref 26.0–34.0)
MCHC: 33.4 g/dL (ref 30.0–36.0)
MCV: 84.6 fL (ref 78.0–100.0)
Platelets: 278 10*3/uL (ref 150–400)
RBC: 4.95 MIL/uL (ref 4.22–5.81)

## 2011-01-07 LAB — POCT I-STAT, CHEM 8
HCT: 44 % (ref 39.0–52.0)
Hemoglobin: 15 g/dL (ref 13.0–17.0)
Potassium: 4.6 mEq/L (ref 3.5–5.1)
Sodium: 139 mEq/L (ref 135–145)
TCO2: 25 mmol/L (ref 0–100)

## 2011-01-07 LAB — CK TOTAL AND CKMB (NOT AT ARMC)
CK, MB: 4.5 ng/mL — ABNORMAL HIGH (ref 0.3–4.0)
Total CK: 144 U/L (ref 7–232)

## 2011-01-07 LAB — TROPONIN I: Troponin I: <0.3 ng/mL (ref <0.30)

## 2011-01-08 LAB — PROTIME-INR
INR: 1.14 (ref 0.00–1.49)
Prothrombin Time: 14.8 seconds (ref 11.6–15.2)

## 2011-01-08 LAB — HEMOGLOBIN A1C: Hgb A1c MFr Bld: 5.6 % (ref <5.7)

## 2011-01-10 ENCOUNTER — Encounter (HOSPITAL_COMMUNITY): Payer: PRIVATE HEALTH INSURANCE

## 2011-01-12 ENCOUNTER — Encounter (HOSPITAL_COMMUNITY): Payer: PRIVATE HEALTH INSURANCE | Attending: Cardiology

## 2011-01-12 DIAGNOSIS — I059 Rheumatic mitral valve disease, unspecified: Secondary | ICD-10-CM | POA: Insufficient documentation

## 2011-01-12 DIAGNOSIS — I5022 Chronic systolic (congestive) heart failure: Secondary | ICD-10-CM | POA: Insufficient documentation

## 2011-01-12 DIAGNOSIS — Z87891 Personal history of nicotine dependence: Secondary | ICD-10-CM | POA: Insufficient documentation

## 2011-01-12 DIAGNOSIS — Z9889 Other specified postprocedural states: Secondary | ICD-10-CM | POA: Insufficient documentation

## 2011-01-12 DIAGNOSIS — K219 Gastro-esophageal reflux disease without esophagitis: Secondary | ICD-10-CM | POA: Insufficient documentation

## 2011-01-12 DIAGNOSIS — E785 Hyperlipidemia, unspecified: Secondary | ICD-10-CM | POA: Insufficient documentation

## 2011-01-12 DIAGNOSIS — Z7901 Long term (current) use of anticoagulants: Secondary | ICD-10-CM | POA: Insufficient documentation

## 2011-01-12 DIAGNOSIS — I509 Heart failure, unspecified: Secondary | ICD-10-CM | POA: Insufficient documentation

## 2011-01-12 DIAGNOSIS — Z7982 Long term (current) use of aspirin: Secondary | ICD-10-CM | POA: Insufficient documentation

## 2011-01-12 DIAGNOSIS — Z5189 Encounter for other specified aftercare: Secondary | ICD-10-CM | POA: Insufficient documentation

## 2011-01-14 ENCOUNTER — Encounter (HOSPITAL_COMMUNITY): Payer: PRIVATE HEALTH INSURANCE

## 2011-01-17 ENCOUNTER — Encounter (HOSPITAL_COMMUNITY): Payer: PRIVATE HEALTH INSURANCE

## 2011-01-19 ENCOUNTER — Encounter (HOSPITAL_COMMUNITY): Payer: PRIVATE HEALTH INSURANCE

## 2011-01-21 ENCOUNTER — Encounter (HOSPITAL_COMMUNITY): Payer: PRIVATE HEALTH INSURANCE

## 2011-01-24 ENCOUNTER — Encounter (HOSPITAL_COMMUNITY)
Admission: RE | Admit: 2011-01-24 | Discharge: 2011-01-24 | Disposition: A | Discharge status: Home / Self Care | Payer: Self-pay | Source: Ambulatory Visit | Attending: Cardiology | Admitting: Cardiology

## 2011-01-24 DIAGNOSIS — Z7982 Long term (current) use of aspirin: Secondary | ICD-10-CM | POA: Insufficient documentation

## 2011-01-24 DIAGNOSIS — I5022 Chronic systolic (congestive) heart failure: Secondary | ICD-10-CM | POA: Insufficient documentation

## 2011-01-24 DIAGNOSIS — Z5189 Encounter for other specified aftercare: Secondary | ICD-10-CM | POA: Insufficient documentation

## 2011-01-24 DIAGNOSIS — E785 Hyperlipidemia, unspecified: Secondary | ICD-10-CM | POA: Insufficient documentation

## 2011-01-24 DIAGNOSIS — I059 Rheumatic mitral valve disease, unspecified: Secondary | ICD-10-CM | POA: Insufficient documentation

## 2011-01-24 DIAGNOSIS — Z7901 Long term (current) use of anticoagulants: Secondary | ICD-10-CM | POA: Insufficient documentation

## 2011-01-24 DIAGNOSIS — Z87891 Personal history of nicotine dependence: Secondary | ICD-10-CM | POA: Insufficient documentation

## 2011-01-24 DIAGNOSIS — Z9889 Other specified postprocedural states: Secondary | ICD-10-CM | POA: Insufficient documentation

## 2011-01-24 DIAGNOSIS — K219 Gastro-esophageal reflux disease without esophagitis: Secondary | ICD-10-CM | POA: Insufficient documentation

## 2011-01-24 DIAGNOSIS — I509 Heart failure, unspecified: Secondary | ICD-10-CM | POA: Insufficient documentation

## 2011-01-26 ENCOUNTER — Encounter (HOSPITAL_COMMUNITY): Payer: Self-pay

## 2011-01-28 ENCOUNTER — Encounter (HOSPITAL_COMMUNITY): Payer: Self-pay

## 2011-01-31 ENCOUNTER — Encounter (HOSPITAL_COMMUNITY): Payer: Self-pay

## 2011-02-02 ENCOUNTER — Encounter (HOSPITAL_COMMUNITY): Payer: Self-pay

## 2011-02-04 ENCOUNTER — Encounter (HOSPITAL_COMMUNITY): Payer: Self-pay

## 2011-02-07 ENCOUNTER — Encounter (HOSPITAL_COMMUNITY): Payer: Self-pay

## 2011-02-09 ENCOUNTER — Encounter (HOSPITAL_COMMUNITY): Payer: Self-pay

## 2011-02-11 ENCOUNTER — Encounter (HOSPITAL_COMMUNITY): Payer: Self-pay

## 2011-02-14 ENCOUNTER — Encounter (HOSPITAL_COMMUNITY): Payer: Self-pay | Attending: Cardiology

## 2011-02-14 DIAGNOSIS — Z87891 Personal history of nicotine dependence: Secondary | ICD-10-CM | POA: Insufficient documentation

## 2011-02-14 DIAGNOSIS — Z5189 Encounter for other specified aftercare: Secondary | ICD-10-CM | POA: Insufficient documentation

## 2011-02-14 DIAGNOSIS — I059 Rheumatic mitral valve disease, unspecified: Secondary | ICD-10-CM | POA: Insufficient documentation

## 2011-02-14 DIAGNOSIS — E785 Hyperlipidemia, unspecified: Secondary | ICD-10-CM | POA: Insufficient documentation

## 2011-02-14 DIAGNOSIS — Z7982 Long term (current) use of aspirin: Secondary | ICD-10-CM | POA: Insufficient documentation

## 2011-02-14 DIAGNOSIS — Z7901 Long term (current) use of anticoagulants: Secondary | ICD-10-CM | POA: Insufficient documentation

## 2011-02-14 DIAGNOSIS — K219 Gastro-esophageal reflux disease without esophagitis: Secondary | ICD-10-CM | POA: Insufficient documentation

## 2011-02-14 DIAGNOSIS — I5022 Chronic systolic (congestive) heart failure: Secondary | ICD-10-CM | POA: Insufficient documentation

## 2011-02-14 DIAGNOSIS — Z9889 Other specified postprocedural states: Secondary | ICD-10-CM | POA: Insufficient documentation

## 2011-02-14 DIAGNOSIS — I509 Heart failure, unspecified: Secondary | ICD-10-CM | POA: Insufficient documentation

## 2011-02-16 ENCOUNTER — Encounter (HOSPITAL_COMMUNITY): Payer: Self-pay

## 2011-02-17 DIAGNOSIS — C4491 Basal cell carcinoma of skin, unspecified: Secondary | ICD-10-CM | POA: Insufficient documentation

## 2011-02-17 DIAGNOSIS — Z0000 Encounter for general adult medical examination without abnormal findings: Secondary | ICD-10-CM | POA: Insufficient documentation

## 2011-02-18 ENCOUNTER — Encounter (HOSPITAL_COMMUNITY): Payer: Self-pay

## 2011-02-18 DIAGNOSIS — L02214 Cutaneous abscess of groin: Secondary | ICD-10-CM | POA: Insufficient documentation

## 2011-02-21 ENCOUNTER — Encounter (HOSPITAL_COMMUNITY): Payer: Self-pay

## 2011-02-23 ENCOUNTER — Encounter (HOSPITAL_COMMUNITY): Payer: Self-pay

## 2011-02-25 ENCOUNTER — Encounter (HOSPITAL_COMMUNITY): Payer: Self-pay

## 2011-02-28 ENCOUNTER — Encounter: Payer: Self-pay | Admitting: Thoracic Surgery (Cardiothoracic Vascular Surgery)

## 2011-02-28 ENCOUNTER — Encounter (HOSPITAL_COMMUNITY): Payer: Self-pay

## 2011-02-28 DIAGNOSIS — N200 Calculus of kidney: Secondary | ICD-10-CM | POA: Insufficient documentation

## 2011-02-28 DIAGNOSIS — E785 Hyperlipidemia, unspecified: Secondary | ICD-10-CM | POA: Insufficient documentation

## 2011-02-28 DIAGNOSIS — R002 Palpitations: Secondary | ICD-10-CM | POA: Insufficient documentation

## 2011-02-28 DIAGNOSIS — I00 Rheumatic fever without heart involvement: Secondary | ICD-10-CM

## 2011-02-28 DIAGNOSIS — H905 Unspecified sensorineural hearing loss: Secondary | ICD-10-CM | POA: Insufficient documentation

## 2011-02-28 DIAGNOSIS — R29898 Other symptoms and signs involving the musculoskeletal system: Secondary | ICD-10-CM | POA: Insufficient documentation

## 2011-02-28 DIAGNOSIS — I341 Nonrheumatic mitral (valve) prolapse: Secondary | ICD-10-CM | POA: Insufficient documentation

## 2011-02-28 DIAGNOSIS — I509 Heart failure, unspecified: Secondary | ICD-10-CM | POA: Insufficient documentation

## 2011-03-02 ENCOUNTER — Encounter (HOSPITAL_COMMUNITY): Payer: Self-pay

## 2011-03-04 ENCOUNTER — Encounter (HOSPITAL_COMMUNITY): Payer: Self-pay

## 2011-03-07 ENCOUNTER — Ambulatory Visit (INDEPENDENT_AMBULATORY_CARE_PROVIDER_SITE_OTHER): Payer: PRIVATE HEALTH INSURANCE | Admitting: Thoracic Surgery (Cardiothoracic Vascular Surgery)

## 2011-03-07 ENCOUNTER — Encounter: Payer: Self-pay | Admitting: Thoracic Surgery (Cardiothoracic Vascular Surgery)

## 2011-03-07 ENCOUNTER — Encounter (HOSPITAL_COMMUNITY): Payer: Self-pay

## 2011-03-07 VITALS — BP 125/85 | HR 79 | Resp 16 | Ht 68.5 in | Wt 178.0 lb

## 2011-03-07 DIAGNOSIS — I059 Rheumatic mitral valve disease, unspecified: Secondary | ICD-10-CM

## 2011-03-07 NOTE — Progress Notes (Signed)
  HPI: Patient returns for routine postoperative follow-up having undergone right miniature thoracotomy for mitral valve repair on 08/19/2010. He was last seen here in the office on 12/06/2010. On July 27 he had a very transient episode of double vision for which she was seen in the [facility] emergency department. MRI and MRA of the brain was completely normal. 2-D echocardiogram also look good with normal left ventricular function and intact mitral valve repair. The patient was subsequently seen by an ophthalmologist who apparently felt this likely did not represent an embolic event. Since then the patient has also been seen in followup by Dr. Donnie Aho was placed a 30 day rhythm and event monitor. The results of this exam remain pending at this time. The patient has otherwise done quite well. He reports that his exercise tolerance continues to improve. He has no complaints.  Current Outpatient Prescriptions  Medication Sig Dispense Refill  . aspirin 81 MG tablet Take 81 mg by mouth daily.        Marland Kitchen atorvastatin (LIPITOR) 20 MG tablet Take 20 mg by mouth daily.        Marland Kitchen levothyroxine (SYNTHROID, LEVOTHROID) 50 MCG tablet Take 75 mcg by mouth daily.       . metoprolol (TOPROL-XL) 100 MG 24 hr tablet Take 100 mg by mouth daily.        . Pot Cl in D5W Lact Ringers (KCL 0.3%-LACTATED RINGERS-D5W) 40 MEQ/L SOLN Inject into the vein continuous.        . potassium chloride (K-DUR) 10 MEQ tablet Take 10 mEq by mouth 2 (two) times daily.        . potassium chloride (KLOR-CON) 10 MEQ CR tablet Take 20 mEq by mouth 4 (four) times daily.        Marland Kitchen warfarin (COUMADIN) 2.5 MG tablet Take 2.5 mg by mouth 1 day or 1 dose. 2.5MG  ON M/WED/FRI AND 5MG  ON SUN,TUES,THURS BY DR Viann Fish       . furosemide (LASIX) 40 MG tablet Take 40 mg by mouth 1 day or 1 dose.          Physical Exam: On physical examination the patient looks quite good. There are no murmurs on exam and his heart sounds are brisk. All the surgical  incisions are healed nicely. Breath sounds are clear. There is no lower extremity edema.  Diagnostic Tests: Transthoracic echocardiogram performed 12/09/2010 is reviewed. This reveals intact mitral valve repair with no mitral regurgitation. Left ventricular function is normal.  Impression: The patient is doing well now 6 months following mitral valve repair.  Plan: The patient will call and return to see [place] in the future as needed. All of his questions have been addressed.

## 2011-03-09 ENCOUNTER — Encounter (HOSPITAL_COMMUNITY): Payer: Self-pay

## 2011-03-11 ENCOUNTER — Encounter (HOSPITAL_COMMUNITY): Payer: Self-pay

## 2011-03-14 ENCOUNTER — Encounter (HOSPITAL_COMMUNITY): Payer: Self-pay | Attending: Cardiology

## 2011-03-14 DIAGNOSIS — Z7901 Long term (current) use of anticoagulants: Secondary | ICD-10-CM | POA: Insufficient documentation

## 2011-03-14 DIAGNOSIS — Z87891 Personal history of nicotine dependence: Secondary | ICD-10-CM | POA: Insufficient documentation

## 2011-03-14 DIAGNOSIS — I059 Rheumatic mitral valve disease, unspecified: Secondary | ICD-10-CM | POA: Insufficient documentation

## 2011-03-14 DIAGNOSIS — Z5189 Encounter for other specified aftercare: Secondary | ICD-10-CM | POA: Insufficient documentation

## 2011-03-14 DIAGNOSIS — I5022 Chronic systolic (congestive) heart failure: Secondary | ICD-10-CM | POA: Insufficient documentation

## 2011-03-14 DIAGNOSIS — Z7982 Long term (current) use of aspirin: Secondary | ICD-10-CM | POA: Insufficient documentation

## 2011-03-14 DIAGNOSIS — Z9889 Other specified postprocedural states: Secondary | ICD-10-CM | POA: Insufficient documentation

## 2011-03-14 DIAGNOSIS — E785 Hyperlipidemia, unspecified: Secondary | ICD-10-CM | POA: Insufficient documentation

## 2011-03-14 DIAGNOSIS — K219 Gastro-esophageal reflux disease without esophagitis: Secondary | ICD-10-CM | POA: Insufficient documentation

## 2011-03-14 DIAGNOSIS — I509 Heart failure, unspecified: Secondary | ICD-10-CM | POA: Insufficient documentation

## 2011-03-16 ENCOUNTER — Encounter (HOSPITAL_COMMUNITY): Payer: Self-pay

## 2011-03-18 ENCOUNTER — Encounter (HOSPITAL_COMMUNITY): Payer: Self-pay

## 2011-03-21 ENCOUNTER — Encounter (HOSPITAL_COMMUNITY): Payer: Self-pay

## 2011-03-23 ENCOUNTER — Encounter (HOSPITAL_COMMUNITY): Payer: Self-pay

## 2011-03-25 ENCOUNTER — Encounter (HOSPITAL_COMMUNITY): Payer: Self-pay

## 2011-03-28 ENCOUNTER — Encounter (HOSPITAL_COMMUNITY): Payer: Self-pay

## 2011-03-30 ENCOUNTER — Encounter (HOSPITAL_COMMUNITY): Payer: Self-pay

## 2011-04-01 ENCOUNTER — Encounter (HOSPITAL_COMMUNITY): Payer: Self-pay

## 2011-04-04 ENCOUNTER — Encounter (HOSPITAL_COMMUNITY): Payer: Self-pay

## 2011-04-04 DIAGNOSIS — R7401 Elevation of levels of liver transaminase levels: Secondary | ICD-10-CM | POA: Insufficient documentation

## 2011-04-06 ENCOUNTER — Encounter (HOSPITAL_COMMUNITY): Payer: Self-pay

## 2011-04-08 ENCOUNTER — Encounter (HOSPITAL_COMMUNITY): Payer: Self-pay

## 2011-04-11 ENCOUNTER — Encounter (HOSPITAL_COMMUNITY): Payer: Self-pay

## 2011-04-13 ENCOUNTER — Encounter (HOSPITAL_COMMUNITY): Payer: Self-pay

## 2011-04-15 ENCOUNTER — Encounter (HOSPITAL_COMMUNITY): Payer: PRIVATE HEALTH INSURANCE

## 2011-04-18 ENCOUNTER — Encounter (HOSPITAL_COMMUNITY): Payer: PRIVATE HEALTH INSURANCE

## 2011-04-20 ENCOUNTER — Encounter (HOSPITAL_COMMUNITY): Payer: PRIVATE HEALTH INSURANCE

## 2011-04-22 ENCOUNTER — Encounter (HOSPITAL_COMMUNITY): Payer: PRIVATE HEALTH INSURANCE

## 2011-04-25 ENCOUNTER — Encounter (HOSPITAL_COMMUNITY): Payer: PRIVATE HEALTH INSURANCE

## 2011-04-27 ENCOUNTER — Encounter (HOSPITAL_COMMUNITY): Payer: PRIVATE HEALTH INSURANCE

## 2011-04-29 ENCOUNTER — Encounter (HOSPITAL_COMMUNITY): Payer: PRIVATE HEALTH INSURANCE

## 2011-05-02 ENCOUNTER — Encounter (HOSPITAL_COMMUNITY): Payer: PRIVATE HEALTH INSURANCE

## 2011-05-04 ENCOUNTER — Encounter (HOSPITAL_COMMUNITY): Payer: PRIVATE HEALTH INSURANCE

## 2011-05-06 ENCOUNTER — Encounter (HOSPITAL_COMMUNITY): Payer: PRIVATE HEALTH INSURANCE

## 2011-05-09 ENCOUNTER — Encounter (HOSPITAL_COMMUNITY): Payer: PRIVATE HEALTH INSURANCE

## 2011-05-11 ENCOUNTER — Encounter (HOSPITAL_COMMUNITY): Payer: PRIVATE HEALTH INSURANCE

## 2011-05-13 ENCOUNTER — Encounter (HOSPITAL_COMMUNITY): Payer: PRIVATE HEALTH INSURANCE

## 2011-05-16 ENCOUNTER — Encounter (HOSPITAL_COMMUNITY): Payer: PRIVATE HEALTH INSURANCE

## 2011-05-18 ENCOUNTER — Encounter (HOSPITAL_COMMUNITY): Payer: PRIVATE HEALTH INSURANCE

## 2011-05-20 ENCOUNTER — Encounter (HOSPITAL_COMMUNITY): Payer: PRIVATE HEALTH INSURANCE

## 2011-05-23 ENCOUNTER — Encounter (HOSPITAL_COMMUNITY): Payer: PRIVATE HEALTH INSURANCE

## 2011-05-25 ENCOUNTER — Encounter (HOSPITAL_COMMUNITY): Payer: PRIVATE HEALTH INSURANCE

## 2011-05-27 ENCOUNTER — Encounter (HOSPITAL_COMMUNITY): Payer: PRIVATE HEALTH INSURANCE

## 2011-05-30 ENCOUNTER — Encounter (HOSPITAL_COMMUNITY): Payer: PRIVATE HEALTH INSURANCE

## 2011-06-01 ENCOUNTER — Encounter (HOSPITAL_COMMUNITY): Payer: PRIVATE HEALTH INSURANCE

## 2011-06-03 ENCOUNTER — Encounter (HOSPITAL_COMMUNITY): Payer: PRIVATE HEALTH INSURANCE

## 2011-06-06 ENCOUNTER — Encounter (HOSPITAL_COMMUNITY): Payer: PRIVATE HEALTH INSURANCE

## 2011-06-08 ENCOUNTER — Encounter (HOSPITAL_COMMUNITY): Payer: PRIVATE HEALTH INSURANCE

## 2011-06-08 DIAGNOSIS — N399 Disorder of urinary system, unspecified: Secondary | ICD-10-CM | POA: Insufficient documentation

## 2011-06-08 DIAGNOSIS — R10A0 Flank pain, unspecified side: Secondary | ICD-10-CM | POA: Insufficient documentation

## 2011-06-08 DIAGNOSIS — R109 Unspecified abdominal pain: Secondary | ICD-10-CM | POA: Insufficient documentation

## 2011-06-08 DIAGNOSIS — R350 Frequency of micturition: Secondary | ICD-10-CM | POA: Insufficient documentation

## 2011-06-10 ENCOUNTER — Encounter (HOSPITAL_COMMUNITY): Payer: PRIVATE HEALTH INSURANCE

## 2011-06-13 ENCOUNTER — Encounter (HOSPITAL_COMMUNITY): Payer: PRIVATE HEALTH INSURANCE

## 2011-06-15 ENCOUNTER — Encounter (HOSPITAL_COMMUNITY): Payer: PRIVATE HEALTH INSURANCE

## 2011-06-17 ENCOUNTER — Encounter (HOSPITAL_COMMUNITY): Payer: PRIVATE HEALTH INSURANCE

## 2011-06-20 ENCOUNTER — Encounter (HOSPITAL_COMMUNITY): Payer: PRIVATE HEALTH INSURANCE

## 2011-06-22 ENCOUNTER — Encounter (HOSPITAL_COMMUNITY): Payer: PRIVATE HEALTH INSURANCE

## 2011-06-23 ENCOUNTER — Other Ambulatory Visit: Payer: Self-pay | Admitting: Urology

## 2011-06-23 DIAGNOSIS — N1330 Unspecified hydronephrosis: Secondary | ICD-10-CM

## 2011-06-24 ENCOUNTER — Encounter (HOSPITAL_COMMUNITY): Payer: PRIVATE HEALTH INSURANCE

## 2011-06-27 ENCOUNTER — Encounter (HOSPITAL_COMMUNITY): Payer: PRIVATE HEALTH INSURANCE

## 2011-06-29 ENCOUNTER — Encounter (HOSPITAL_COMMUNITY): Payer: PRIVATE HEALTH INSURANCE

## 2011-07-01 ENCOUNTER — Encounter (HOSPITAL_COMMUNITY): Payer: PRIVATE HEALTH INSURANCE

## 2011-07-04 ENCOUNTER — Encounter (HOSPITAL_COMMUNITY): Payer: PRIVATE HEALTH INSURANCE

## 2011-07-04 ENCOUNTER — Encounter (HOSPITAL_COMMUNITY)
Admission: RE | Admit: 2011-07-04 | Discharge: 2011-07-04 | Disposition: A | Discharge status: Home / Self Care | Payer: PRIVATE HEALTH INSURANCE | Source: Ambulatory Visit | Attending: Urology | Admitting: Urology

## 2011-07-04 DIAGNOSIS — N1330 Unspecified hydronephrosis: Secondary | ICD-10-CM

## 2011-07-04 DIAGNOSIS — M549 Dorsalgia, unspecified: Secondary | ICD-10-CM | POA: Insufficient documentation

## 2011-07-04 DIAGNOSIS — Z87442 Personal history of urinary calculi: Secondary | ICD-10-CM | POA: Insufficient documentation

## 2011-07-04 MED ORDER — FUROSEMIDE 10 MG/ML IJ SOLN
40.0000 mg | INTRAMUSCULAR | Status: DC
  Filled 2011-07-04: qty 4

## 2011-07-04 MED ORDER — TECHNETIUM TC 99M MERTIATIDE
15.2000 | Freq: Once | INTRAVENOUS | Status: AC | PRN
  Administered 2011-07-04: 15 via INTRAVENOUS

## 2011-07-06 ENCOUNTER — Encounter (HOSPITAL_COMMUNITY): Payer: PRIVATE HEALTH INSURANCE

## 2011-07-08 ENCOUNTER — Encounter (HOSPITAL_COMMUNITY): Payer: PRIVATE HEALTH INSURANCE

## 2011-07-11 ENCOUNTER — Encounter (HOSPITAL_COMMUNITY): Payer: PRIVATE HEALTH INSURANCE

## 2011-07-13 ENCOUNTER — Encounter (HOSPITAL_COMMUNITY): Payer: PRIVATE HEALTH INSURANCE

## 2011-07-15 ENCOUNTER — Encounter (HOSPITAL_COMMUNITY): Payer: PRIVATE HEALTH INSURANCE

## 2011-07-18 ENCOUNTER — Encounter (HOSPITAL_COMMUNITY): Payer: PRIVATE HEALTH INSURANCE

## 2011-07-20 ENCOUNTER — Encounter (HOSPITAL_COMMUNITY): Payer: PRIVATE HEALTH INSURANCE

## 2011-07-22 ENCOUNTER — Encounter (HOSPITAL_COMMUNITY): Payer: PRIVATE HEALTH INSURANCE

## 2011-07-25 ENCOUNTER — Encounter (HOSPITAL_COMMUNITY): Payer: PRIVATE HEALTH INSURANCE

## 2011-07-27 ENCOUNTER — Encounter (HOSPITAL_COMMUNITY): Payer: PRIVATE HEALTH INSURANCE

## 2011-07-29 ENCOUNTER — Encounter (HOSPITAL_COMMUNITY): Payer: PRIVATE HEALTH INSURANCE

## 2011-08-01 ENCOUNTER — Encounter (HOSPITAL_COMMUNITY): Payer: PRIVATE HEALTH INSURANCE

## 2011-08-03 ENCOUNTER — Encounter (HOSPITAL_COMMUNITY): Payer: PRIVATE HEALTH INSURANCE

## 2011-08-05 ENCOUNTER — Encounter (HOSPITAL_COMMUNITY): Payer: PRIVATE HEALTH INSURANCE

## 2011-08-08 ENCOUNTER — Encounter (HOSPITAL_COMMUNITY): Payer: PRIVATE HEALTH INSURANCE

## 2011-08-10 ENCOUNTER — Encounter (HOSPITAL_COMMUNITY): Payer: PRIVATE HEALTH INSURANCE

## 2011-08-12 ENCOUNTER — Encounter (HOSPITAL_COMMUNITY): Payer: PRIVATE HEALTH INSURANCE

## 2011-08-15 ENCOUNTER — Encounter (HOSPITAL_COMMUNITY): Payer: PRIVATE HEALTH INSURANCE

## 2011-08-17 ENCOUNTER — Encounter (HOSPITAL_COMMUNITY): Payer: PRIVATE HEALTH INSURANCE

## 2011-08-19 ENCOUNTER — Encounter (HOSPITAL_COMMUNITY): Payer: PRIVATE HEALTH INSURANCE

## 2011-08-22 ENCOUNTER — Encounter (HOSPITAL_COMMUNITY): Payer: PRIVATE HEALTH INSURANCE

## 2011-08-24 ENCOUNTER — Encounter (HOSPITAL_COMMUNITY): Payer: PRIVATE HEALTH INSURANCE

## 2011-08-26 ENCOUNTER — Encounter (HOSPITAL_COMMUNITY): Payer: PRIVATE HEALTH INSURANCE

## 2011-08-29 ENCOUNTER — Encounter (HOSPITAL_COMMUNITY): Payer: PRIVATE HEALTH INSURANCE

## 2011-08-31 ENCOUNTER — Encounter (HOSPITAL_COMMUNITY): Payer: PRIVATE HEALTH INSURANCE

## 2011-09-02 ENCOUNTER — Encounter (HOSPITAL_COMMUNITY): Payer: PRIVATE HEALTH INSURANCE

## 2011-09-05 ENCOUNTER — Encounter (HOSPITAL_COMMUNITY): Payer: PRIVATE HEALTH INSURANCE

## 2011-09-07 ENCOUNTER — Encounter (HOSPITAL_COMMUNITY): Payer: PRIVATE HEALTH INSURANCE

## 2011-09-09 ENCOUNTER — Encounter (HOSPITAL_COMMUNITY): Payer: PRIVATE HEALTH INSURANCE

## 2011-09-12 ENCOUNTER — Encounter (HOSPITAL_COMMUNITY): Payer: PRIVATE HEALTH INSURANCE

## 2011-09-14 ENCOUNTER — Encounter (HOSPITAL_COMMUNITY): Payer: PRIVATE HEALTH INSURANCE

## 2011-09-16 ENCOUNTER — Encounter (HOSPITAL_COMMUNITY): Payer: PRIVATE HEALTH INSURANCE

## 2011-09-19 ENCOUNTER — Encounter (HOSPITAL_COMMUNITY): Payer: PRIVATE HEALTH INSURANCE

## 2011-09-21 ENCOUNTER — Encounter (HOSPITAL_COMMUNITY): Payer: PRIVATE HEALTH INSURANCE

## 2011-09-23 ENCOUNTER — Encounter (HOSPITAL_COMMUNITY): Payer: PRIVATE HEALTH INSURANCE

## 2011-09-26 ENCOUNTER — Encounter (HOSPITAL_COMMUNITY): Payer: PRIVATE HEALTH INSURANCE

## 2011-09-28 ENCOUNTER — Encounter (HOSPITAL_COMMUNITY): Payer: PRIVATE HEALTH INSURANCE

## 2011-09-30 ENCOUNTER — Encounter (HOSPITAL_COMMUNITY): Payer: PRIVATE HEALTH INSURANCE

## 2011-10-03 ENCOUNTER — Encounter (HOSPITAL_COMMUNITY): Payer: PRIVATE HEALTH INSURANCE

## 2011-10-05 ENCOUNTER — Encounter (HOSPITAL_COMMUNITY): Payer: PRIVATE HEALTH INSURANCE

## 2011-10-07 ENCOUNTER — Encounter (HOSPITAL_COMMUNITY): Payer: PRIVATE HEALTH INSURANCE

## 2011-10-07 DIAGNOSIS — N529 Male erectile dysfunction, unspecified: Secondary | ICD-10-CM | POA: Insufficient documentation

## 2011-10-10 ENCOUNTER — Encounter (HOSPITAL_COMMUNITY): Payer: PRIVATE HEALTH INSURANCE

## 2011-10-12 ENCOUNTER — Encounter (HOSPITAL_COMMUNITY): Payer: PRIVATE HEALTH INSURANCE

## 2011-10-14 ENCOUNTER — Encounter (HOSPITAL_COMMUNITY): Payer: PRIVATE HEALTH INSURANCE

## 2011-10-17 ENCOUNTER — Encounter (HOSPITAL_COMMUNITY): Payer: PRIVATE HEALTH INSURANCE

## 2011-10-19 ENCOUNTER — Encounter (HOSPITAL_COMMUNITY): Payer: PRIVATE HEALTH INSURANCE

## 2011-10-21 ENCOUNTER — Encounter (HOSPITAL_COMMUNITY): Payer: PRIVATE HEALTH INSURANCE

## 2011-10-24 ENCOUNTER — Encounter (HOSPITAL_COMMUNITY): Payer: PRIVATE HEALTH INSURANCE

## 2011-10-26 ENCOUNTER — Encounter (HOSPITAL_COMMUNITY): Payer: PRIVATE HEALTH INSURANCE

## 2011-10-28 ENCOUNTER — Encounter (HOSPITAL_COMMUNITY): Payer: PRIVATE HEALTH INSURANCE

## 2011-10-31 ENCOUNTER — Encounter (HOSPITAL_COMMUNITY): Payer: PRIVATE HEALTH INSURANCE

## 2011-11-02 ENCOUNTER — Encounter (HOSPITAL_COMMUNITY): Payer: PRIVATE HEALTH INSURANCE

## 2011-11-04 ENCOUNTER — Encounter (HOSPITAL_COMMUNITY): Payer: PRIVATE HEALTH INSURANCE

## 2011-11-07 ENCOUNTER — Encounter (HOSPITAL_COMMUNITY): Payer: PRIVATE HEALTH INSURANCE

## 2011-11-09 ENCOUNTER — Encounter (HOSPITAL_COMMUNITY): Payer: PRIVATE HEALTH INSURANCE

## 2011-11-11 ENCOUNTER — Encounter (HOSPITAL_COMMUNITY): Payer: PRIVATE HEALTH INSURANCE

## 2011-11-14 ENCOUNTER — Encounter (HOSPITAL_COMMUNITY): Payer: PRIVATE HEALTH INSURANCE

## 2011-11-16 ENCOUNTER — Encounter (HOSPITAL_COMMUNITY): Payer: PRIVATE HEALTH INSURANCE

## 2011-11-18 ENCOUNTER — Encounter (HOSPITAL_COMMUNITY): Payer: PRIVATE HEALTH INSURANCE

## 2011-11-21 ENCOUNTER — Encounter (HOSPITAL_COMMUNITY): Payer: PRIVATE HEALTH INSURANCE

## 2011-11-23 ENCOUNTER — Encounter (HOSPITAL_COMMUNITY): Payer: PRIVATE HEALTH INSURANCE

## 2011-11-25 ENCOUNTER — Encounter (HOSPITAL_COMMUNITY): Payer: PRIVATE HEALTH INSURANCE

## 2011-11-28 ENCOUNTER — Encounter (HOSPITAL_COMMUNITY): Payer: PRIVATE HEALTH INSURANCE

## 2011-11-30 ENCOUNTER — Encounter (HOSPITAL_COMMUNITY): Payer: PRIVATE HEALTH INSURANCE

## 2011-12-02 ENCOUNTER — Encounter (HOSPITAL_COMMUNITY): Payer: PRIVATE HEALTH INSURANCE

## 2011-12-05 ENCOUNTER — Encounter (HOSPITAL_COMMUNITY): Payer: PRIVATE HEALTH INSURANCE

## 2011-12-07 ENCOUNTER — Encounter (HOSPITAL_COMMUNITY): Payer: PRIVATE HEALTH INSURANCE

## 2011-12-09 ENCOUNTER — Encounter (HOSPITAL_COMMUNITY): Payer: PRIVATE HEALTH INSURANCE

## 2011-12-12 ENCOUNTER — Encounter (HOSPITAL_COMMUNITY): Payer: PRIVATE HEALTH INSURANCE

## 2011-12-14 ENCOUNTER — Encounter (HOSPITAL_COMMUNITY): Payer: PRIVATE HEALTH INSURANCE

## 2011-12-16 ENCOUNTER — Encounter (HOSPITAL_COMMUNITY): Payer: PRIVATE HEALTH INSURANCE

## 2011-12-19 ENCOUNTER — Encounter (HOSPITAL_COMMUNITY): Payer: PRIVATE HEALTH INSURANCE

## 2011-12-21 ENCOUNTER — Encounter (HOSPITAL_COMMUNITY): Payer: PRIVATE HEALTH INSURANCE

## 2011-12-23 ENCOUNTER — Encounter (HOSPITAL_COMMUNITY): Payer: PRIVATE HEALTH INSURANCE

## 2011-12-26 ENCOUNTER — Encounter (HOSPITAL_COMMUNITY): Payer: PRIVATE HEALTH INSURANCE

## 2011-12-28 ENCOUNTER — Encounter (HOSPITAL_COMMUNITY): Payer: PRIVATE HEALTH INSURANCE

## 2011-12-30 ENCOUNTER — Encounter (HOSPITAL_COMMUNITY): Payer: PRIVATE HEALTH INSURANCE

## 2012-01-02 ENCOUNTER — Encounter (HOSPITAL_COMMUNITY): Payer: PRIVATE HEALTH INSURANCE

## 2012-01-04 ENCOUNTER — Encounter (HOSPITAL_COMMUNITY): Payer: PRIVATE HEALTH INSURANCE

## 2012-01-06 ENCOUNTER — Encounter (HOSPITAL_COMMUNITY): Payer: PRIVATE HEALTH INSURANCE

## 2012-01-09 ENCOUNTER — Encounter (HOSPITAL_COMMUNITY): Payer: PRIVATE HEALTH INSURANCE

## 2012-01-11 ENCOUNTER — Encounter (HOSPITAL_COMMUNITY): Payer: PRIVATE HEALTH INSURANCE

## 2012-01-13 ENCOUNTER — Encounter (HOSPITAL_COMMUNITY): Payer: PRIVATE HEALTH INSURANCE

## 2012-01-16 ENCOUNTER — Encounter (HOSPITAL_COMMUNITY): Payer: PRIVATE HEALTH INSURANCE

## 2012-01-18 ENCOUNTER — Encounter (HOSPITAL_COMMUNITY): Payer: PRIVATE HEALTH INSURANCE

## 2012-01-20 ENCOUNTER — Encounter (HOSPITAL_COMMUNITY): Payer: PRIVATE HEALTH INSURANCE

## 2012-01-23 ENCOUNTER — Encounter (HOSPITAL_COMMUNITY): Payer: PRIVATE HEALTH INSURANCE

## 2012-01-25 ENCOUNTER — Encounter (HOSPITAL_COMMUNITY): Payer: PRIVATE HEALTH INSURANCE

## 2012-01-27 ENCOUNTER — Encounter (HOSPITAL_COMMUNITY): Payer: PRIVATE HEALTH INSURANCE

## 2012-01-30 ENCOUNTER — Encounter (HOSPITAL_COMMUNITY): Payer: PRIVATE HEALTH INSURANCE

## 2012-02-01 ENCOUNTER — Encounter (HOSPITAL_COMMUNITY): Payer: PRIVATE HEALTH INSURANCE

## 2012-02-03 ENCOUNTER — Encounter (HOSPITAL_COMMUNITY): Payer: PRIVATE HEALTH INSURANCE

## 2012-02-06 ENCOUNTER — Encounter (HOSPITAL_COMMUNITY): Payer: PRIVATE HEALTH INSURANCE

## 2012-02-08 ENCOUNTER — Encounter (HOSPITAL_COMMUNITY): Payer: PRIVATE HEALTH INSURANCE

## 2012-02-10 ENCOUNTER — Encounter (HOSPITAL_COMMUNITY): Payer: PRIVATE HEALTH INSURANCE

## 2012-04-11 DIAGNOSIS — Z2839 Other underimmunization status: Secondary | ICD-10-CM | POA: Insufficient documentation

## 2012-08-13 ENCOUNTER — Encounter (HOSPITAL_COMMUNITY): Payer: Self-pay | Admitting: *Deleted

## 2012-08-13 ENCOUNTER — Emergency Department (HOSPITAL_COMMUNITY)
Admission: EM | Admit: 2012-08-13 | Discharge: 2012-08-14 | Disposition: A | Discharge status: Home / Self Care | Payer: BC Managed Care – PPO | Attending: Emergency Medicine | Admitting: Emergency Medicine

## 2012-08-13 DIAGNOSIS — R142 Eructation: Secondary | ICD-10-CM | POA: Insufficient documentation

## 2012-08-13 DIAGNOSIS — E785 Hyperlipidemia, unspecified: Secondary | ICD-10-CM | POA: Insufficient documentation

## 2012-08-13 DIAGNOSIS — Z87442 Personal history of urinary calculi: Secondary | ICD-10-CM | POA: Insufficient documentation

## 2012-08-13 DIAGNOSIS — Z8719 Personal history of other diseases of the digestive system: Secondary | ICD-10-CM | POA: Insufficient documentation

## 2012-08-13 DIAGNOSIS — R1011 Right upper quadrant pain: Secondary | ICD-10-CM | POA: Insufficient documentation

## 2012-08-13 DIAGNOSIS — R141 Gas pain: Secondary | ICD-10-CM | POA: Insufficient documentation

## 2012-08-13 DIAGNOSIS — R109 Unspecified abdominal pain: Secondary | ICD-10-CM

## 2012-08-13 DIAGNOSIS — Z79899 Other long term (current) drug therapy: Secondary | ICD-10-CM | POA: Insufficient documentation

## 2012-08-13 DIAGNOSIS — Z87891 Personal history of nicotine dependence: Secondary | ICD-10-CM | POA: Insufficient documentation

## 2012-08-13 DIAGNOSIS — Z8679 Personal history of other diseases of the circulatory system: Secondary | ICD-10-CM | POA: Insufficient documentation

## 2012-08-13 DIAGNOSIS — I509 Heart failure, unspecified: Secondary | ICD-10-CM | POA: Insufficient documentation

## 2012-08-13 DIAGNOSIS — Z8669 Personal history of other diseases of the nervous system and sense organs: Secondary | ICD-10-CM | POA: Insufficient documentation

## 2012-08-13 DIAGNOSIS — Z8739 Personal history of other diseases of the musculoskeletal system and connective tissue: Secondary | ICD-10-CM | POA: Insufficient documentation

## 2012-08-13 DIAGNOSIS — Z7982 Long term (current) use of aspirin: Secondary | ICD-10-CM | POA: Insufficient documentation

## 2012-08-13 NOTE — ED Notes (Signed)
Pt BIB EMS. Pt c/o R sided abdominal pain just below ribs that radiates to back. Pain is worse with palpation. Pt told EMS that pain started at 1915 this evening. Pt states he has a hx of kidney stones and IBS.

## 2012-08-14 NOTE — ED Provider Notes (Signed)
Medical screening examination/treatment/procedure(s) were performed by non-physician practitioner and as supervising physician I was immediately available for consultation/collaboration.   Carlisle Beers Mol[place]us, MD 08/14/12 0130

## 2012-08-14 NOTE — ED Provider Notes (Signed)
History     CSN: [id number]  Arrival date & time 08/13/12  2217   First MD Initiated Contact with Patient 08/13/12 2342      Chief Complaint  Patient presents with  . Abdominal Pain    (Consider location/radiation/quality/duration/timing/severity/associated sxs/prior treatment) Patient is a 62 y.o. male presenting with abdominal pain. The history is provided by the patient.  Abdominal Pain Pain location:  RUQ Pain quality: fullness and sharp   Pain radiates to:  Back Pain severity:  Moderate Onset quality:  Gradual Progression:  Resolved Chronicity:  New Associated symptoms: no chest pain, no cough, no dysuria, no fever, no nausea, no shortness of breath and no vomiting   Associated symptoms comment:  RUQ pain and abdominal bloating that began earlier this evening. No N, V, D. He states that he felt "full of gas" and was passing flatus without relief. No belching. No fever. He denies similar symptoms in the past.    Past Medical History  Diagnosis Date  . MVP (mitral valve prolapse) WITH SEVERE MITRAL REGURG  . CHF (congestive heart failure) CHRONIC SYSTOLIC WITH ACUTE EXACERBATIONS  . Palpitations WITH PREMATURE VENTRICULAR COTRACTIONS AND PREMATURE ATRIAL CONTRACTIONS  . Rheumatic fever REPORTED HISTORY  . Nephrolithiasis   . Varicose veins   . GERD (gastroesophageal reflux disease)   . Hyperlipemia   . Sensorineural hearing loss   . Weakness of right upper extremity RELATED TO RUPTURED BICEPS TENDON    Past Surgical History  Procedure Laterality Date  . Multiple surgical procedures in his use for    . The patient has also had    . Inguinal hernia repair    . Tonsillectomy    . Hemorrhoid surgery    . Mitral valve repair  08/19/2010    complex valvuloplasty with 32mm ring annuloplasty via right mini thoracotomy (Dr. Cornelius Moras)    History reviewed. No pertinent family history.  History  Substance Use Topics  . Smoking status: Former Smoker    Quit date: 08/12/1994   . Smokeless tobacco: Not on file  . Alcohol Use: Not on file      Review of Systems  Constitutional: Negative for fever.  Respiratory: Negative.  Negative for cough and shortness of breath.   Cardiovascular: Negative for chest pain.  Gastrointestinal: Positive for abdominal pain. Negative for nausea and vomiting.  Genitourinary: Negative for dysuria.  Musculoskeletal: Negative for myalgias.  Skin: Negative for rash.  Neurological: Negative for headaches.    Allergies  Atenolol and Nadolol  Home Medications   Current Outpatient Rx  Name  Route  Sig  Dispense  Refill  . aspirin 81 MG tablet   Oral   Take 81 mg by mouth daily.           Marland Kitchen levothyroxine (SYNTHROID, LEVOTHROID) 100 MCG tablet   Oral   Take 100 mcg by mouth daily.         Marland Kitchen lisinopril (PRINIVIL,ZESTRIL) 2.5 MG tablet   Oral   Take 2.5 mg by mouth daily.         . metoprolol (TOPROL-XL) 100 MG 24 hr tablet   Oral   Take 100 mg by mouth daily.           . simvastatin (ZOCOR) 20 MG tablet   Oral   Take 20 mg by mouth every evening. Pt states taking every other day per choice           BP 130/75  Pulse 74  Temp(Src) 98.1 F (36.7  C) (Oral)  Resp 18  SpO2 100%  Physical Exam  Constitutional: He is oriented to person, place, and time. He appears well-developed and well-nourished. No distress.  HENT:  Mouth/Throat: Oropharynx is clear and moist.  Neck: Normal range of motion.  Cardiovascular: Normal rate and regular rhythm.   No murmur heard. Pulmonary/Chest: Effort normal. He has no wheezes. He has no rales.  Abdominal: Soft. Bowel sounds are normal. He exhibits no mass. There is no tenderness. There is no rebound and no guarding.  Musculoskeletal: Normal range of motion.  Neurological: He is alert and oriented to person, place, and time.  Skin: Skin is warm and dry.  Psychiatric: He has a normal mood and affect.    ED Course  Procedures (including critical care time)  Labs  Reviewed - No data to display No results found.   No diagnosis found.  1. Abdominal pain, resolved  MDM  No tenderness on exam. He reports complete resolution of symptoms. VSS. Will arrange for outpatient [place] to evaluate gall bladder. Discussed with patient who agrees with plan and is comfortable with discharge.         Arnoldo Hooker, PA-C 08/14/12 0030

## 2012-08-15 ENCOUNTER — Ambulatory Visit (HOSPITAL_COMMUNITY)
Admission: RE | Admit: 2012-08-15 | Discharge: 2012-08-15 | Disposition: A | Discharge status: Home / Self Care | Payer: BC Managed Care – PPO | Source: Ambulatory Visit | Attending: Emergency Medicine | Admitting: Emergency Medicine

## 2012-08-15 DIAGNOSIS — K8020 Calculus of gallbladder without cholecystitis without obstruction: Secondary | ICD-10-CM | POA: Insufficient documentation

## 2012-08-15 DIAGNOSIS — R1011 Right upper quadrant pain: Secondary | ICD-10-CM | POA: Insufficient documentation

## 2012-08-15 DIAGNOSIS — Q619 Cystic kidney disease, unspecified: Secondary | ICD-10-CM | POA: Insufficient documentation

## 2012-08-15 DIAGNOSIS — K838 Other specified diseases of biliary tract: Secondary | ICD-10-CM | POA: Insufficient documentation

## 2012-08-30 ENCOUNTER — Ambulatory Visit (INDEPENDENT_AMBULATORY_CARE_PROVIDER_SITE_OTHER): Payer: BC Managed Care – PPO | Admitting: Surgery

## 2012-08-30 ENCOUNTER — Encounter (INDEPENDENT_AMBULATORY_CARE_PROVIDER_SITE_OTHER): Payer: Self-pay | Admitting: Surgery

## 2012-08-30 VITALS — BP 110/72 | HR 66 | Temp 98.8°F | Resp 18 | Ht 67.5 in | Wt 147.6 lb

## 2012-08-30 DIAGNOSIS — K8020 Calculus of gallbladder without cholecystitis without obstruction: Secondary | ICD-10-CM

## 2012-08-30 NOTE — Patient Instructions (Signed)
See the Handout(s) we gave you.  Consider surgery.  Please call our office at [phone] if you wish to schedule surgery or if you have further questions / concerns.   Cholelithiasis Cholelithiasis (also called gallstones) is a form of gallbladder disease where gallstones form in your gallbladder. The gallbladder is a non-essential organ that stores bile made in the liver, which helps digest fats. Gallstones begin as small crystals and slowly grow into stones. Gallstone pain occurs when the gallbladder spasms, and a gallstone is blocking the duct. Pain can also occur when a stone passes out of the duct.  Women are more likely to develop gallstones than men. Other factors that increase the risk of gallbladder disease are:  Having multiple pregnancies. Physicians sometimes advise removing diseased gallbladders before future pregnancies.  Obesity.  Diets heavy in fried foods and fat.  Increasing age (older than 94).  Prolonged use of medications containing male hormones.  Diabetes mellitus.  Rapid weight loss.  Family history of gallstones (heredity). SYMPTOMS  Feeling sick to your stomach (nauseous).  Abdominal pain.  Yellowing of the skin (jaundice).  Sudden pain. It may persist from several minutes to several hours.  Worsening pain with deep breathing or when jarred.  Fever.  Tenderness to the touch. In some cases, when gallstones do not move into the bile duct, people have no pain or symptoms. These are called "silent" gallstones. TREATMENT In severe cases, emergency surgery may be required. HOME CARE INSTRUCTIONS   Only take over-the-counter or prescription medicines for pain, discomfort, or fever as directed by your caregiver.  Follow a low-fat diet until seen again. Fat causes the gallbladder to contract, which can result in pain.  Follow up as instructed. Attacks are almost always recurrent and surgery is usually required for permanent treatment. SEEK  IMMEDIATE MEDICAL CARE IF:   Your pain increases and is not controlled by medications.  You have an oral temperature above 102 F (38.9 C), not controlled by medication.  You develop nausea and vomiting. MAKE SURE YOU:   Understand these instructions.  Will watch your condition.  Will get help right away if you are not doing well or get worse. Document Released: 05/26/2005 Document Revised: 08/22/2011 Document Reviewed: 07/29/2010 [facility] Patient Information 2013 [place], [place].  LAPAROSCOPIC SURGERY: POST OP INSTRUCTIONS  1. DIET: Follow a light bland diet the first 24 hours after arrival home, such as soup, liquids, crackers, etc.  Be sure to include lots of fluids daily.  Avoid fast food or heavy meals as your are more likely to get nauseated.  Eat a low fat the next few days after surgery.   2. Take your usually prescribed home medications unless otherwise directed. 3. PAIN CONTROL: a. Pain is best controlled by a usual combination of three different methods TOGETHER: i. Ice/Heat ii. Over the counter pain medication iii. Prescription pain medication b. Most patients will experience some swelling and bruising around the incisions.  Ice packs or heating pads (30-60 minutes up to 6 times a day) will help. Use ice for the first few days to help decrease swelling and bruising, then switch to heat to help relax tight/sore spots and speed recovery.  Some people prefer to use ice alone, heat alone, alternating between ice & heat.  Experiment to what works for you.  Swelling and bruising can take several weeks to resolve.   c. It is helpful to take an over-the-counter pain medication regularly for the first few weeks.  Choose one of the  following that works best for you: i. Naproxen (Aleve, etc)  Two 220mg  tabs twice a day ii. Ibuprofen (Advil, etc) Three 200mg  tabs four times a day (every meal & bedtime) iii. Acetaminophen (Tylenol, etc) 500-650mg  four times a day (every meal &  bedtime) d. A  prescription for pain medication (such as oxycodone, hydrocodone, etc) should be given to you upon discharge.  Take your pain medication as prescribed.  i. If you are having problems/concerns with the prescription medicine (does not control pain, nausea, vomiting, rash, itching, etc), please call us [phone] to see if we need to switch you to a different pain medicine that will work better for you and/or control your side effect better. ii. If you need a refill on your pain medication, please contact your pharmacy.  They will contact our office to request authorization. Prescriptions will not be filled after 5 pm or on week-ends. 4. Avoid getting constipated.  Between the surgery and the pain medications, it is common to experience some constipation.  Increasing fluid intake and taking a fiber supplement (such as Metamucil, Citrucel, FiberCon, MiraLax, etc) 1-2 times a day regularly will usually help prevent this problem from occurring.  A mild laxative (prune juice, Milk of Magnesia, MiraLax, etc) should be taken according to package directions if there are no bowel movements after 48 hours.   5. Watch out for diarrhea.  If you have many loose bowel movements, simplify your diet to bland foods & liquids for a few days.  Stop any stool softeners and decrease your fiber supplement.  Switching to mild anti-diarrheal medications (Kayopectate, Pepto Bismol) can help.  If this worsens or does not improve, please call us. 6. Wash / shower every day.  You may shower over the dressings as they are waterproof.  Continue to shower over incision(s) after the dressing is off. 7. Remove your waterproof bandages 5 days after surgery.  You may leave the incision open to air.  You may replace a dressing/Band-Aid to cover the incision for comfort if you wish.  8. ACTIVITIES as tolerated:   a. You may resume regular (light) daily activities beginning the next day-such as daily self-care, walking,  climbing stairs-gradually increasing activities as tolerated.  If you can walk 30 minutes without difficulty, it is safe to try more intense activity such as jogging, treadmill, bicycling, low-impact aerobics, swimming, etc. b. Save the most intensive and strenuous activity for last such as sit-ups, heavy lifting, contact sports, etc  Refrain from any heavy lifting or straining until you are off narcotics for pain control.   c. DO NOT PUSH THROUGH PAIN.  Let pain be your guide: If it hurts to do something, don't do it.  Pain is your body warning you to avoid that activity for another week until the pain goes down. d. You may drive when you are no longer taking prescription pain medication, you can comfortably wear a seatbelt, and you can safely maneuver your car and apply brakes. e. Bonita Quin may have sexual intercourse when it is comfortable.  9. FOLLOW UP in our office a. Please call [facility] at [phone] to set up an appointment to see your surgeon in the office for a follow-up appointment approximately 2-3 weeks after your surgery. b. Make sure that you call for this appointment the day you arrive home to insure a convenient appointment time. 10. IF YOU HAVE DISABILITY OR FAMILY LEAVE FORMS, BRING THEM TO THE OFFICE FOR PROCESSING.  DO NOT GIVE THEM TO  YOUR DOCTOR.   WHEN TO CALL us [phone]: 1. Poor pain control 2. Reactions / problems with new medications (rash/itching, nausea, etc)  3. Fever over 101.5 F (38.5 C) 4. Inability to urinate 5. Nausea and/or vomiting 6. Worsening swelling or bruising 7. Continued bleeding from incision. 8. Increased pain, redness, or drainage from the incision   The clinic staff is available to answer your questions during regular business hours (8:30am-5pm).  Please don't hesitate to call and ask to speak to one of our nurses for clinical concerns.   If you have a medical emergency, go to the nearest emergency room or call 911.  A surgeon from [facility] is always on call at the [facility], [place] [address], [address], [place], [place]  [address] ? MAIN: [phone] ? TOLL FREE: [phone] ?  FAX [phone] [email or web address]

## 2012-08-30 NOTE — Progress Notes (Signed)
Subjective:     Patient ID: TERRAN KLINKE, male   DOB: 1950/07/21, 62 y.o.   MRN: [id number]  HPI  ABANOUB HANKEN  [birth year] [id number]  Patient Care Team: Jarome Matin as PCP - General (Surgery) Othella Boyer, MD as Attending Physician (Cardiology) Arnoldo Hooker, PA-C as Physician Assistant (Emergency Medicine) Hanley Seamen, MD as Referring Physician (Emergency Medicine) Purcell Nails, MD as Consulting Physician (Cardiothoracic Surgery)  This patient is a 62 y.o.male who presents today for surgical evaluation at the request of Dr. Read Drivers.   Reason for visit: Right upper quadrant pain with gallstones.  Probable biliary etiology.  Pleasant active male.  History of valvular problems.  Status post mitral valve repair in 2012.  Change to low-fat primarily vegetarian diet and lost 40 pounds.  More active.  Had an episode of severe pain in his right upper quadrant after eating a frozen pizza.  Radiate to his back.  It lasted for several hours.  He called his primary care physician.  Dr. Jarold Motto recommended ER evaluation.  Pain began to wane upon arrival to the emergency room.  Workup showed gallstones.  Suspected biliary etiology.  The patient was sent to me for surgical evaluation.  Patient normally has a bowel movement about every day.  No bad bouts of constipation or diarrhea.  Some rumblings and bloating.  Has had prior workups for intermittent abdominal pains in the past.  This is not classic for that.  No history of hepatitis.  He has one beer a month.  No personal nor family history of GI/colon cancer, inflammatory bowel disease, irritable bowel syndrome, allergy such as Celiac Sprue, dietary/dairy problems, colitis, ulcers nor gastritis.  No recent sick contacts/gastroenteritis.  No travel outside the country.  No changes in diet.    Patient Active Problem List  Diagnosis  . MIGRAINE HEADACHE  . HEARING LOSS  . HYPERTENSION  . MITRAL VALVE PROLAPSE  . PREMATURE ATRIAL  CONTRACTIONS  . ARTHRITIS  . NEPHROLITHIASIS, HX OF  . MVP (mitral valve prolapse)  . CHF (congestive heart failure)  . Palpitations  . Rheumatic fever  . Nephrolithiasis  . Hyperlipemia  . Sensorineural hearing loss  . Weakness of right upper extremity    Past Medical History  Diagnosis Date  . MVP (mitral valve prolapse) WITH SEVERE MITRAL REGURG  . CHF (congestive heart failure) CHRONIC SYSTOLIC WITH ACUTE EXACERBATIONS  . Palpitations WITH PREMATURE VENTRICULAR COTRACTIONS AND PREMATURE ATRIAL CONTRACTIONS  . Rheumatic fever REPORTED HISTORY  . Nephrolithiasis   . Varicose veins   . GERD (gastroesophageal reflux disease)   . Hyperlipemia   . Sensorineural hearing loss   . Weakness of right upper extremity RELATED TO RUPTURED BICEPS TENDON  . Arthritis     Past Surgical History  Procedure Laterality Date  . Multiple surgical procedures in his use for    . The patient has also had    . Inguinal hernia repair    . Tonsillectomy    . Hemorrhoid surgery    . Mitral valve repair  08/19/2010    complex valvuloplasty with 32mm ring annuloplasty via right mini thoracotomy (Dr. Cornelius Moras)  . Rotator cuff repair    . Hernia repair    . Ureter ectopic resection    . Hydrocele excision / repair      History   Social History  . Marital Status: Single    Spouse Name: N/A    Number of Children: N/A  . Years of  Education: N/A   Occupational History  . Not on file.   Social History Main Topics  . Smoking status: Former Smoker    Types: Cigarettes    Quit date: 08/12/1994  . Smokeless tobacco: Never Used  . Alcohol Use: 0.6 oz/week    1 Cans of beer per week     Comment: Monthly.  . Drug Use: No  . Sexually Active: Not on file   Other Topics Concern  . Not on file   Social History Narrative  . No narrative on file    Family History  Problem Relation Age of Onset  . Heart attack Mother   . Angina Mother   . Hypertension Mother   . Dementia Mother   . Seizures  Father   . Hypertension Sister   . Hypertension Brother     Current Outpatient Prescriptions  Medication Sig Dispense Refill  . aspirin 81 MG tablet Take 81 mg by mouth daily.        Marland Kitchen levothyroxine (SYNTHROID, LEVOTHROID) 100 MCG tablet Take 100 mcg by mouth daily.      Marland Kitchen lisinopril (PRINIVIL,ZESTRIL) 2.5 MG tablet Take 2.5 mg by mouth daily.      . metoprolol (TOPROL-XL) 100 MG 24 hr tablet Take 100 mg by mouth daily.        . simvastatin (ZOCOR) 20 MG tablet Take 20 mg by mouth every evening. Pt states taking every other day per choice       No current facility-administered medications for this visit.     Allergies  Allergen Reactions  . Atenolol     Stated doctor told him he was allergic.  . Nadolol     BP 110/72  Pulse 66  Temp(Src) 98.8 F (37.1 C) (Temporal)  Resp 18  Ht 5' 7.5" (1.715 m)  Wt 147 lb 9.6 oz (66.951 kg)  BMI 22.76 kg/m2  US Abdomen Complete  08/15/2012  *RADIOLOGY REPORT*  Clinical Data:  62 year old male with right upper quadrant pain.  COMPLETE ABDOMINAL ULTRASOUND  Comparison:  CT abdomen without contrast 02/06/2005.  Findings:  Gallbladder:  Dependent sludge and several shadowing echogenic gallstones are present.  Individual stones measure up to 7 mm diameter.  No sonographic Murphy's sign elicited and gallbladder wall thickness is at the upper limits of normal, 3 mm.  No pericholecystic fluid.  Common bile duct:  Normal, 3 mm diameter.  Liver:  No focal lesion identified.  Within normal limits in parenchymal echogenicity.  IVC:  Appears normal.  Pancreas:  Incompletely visualized due to overlying bowel gas, visualized portions within normal limits.  Spleen:  Occasional punctate hyperechoic foci which may represent tiny granulomas, not correlated on prior.  Normal sized 6.7 cm in length.  Right Kidney:  No hydronephrosis.  Evidence of a small simple appearing cortical cyst measuring up to 9 mm in diameter. Right renal length 9.3 cm.  Left Kidney:  No  hydronephrosis.  Renal length 11.4 cm.  Chronic exophytic upper pole cyst now measures up to 48 mm diameter and appears simple (37 mm in 2006).  Smaller 12 mm adjacent simple appearing cyst.  Abdominal aorta:  Incompletely visualized due to overlying bowel gas, visualized portions within normal limits.  IMPRESSION: 1.  Gallbladder sludge and stones without sonographic evidence of acute cholecystitis. 2.  Chronic simple appearing renal cysts.   Original Report Authenticated By: Erskine Speed, M.D.      Review of Systems  Constitutional: Negative for fever, chills and diaphoresis.  HENT:  Positive for hearing loss. Negative for nosebleeds, sore throat, facial swelling, mouth sores, trouble swallowing and ear discharge.   Eyes: Negative for photophobia, discharge and visual disturbance.  Respiratory: Negative for choking, chest tightness, shortness of breath and stridor.   Cardiovascular: Negative for chest pain and palpitations.  Gastrointestinal: Positive for nausea and abdominal pain. Negative for vomiting, diarrhea, constipation, blood in stool, abdominal distention, anal bleeding and rectal pain.  Genitourinary: Negative for dysuria, urgency, difficulty urinating and testicular pain.  Musculoskeletal: Negative for myalgias, back pain, arthralgias and gait problem.  Skin: Negative for color change, pallor, rash and wound.  Neurological: Negative for dizziness, speech difficulty, weakness, numbness and headaches.  Hematological: Negative for adenopathy. Does not bruise/bleed easily.  Psychiatric/Behavioral: Negative for hallucinations, confusion and agitation.       Objective:   Physical Exam  Constitutional: He is oriented to person, place, and time. He appears well-developed and well-nourished. No distress.  HENT:  Head: Normocephalic.  Mouth/Throat: Oropharynx is clear and moist. No oropharyngeal exudate.  Eyes: Conjunctivae and EOM are normal. Pupils are equal, round, and reactive to  light. No scleral icterus.  Neck: Normal range of motion. Neck supple. No tracheal deviation present.  Cardiovascular: Normal rate, regular rhythm and intact distal pulses.   Pulmonary/Chest: Effort normal and breath sounds normal. No respiratory distress.  Abdominal: Soft. He exhibits no distension and no pulsatile liver. There is no tenderness. There is no rigidity, no guarding, no CVA tenderness, no tenderness at McBurney's point and negative Murphy's sign. No hernia. Hernia confirmed negative in the ventral area, confirmed negative in the right inguinal area and confirmed negative in the left inguinal area.    Musculoskeletal: Normal range of motion. He exhibits no tenderness.  Lymphadenopathy:    He has no cervical adenopathy.       Right: No inguinal adenopathy present.       Left: No inguinal adenopathy present.  Neurological: He is alert and oriented to person, place, and time. No cranial nerve deficit. He exhibits normal muscle tone. Coordination normal.  Skin: Skin is warm and dry. No rash noted. He is not diaphoretic. No erythema. No pallor.  Psychiatric: He has a normal mood and affect. His behavior is normal. Judgment and thought content normal.       Assessment:     Classic episode of biliary colic.  Ultrasound positive for gallstones/sludge.  Rest of the differential diagnosis unlikely.     Plan:     I think he would benefit from cholecystectomy.  Reasonable single site candidate.  First attack severe enough to come the emergency room.  Rest of the differential diagnosis unlikely.  It is inconvenient time for him.  He is leaning more toward surgery but wants to think about things first.  I discussed the procedure with him:  The anatomy & physiology of hepatobiliary & pancreatic function was discussed.  The pathophysiology of gallbladder dysfunction was discussed.  Natural history risks without surgery was discussed.   I feel the risks of no intervention will lead to serious  problems that outweigh the operative risks; therefore, I recommended cholecystectomy to remove the pathology.  I explained laparoscopic techniques with possible need for an open approach.  Probable cholangiogram to evaluate the bilary tract was explained as well.    Risks such as bleeding, infection, abscess, leak, injury to other organs, need for further treatment, heart attack, death, and other risks were discussed.  I noted a good likelihood this will help address the problem.  Possibility that this will not correct all abdominal symptoms was explained.  Goals of post-operative recovery were discussed as well.  We will work to minimize complications.  An educational handout further explaining the pathology and treatment options was given as well.  Questions were answered.  The patient expresses understanding & wishes to proceed with surgery.

## 2012-09-09 IMAGING — CR DG CHEST 1V PORT
1 series · 1 of 1 positions shown · non-contrast
Comparison: 08/19/2010.

CLINICAL DATA: Status post CABG.

PORTABLE CHEST - 1 VIEW

[AP]
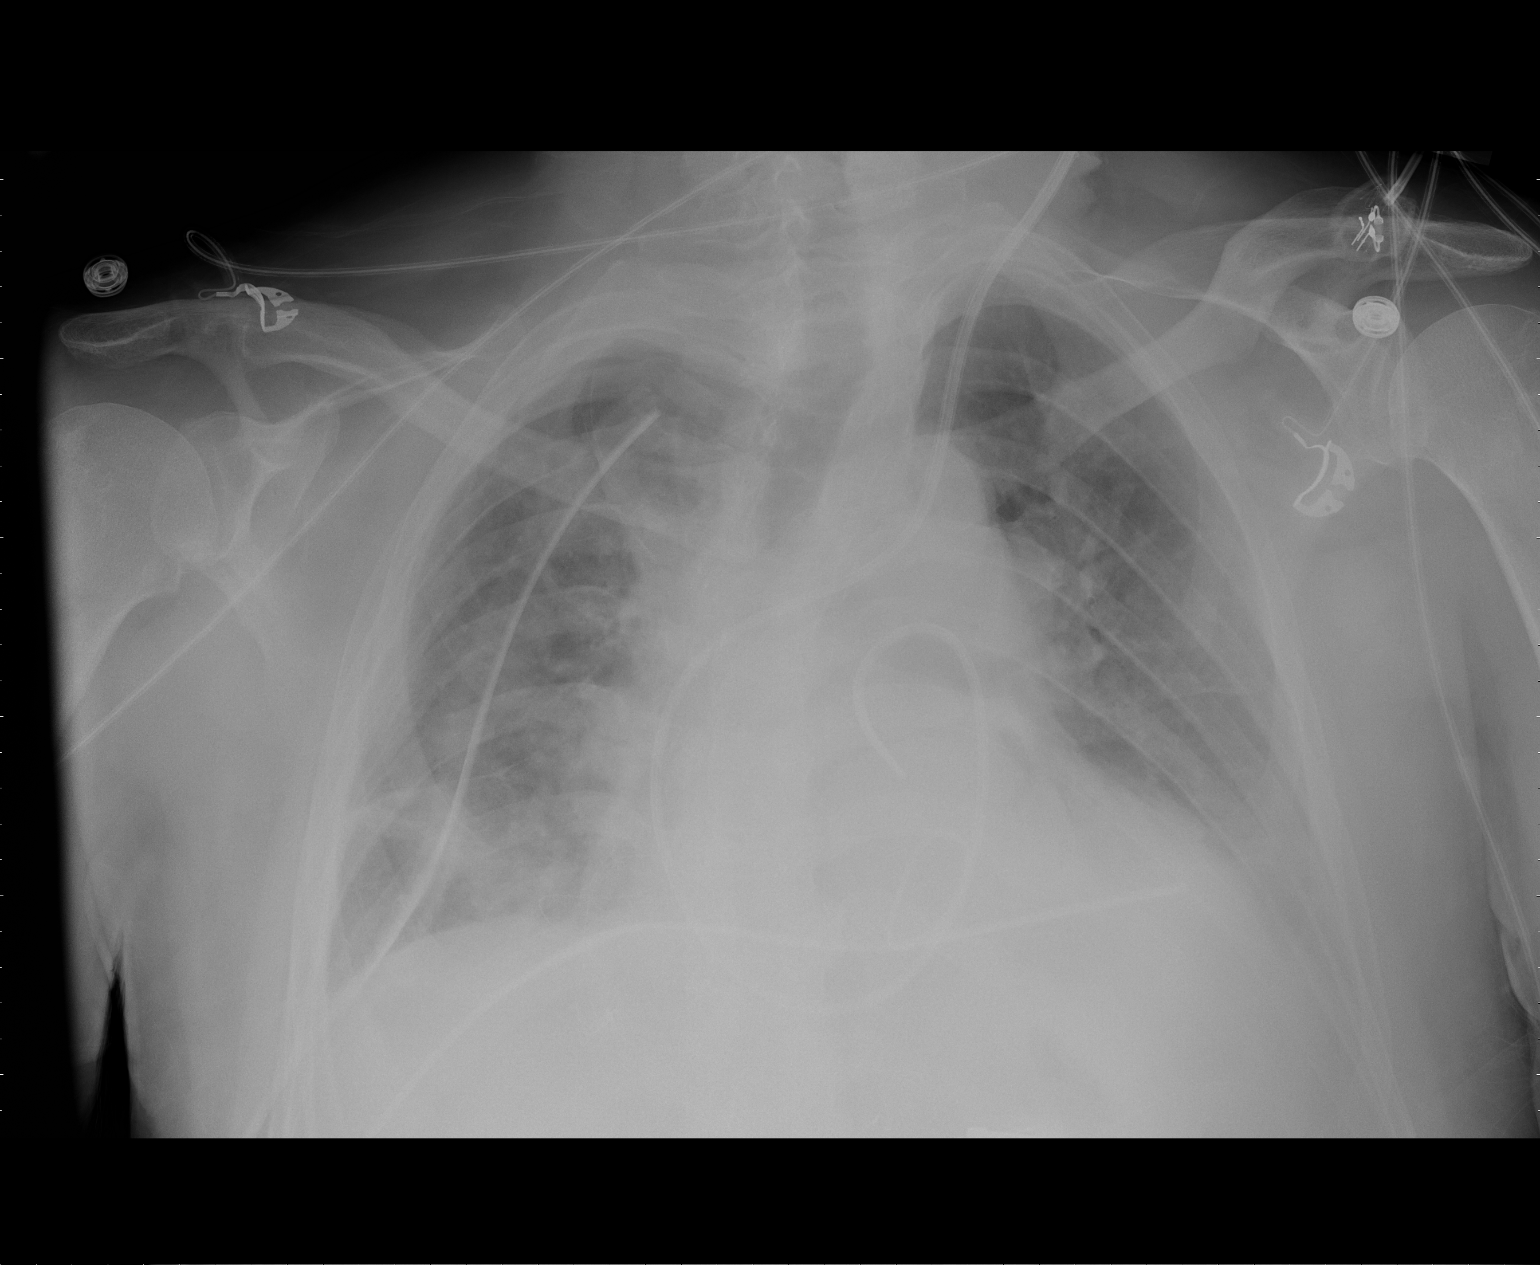

[1 of 1 positions shown; findings below may reference images not displayed]

FINDINGS: Stable mediastinal and right chest tubes with no
pneumothorax seen.  The left jugular Swan-Ganz catheter remains
coiled in the main pulmonary artery. Increased bibasilar airspace
opacity, especially on the left.  Enlarged cardiac silhouette
without significant change when a decreased depth of inspiration is
taken into account.  Prosthetic heart valve.  Unremarkable bones.
IMPRESSION: 1.  Increased bibasilar atelectasis or pneumonia.
2.  Stable cardiomegaly.

## 2012-10-01 ENCOUNTER — Encounter (INDEPENDENT_AMBULATORY_CARE_PROVIDER_SITE_OTHER): Payer: Self-pay

## 2012-10-03 ENCOUNTER — Telehealth (INDEPENDENT_AMBULATORY_CARE_PROVIDER_SITE_OTHER): Payer: Self-pay

## 2012-10-03 NOTE — Telephone Encounter (Signed)
Called [name] to notify him that he will need an appt with Dr York Spaniel office to get clearance b/c their office sent me a note back stating the [name] would need appt. I wanted to let the [name] know this info so when he decides on the surgery he will be aware that he must see Dr Donnie Aho first in order to clearance. The [name] still does not have a decision about surgery so he will call us when he decides.

## 2013-03-13 DIAGNOSIS — M25519 Pain in unspecified shoulder: Secondary | ICD-10-CM | POA: Insufficient documentation

## 2013-04-18 ENCOUNTER — Other Ambulatory Visit: Payer: Self-pay

## 2013-07-11 ENCOUNTER — Emergency Department (HOSPITAL_COMMUNITY): Payer: Medicare HMO

## 2013-07-11 ENCOUNTER — Emergency Department (HOSPITAL_COMMUNITY)
Admission: EM | Admit: 2013-07-11 | Discharge: 2013-07-12 | Disposition: A | Discharge status: Home / Self Care | Payer: Medicare HMO | Attending: Emergency Medicine | Admitting: Emergency Medicine

## 2013-07-11 ENCOUNTER — Encounter (HOSPITAL_COMMUNITY): Payer: Self-pay | Admitting: Emergency Medicine

## 2013-07-11 DIAGNOSIS — Z8669 Personal history of other diseases of the nervous system and sense organs: Secondary | ICD-10-CM | POA: Insufficient documentation

## 2013-07-11 DIAGNOSIS — M129 Arthropathy, unspecified: Secondary | ICD-10-CM | POA: Insufficient documentation

## 2013-07-11 DIAGNOSIS — E785 Hyperlipidemia, unspecified: Secondary | ICD-10-CM | POA: Insufficient documentation

## 2013-07-11 DIAGNOSIS — Z7982 Long term (current) use of aspirin: Secondary | ICD-10-CM | POA: Insufficient documentation

## 2013-07-11 DIAGNOSIS — Z87891 Personal history of nicotine dependence: Secondary | ICD-10-CM | POA: Insufficient documentation

## 2013-07-11 DIAGNOSIS — Z87442 Personal history of urinary calculi: Secondary | ICD-10-CM | POA: Insufficient documentation

## 2013-07-11 DIAGNOSIS — Z8719 Personal history of other diseases of the digestive system: Secondary | ICD-10-CM | POA: Insufficient documentation

## 2013-07-11 DIAGNOSIS — F419 Anxiety disorder, unspecified: Secondary | ICD-10-CM

## 2013-07-11 DIAGNOSIS — Z79899 Other long term (current) drug therapy: Secondary | ICD-10-CM | POA: Insufficient documentation

## 2013-07-11 DIAGNOSIS — I509 Heart failure, unspecified: Secondary | ICD-10-CM | POA: Insufficient documentation

## 2013-07-11 DIAGNOSIS — I4949 Other premature depolarization: Secondary | ICD-10-CM | POA: Insufficient documentation

## 2013-07-11 DIAGNOSIS — R0789 Other chest pain: Secondary | ICD-10-CM | POA: Insufficient documentation

## 2013-07-11 DIAGNOSIS — F411 Generalized anxiety disorder: Secondary | ICD-10-CM | POA: Insufficient documentation

## 2013-07-11 DIAGNOSIS — I493 Ventricular premature depolarization: Secondary | ICD-10-CM

## 2013-07-11 LAB — POCT I-STAT, CHEM 8
BUN: 27 mg/dL — ABNORMAL HIGH (ref 6–23)
CREATININE: 1.1 mg/dL (ref 0.50–1.35)
Calcium, Ion: 1.25 mmol/L (ref 1.13–1.30)
Chloride: 101 mEq/L (ref 96–112)
Glucose, Bld: 95 mg/dL (ref 70–99)
HEMATOCRIT: 41 % (ref 39.0–52.0)
HEMOGLOBIN: 13.9 g/dL (ref 13.0–17.0)
POTASSIUM: 3.6 meq/L — AB (ref 3.7–5.3)
SODIUM: 141 meq/L (ref 137–147)
TCO2: 28 mmol/L (ref 0–100)

## 2013-07-11 LAB — CBC WITH DIFFERENTIAL/PLATELET
BASOS ABS: 0 10*3/uL (ref 0.0–0.1)
BASOS PCT: 0 % (ref 0–1)
EOS ABS: 0.1 10*3/uL (ref 0.0–0.7)
EOS PCT: 1 % (ref 0–5)
HEMATOCRIT: 39 % (ref 39.0–52.0)
Hemoglobin: 13.6 g/dL (ref 13.0–17.0)
Lymphocytes Relative: 25 % (ref 12–46)
Lymphs Abs: 1.5 10*3/uL (ref 0.7–4.0)
MCH: 31.1 pg (ref 26.0–34.0)
MCHC: 34.9 g/dL (ref 30.0–36.0)
MCV: 89 fL (ref 78.0–100.0)
MONO ABS: 0.5 10*3/uL (ref 0.1–1.0)
Monocytes Relative: 8 % (ref 3–12)
Neutro Abs: 3.9 10*3/uL (ref 1.7–7.7)
Neutrophils Relative %: 65 % (ref 43–77)
PLATELETS: 189 10*3/uL (ref 150–400)
RBC: 4.38 MIL/uL (ref 4.22–5.81)
RDW: 12.9 % (ref 11.5–15.5)
WBC: 6 10*3/uL (ref 4.0–10.5)

## 2013-07-11 LAB — BASIC METABOLIC PANEL
BUN: 27 mg/dL — ABNORMAL HIGH (ref 6–23)
CALCIUM: 9.1 mg/dL (ref 8.4–10.5)
CO2: 28 meq/L (ref 19–32)
CREATININE: 1.01 mg/dL (ref 0.50–1.35)
Chloride: 102 mEq/L (ref 96–112)
GFR calc Af Amer: 89 mL/min — ABNORMAL LOW (ref >90)
GFR, EST NON AFRICAN AMERICAN: 77 mL/min — AB (ref >90)
Glucose, Bld: 94 mg/dL (ref 70–99)
Potassium: 4 mEq/L (ref 3.7–5.3)
SODIUM: 142 meq/L (ref 137–147)

## 2013-07-11 LAB — POCT I-STAT TROPONIN I: Troponin i, poc: 0 ng/mL (ref 0.00–0.08)

## 2013-07-11 NOTE — ED Provider Notes (Signed)
CSN: [id number]     Arrival date & time 07/11/13  2225 History   First MD Initiated Contact with Patient 07/11/13 2300     Chief Complaint  Patient presents with  . Chest Pain    Anxiety   (Consider location/radiation/quality/duration/timing/severity/associated sxs/prior Treatment) HPI Comments: Pt arrives by EMS - cc of CP.  Hx of anxiety, CHF, (rheumatic heart disease with severe MR s/p repair), prior smoker.  3/12 - has had MR repair.  Echo after repair showed EF of 56%, normal systolic function.  The congestive heart failure was caused due to the mitral regurgitation and has not had any symptoms since the surgery. He also had atrial fibrillation after the surgery which resolved after 2 days and has not returned.  CP started earlier in the day. He states that several times over the course of the afternoon and evening he has had a sensation in his epigastrium which he states feels like "anxiety". He also terms the symptoms as "indigestion and bloating". The patient is very active and states that he has started "hooping" which is a hula hooping class that he takes on Wednesdays - last evening he had no difficulty with this, with his other cardio exertion activities he has no difficulty including no pain, no shortness of breath. He had a clean cardiac catheterization prior to his surgery 2 years ago.  Currently the patient has no symptoms, he had no associated nausea or shortness of breath no swelling of the legs, no diaphoresis, no radiation of his symptoms to the back, shoulder, arm or the jaw.      Patient is a 63 y.o. male presenting with chest pain. The history is provided by the patient.  Chest Pain   Past Medical History  Diagnosis Date  . MVP (mitral valve prolapse) WITH SEVERE MITRAL REGURG  . CHF (congestive heart failure) CHRONIC SYSTOLIC WITH ACUTE EXACERBATIONS  . Palpitations WITH PREMATURE VENTRICULAR COTRACTIONS AND PREMATURE ATRIAL CONTRACTIONS  . Rheumatic fever REPORTED  HISTORY  . Nephrolithiasis   . Varicose veins   . GERD (gastroesophageal reflux disease)   . Hyperlipemia   . Sensorineural hearing loss   . Weakness of right upper extremity RELATED TO RUPTURED BICEPS TENDON  . Arthritis    Past Surgical History  Procedure Laterality Date  . Multiple surgical procedures in his use for    . The patient has also had    . Inguinal hernia repair Right     Dr. Deon Pilling  . Tonsillectomy    . Hemorrhoid surgery    . Mitral valve repair  08/19/2010    Dr. Ricard Dillon.  complex valvuloplasty with 6mm ring annuloplasty via right mini thoracotomy (Dr. Roxy Manns)  . Rotator cuff repair    . Hernia repair    . Ureter ectopic resection Left   . Hydrocele excision / repair Left    Family History  Problem Relation Age of Onset  . Heart attack Mother   . Angina Mother   . Hypertension Mother   . Dementia Mother   . Seizures Father   . Hypertension Sister   . Hypertension Brother    History  Substance Use Topics  . Smoking status: Former Smoker    Types: Cigarettes    Quit date: 08/12/1994  . Smokeless tobacco: Never Used  . Alcohol Use: 0.6 oz/week    1 Cans of beer per week     Comment: Monthly.    Review of Systems  Cardiovascular: Positive for chest pain.  All  other systems reviewed and are negative.    Allergies  Atenolol and Nadolol  Home Medications   Current Outpatient Rx  Name  Route  Sig  Dispense  Refill  . aspirin 81 MG tablet   Oral   Take 81 mg by mouth daily.           Marland Kitchen levothyroxine (SYNTHROID, LEVOTHROID) 125 MCG tablet   Oral   Take 125 mcg by mouth daily before breakfast.         . lisinopril (PRINIVIL,ZESTRIL) 2.5 MG tablet   Oral   Take 2.5 mg by mouth daily.         . metoprolol (TOPROL-XL) 100 MG 24 hr tablet   Oral   Take 100 mg by mouth daily.           . simvastatin (ZOCOR) 20 MG tablet   Oral   Take 20 mg by mouth every evening. Pt states taking every other day per choice         . ALPRAZolam  (XANAX) 0.5 MG tablet   Oral   Take 1 tablet (0.5 mg total) by mouth at bedtime as needed for anxiety.   10 tablet   0    BP 192/102  Pulse 107  Resp 19  SpO2 100% Physical Exam  Nursing note and vitals reviewed. Constitutional: He appears well-developed and well-nourished. No distress.  HENT:  Head: Normocephalic and atraumatic.  Mouth/Throat: Oropharynx is clear and moist. No oropharyngeal exudate.  Eyes: Conjunctivae and EOM are normal. Pupils are equal, round, and reactive to light. Right eye exhibits no discharge. Left eye exhibits no discharge. No scleral icterus.  Neck: Normal range of motion. Neck supple. No JVD present. No thyromegaly present.  Cardiovascular: Normal rate, regular rhythm, normal heart sounds and intact distal pulses.  Exam reveals no gallop and no friction rub.   No murmur heard. Pulmonary/Chest: Effort normal and breath sounds normal. No respiratory distress. He has no wheezes. He has no rales.  Abdominal: Soft. Bowel sounds are normal. He exhibits no distension and no mass. There is no tenderness.  Musculoskeletal: Normal range of motion. He exhibits no edema and no tenderness.  Lymphadenopathy:    He has no cervical adenopathy.  Neurological: He is alert. Coordination normal.  Skin: Skin is warm and dry. No rash noted. No erythema.  Psychiatric: He has a normal mood and affect. His behavior is normal.    ED Course  Procedures (including critical care time) Labs Review Labs Reviewed  BASIC METABOLIC PANEL - Abnormal; Notable for the following:    BUN 27 (*)    GFR calc non Af Amer 77 (*)    GFR calc Af Amer 89 (*)    All other components within normal limits  POCT I-STAT, CHEM 8 - Abnormal; Notable for the following:    Potassium 3.6 (*)    BUN 27 (*)    All other components within normal limits  CBC WITH DIFFERENTIAL  POCT I-STAT TROPONIN I  POCT I-STAT TROPONIN I   Imaging Review Dg Chest 2 View  07/11/2013   CLINICAL DATA:  Chest pain   EXAM: CHEST  2 VIEW  COMPARISON:  09/06/2010  FINDINGS: Normal heart size and vascularity. Lungs clear. No focal pneumonia, collapse or consolidation. Eventration the right hemidiaphragm anteriorly. Azygos fissure noted in the right upper lobe. Mild upper thoracic scoliosis.  IMPRESSION: No acute chest finding.   Electronically Signed   By: Daryll Brod M.D.   On:  07/11/2013 23:24    EKG Interpretation    Date/Time:  Thursday July 11 2013 22:27:58 EST Ventricular Rate:  67 PR Interval:  155 QRS Duration: 70 QT Interval:  394 QTC Calculation: 416 R Axis:   56 Text Interpretation:  Sinus rhythm Atrial premature complex RSR' in V1 or V2, right VCD or RVH Borderline ST elevation, inferior leads Baseline wander in lead(s) V3 No significant change since April 03 1999 Confirmed by WARD  DO, KRISTEN ([id number]) on 07/11/2013 10:36:44 PM            MDM   1. Anxiety   2. Chest discomfort   3. PVC (premature ventricular contraction)    The patient's exam is fairly normal. His EKG is unremarkable other than some T waves which appear slightly peaked. Compared to his prior EKG from March of 2012 there is no significant changes. No ST elevation, no T-wave inversions, vital signs are remarkable for some hypertension but on exam the patient is not tachycardic, not febrile and has oxygen saturation of 100%. His abdominal exam is totally benign and he has no peripheral edema.  Labwork shows a normal troponin, normal blood counts, normal renal function and electrolytes.  Chest x-ray shows no acute pulmonary edema, cardiomegaly or focal infiltrates.  Discussed with on-call cardiology, he agrees that the patient came to home, already taking metoprolol. 2 normal troponins, EKG is unremarkable, patient appears stable for discharge. He has been informed of these results and is amenable to followup. We'll try Xanax for occasional anxiety.   Meds given in ED:  Medications  ALPRAZolam (XANAX) tablet 0.5  mg (not administered)    New Prescriptions   ALPRAZOLAM (XANAX) 0.5 MG TABLET    Take 1 tablet (0.5 mg total) by mouth at bedtime as needed for anxiety.      Johnna Acosta, MD 07/12/13 [phone]

## 2013-07-11 NOTE — ED Notes (Signed)
According to EMS, patient called advised he was having chest pain but it is due to anxiety.  The patient denies LOC, SOB, N/V, weakness or any other symptoms.  Pain resolved prior to EMS arrival. Patient is chest pain free.  EMS inserted an #18 gauge to the left AC.  Patient was transported here to be evaluated.

## 2013-07-12 LAB — POCT I-STAT TROPONIN I: TROPONIN I, POC: 0 ng/mL (ref 0.00–0.08)

## 2013-07-12 MED ORDER — ALPRAZOLAM 0.25 MG PO TABS
0.5000 mg | ORAL_TABLET | Freq: Once | ORAL | Status: AC
  Administered 2013-07-12: 0.5 mg via ORAL
  Filled 2013-07-12: qty 2

## 2013-07-12 MED ORDER — ALPRAZOLAM 0.5 MG PO TABS: 0.5000 mg | ORAL_TABLET | Freq: Every evening | ORAL | Status: DC | PRN

## 2013-07-12 NOTE — Discharge Instructions (Signed)
Your testing was normal - I discussed your care with the on call cardiologist who agrees that you can follow up this week.  Continue to take your Metoprolol for the occasional extra beats.  Please call your doctor for a followup appointment within 24-48 hours. When you talk to your doctor please let them know that you were seen in the emergency department and have them acquire all of your records so that they can discuss the findings with you and formulate a treatment plan to fully care for your new and ongoing problems.

## 2013-07-12 NOTE — ED Notes (Signed)
C/o butterfly

## 2013-09-02 ENCOUNTER — Other Ambulatory Visit: Payer: Self-pay | Admitting: Internal Medicine

## 2013-09-02 DIAGNOSIS — R1011 Right upper quadrant pain: Secondary | ICD-10-CM

## 2013-09-05 ENCOUNTER — Encounter: Payer: Self-pay | Admitting: Gastroenterology

## 2013-09-05 DIAGNOSIS — K921 Melena: Secondary | ICD-10-CM | POA: Insufficient documentation

## 2013-09-05 DIAGNOSIS — I8390 Asymptomatic varicose veins of unspecified lower extremity: Secondary | ICD-10-CM | POA: Insufficient documentation

## 2013-09-06 ENCOUNTER — Ambulatory Visit
Admission: RE | Admit: 2013-09-06 | Discharge: 2013-09-06 | Disposition: A | Discharge status: Home / Self Care | Payer: Medicare HMO | Source: Ambulatory Visit | Attending: Internal Medicine | Admitting: Internal Medicine

## 2013-09-06 DIAGNOSIS — R1011 Right upper quadrant pain: Secondary | ICD-10-CM

## 2013-09-09 ENCOUNTER — Other Ambulatory Visit: Payer: Self-pay | Admitting: *Deleted

## 2013-09-09 ENCOUNTER — Encounter (INDEPENDENT_AMBULATORY_CARE_PROVIDER_SITE_OTHER): Payer: Self-pay | Admitting: General Surgery

## 2013-09-09 DIAGNOSIS — I83893 Varicose veins of bilateral lower extremities with other complications: Secondary | ICD-10-CM

## 2013-09-10 ENCOUNTER — Encounter (INDEPENDENT_AMBULATORY_CARE_PROVIDER_SITE_OTHER): Payer: Self-pay | Admitting: Surgery

## 2013-09-10 ENCOUNTER — Ambulatory Visit (INDEPENDENT_AMBULATORY_CARE_PROVIDER_SITE_OTHER): Payer: Medicare HMO | Admitting: Surgery

## 2013-09-10 VITALS — BP 102/62 | HR 80 | Temp 98.5°F | Resp 16 | Ht 67.0 in | Wt 150.0 lb

## 2013-09-10 DIAGNOSIS — K645 Perianal venous thrombosis: Secondary | ICD-10-CM | POA: Insufficient documentation

## 2013-09-10 MED ORDER — HYDROCODONE-ACETAMINOPHEN 5-325 MG PO TABS: 1.0000 | ORAL_TABLET | Freq: Four times a day (QID) | ORAL | Status: DC | PRN

## 2013-09-10 NOTE — Patient Instructions (Signed)
Hemorrhoids Hemorrhoids are swollen veins around the rectum or anus. There are two types of hemorrhoids:   Internal hemorrhoids. These occur in the veins just inside the rectum. They may poke through to the outside and become irritated and painful.  External hemorrhoids. These occur in the veins outside the anus and can be felt as a painful swelling or hard lump near the anus. CAUSES  Pregnancy.   Obesity.   Constipation or diarrhea.   Straining to have a bowel movement.   Sitting for long periods on the toilet.  Heavy lifting or other activity that caused you to strain.  Anal intercourse. SYMPTOMS   Pain.   Anal itching or irritation.   Rectal bleeding.   Fecal leakage.   Anal swelling.   One or more lumps around the anus.  DIAGNOSIS  Your caregiver may be able to diagnose hemorrhoids by visual examination. Other examinations or tests that may be performed include:   Examination of the rectal area with a gloved hand (digital rectal exam).   Examination of anal canal using a small tube (scope).   A blood test if you have lost a significant amount of blood.  A test to look inside the colon (sigmoidoscopy or colonoscopy). TREATMENT Most hemorrhoids can be treated at home. However, if symptoms do not seem to be getting better or if you have a lot of rectal bleeding, your caregiver may perform a procedure to help make the hemorrhoids get smaller or remove them completely. Possible treatments include:   Placing a rubber band at the base of the hemorrhoid to cut off the circulation (rubber band ligation).   Injecting a chemical to shrink the hemorrhoid (sclerotherapy).   Using a tool to burn the hemorrhoid (infrared light therapy).   Surgically removing the hemorrhoid (hemorrhoidectomy).   Stapling the hemorrhoid to block blood flow to the tissue (hemorrhoid stapling).  HOME CARE INSTRUCTIONS   Eat foods with fiber, such as whole grains, beans,  nuts, fruits, and vegetables. Ask your doctor about taking products with added fiber in them (fibersupplements).  Increase fluid intake. Drink enough water and fluids to keep your urine clear or pale yellow.   Exercise regularly.   Go to the bathroom when you have the urge to have a bowel movement. Do not wait.   Avoid straining to have bowel movements.   Keep the anal area dry and clean. Use wet toilet paper or moist towelettes after a bowel movement.   Medicated creams and suppositories may be used or applied as directed.   Only take over-the-counter or prescription medicines as directed by your caregiver.   Take warm sitz baths for 15 20 minutes, 3 4 times a day to ease pain and discomfort.   Place ice packs on the hemorrhoids if they are tender and swollen. Using ice packs between sitz baths may be helpful.   Put ice in a plastic bag.   Place a towel between your skin and the bag.   Leave the ice on for 15 20 minutes, 3 4 times a day.   Do not use a donut-shaped pillow or sit on the toilet for long periods. This increases blood pooling and pain.  SEEK MEDICAL CARE IF:  You have increasing pain and swelling that is not controlled by treatment or medicine.  You have uncontrolled bleeding.  You have difficulty or you are unable to have a bowel movement.  You have pain or inflammation outside the area of the hemorrhoids. MAKE SURE YOU:    Understand these instructions.  Will watch your condition.  Will get help right away if you are not doing well or get worse. Document Released: 05/27/2000 Document Revised: 05/16/2012 Document Reviewed: 04/03/2012 [employer] Patient Information 2014 [employer].  

## 2013-09-10 NOTE — Progress Notes (Signed)
Subjective:     Patient ID: JADAKISS BARISH, male   DOB: 12/27/1950, 63 y.o.   MRN: [id number]  HPI Patient presents with rectal bleeding and pain. This happened about 3 days ago. He feels some fullness in his anal canal. He passes blood with his bowel movements. The area is tender around his anal canal. There is no bleeding in between bowel movements except for some oozing on the well-padded which is mild in amount.  Review of Systems  Cardiovascular: Negative.   Gastrointestinal: Positive for anal bleeding. Negative for abdominal pain.  Musculoskeletal: Negative.        Objective:   Physical Exam  Constitutional: He appears well-developed and well-nourished.  HENT:  hoh  Genitourinary:     Skin: Skin is warm and dry.  Psychiatric: He has a normal mood and affect. His behavior is normal. Judgment and thought content normal.       Assessment:     2 column thrombosed external hemorrhoid with necrosis    Plan:     Recommend excision in office with local anesthesia. The patient questions were answered. Procedure discussed. He wished to proceed. Using Betadine to prep the anal canal was prepped circumferentially. 1% lidocaine was used to inject a both columns. Scalpel was used to excise both which had thrombus in each. Hemostasis achieved with pressure. Patient tolerated procedure well. Return in 3 weeks for recheck. Post procedure instructions given as well as prescription for noro 5 /325 for pain.  Warm tub soaks and stool softners as needed.

## 2013-09-20 ENCOUNTER — Ambulatory Visit (INDEPENDENT_AMBULATORY_CARE_PROVIDER_SITE_OTHER): Payer: BC Managed Care – PPO | Admitting: General Surgery

## 2013-10-01 ENCOUNTER — Encounter (INDEPENDENT_AMBULATORY_CARE_PROVIDER_SITE_OTHER): Payer: Self-pay | Admitting: Surgery

## 2013-10-01 ENCOUNTER — Ambulatory Visit (INDEPENDENT_AMBULATORY_CARE_PROVIDER_SITE_OTHER): Payer: Medicare HMO | Admitting: Surgery

## 2013-10-01 VITALS — BP 124/78 | HR 72 | Temp 97.0°F | Resp 14 | Ht 67.0 in | Wt 149.0 lb

## 2013-10-01 DIAGNOSIS — R1031 Right lower quadrant pain: Secondary | ICD-10-CM

## 2013-10-01 DIAGNOSIS — K645 Perianal venous thrombosis: Secondary | ICD-10-CM

## 2013-10-01 NOTE — Progress Notes (Signed)
Subjective:     Patient ID: [name], male   DOB: 07/30/1950, 63 y.o.   MRN: [id number]  HPI Patient returns for followup after excision of thrombosed external hemorrhoid disease 3 weeks ago. He now has right groin pain which started about 2 weeks ago. The pain is sharp and stabbing and intermittent. It is made worse when he strains. Patient has history of previous open right inguinal hernia repair with mesh by Dr. Deon Pilling in 1990. No nausea or vomiting. Denies any rectal bleeding or pain currently.  Review of Systems  Gastrointestinal: Positive for abdominal pain. Negative for blood in stool and rectal pain.  Neurological: Negative.   Hematological: Negative.        Objective:   Physical Exam  Constitutional: He is oriented to person, place, and time. He appears well-developed and well-nourished.  Abdominal: Hernia confirmed negative in the right inguinal area and confirmed negative in the left inguinal area.  Genitourinary:        Musculoskeletal: Normal range of motion.  Lymphadenopathy:       Right: No inguinal adenopathy present.       Left: No inguinal adenopathy present.  Neurological: He is alert and oriented to person, place, and time.  Skin: Skin is warm and dry.       Assessment:     Thrombosed external hemorrhoid status post excision  New right lower quadrant and right groin pain    Plan:     He is doing well after his hemorrhoid surgery. I recommended a CT scan to further evaluate his right groin and right lower quadrant pain to exclude recurrent. Further care after  test done.

## 2013-10-01 NOTE — Patient Instructions (Signed)
WILL ORDER ct TO EVALUATE PAIN.  FURTHER RECOMMENDATIONS AFTER.

## 2013-10-04 ENCOUNTER — Ambulatory Visit
Admission: RE | Admit: 2013-10-04 | Discharge: 2013-10-04 | Disposition: A | Discharge status: Home / Self Care | Payer: Medicare HMO | Source: Ambulatory Visit | Attending: Surgery | Admitting: Surgery

## 2013-10-04 DIAGNOSIS — R1031 Right lower quadrant pain: Secondary | ICD-10-CM

## 2013-10-04 MED ORDER — IOHEXOL 300 MG/ML  SOLN
100.0000 mL | Freq: Once | INTRAMUSCULAR | Status: AC | PRN
  Administered 2013-10-04: 100 mL via INTRAVENOUS

## 2013-10-07 ENCOUNTER — Telehealth (INDEPENDENT_AMBULATORY_CARE_PROVIDER_SITE_OTHER): Payer: Self-pay

## 2013-10-07 NOTE — Telephone Encounter (Signed)
Message copied by Carlene Coria on Mon Oct 07, 2013  1:53 PM ------      Message from: Erroll Luna A      Created: Mon Oct 07, 2013  1:33 PM       Looks normal. No recurrent hernia.  Would consider ligamentous strain.  Ibuprofen ice and rest. ------

## 2013-10-07 NOTE — Telephone Encounter (Signed)
Called pt with below resutls

## 2013-10-23 ENCOUNTER — Ambulatory Visit: Payer: BC Managed Care – PPO | Admitting: Gastroenterology

## 2013-10-24 ENCOUNTER — Encounter: Payer: Self-pay | Admitting: Vascular Surgery

## 2013-10-25 ENCOUNTER — Encounter: Payer: Medicare HMO | Admitting: Vascular Surgery

## 2013-10-25 ENCOUNTER — Encounter (HOSPITAL_COMMUNITY): Payer: Medicare HMO

## 2013-10-28 ENCOUNTER — Emergency Department (HOSPITAL_COMMUNITY)
Admission: EM | Admit: 2013-10-28 | Discharge: 2013-10-28 | Disposition: A | Discharge status: Home / Self Care | Payer: Medicare HMO | Attending: Emergency Medicine | Admitting: Emergency Medicine

## 2013-10-28 ENCOUNTER — Emergency Department (HOSPITAL_COMMUNITY): Payer: Medicare HMO

## 2013-10-28 ENCOUNTER — Encounter (HOSPITAL_COMMUNITY): Payer: Self-pay | Admitting: Emergency Medicine

## 2013-10-28 DIAGNOSIS — Z7982 Long term (current) use of aspirin: Secondary | ICD-10-CM | POA: Insufficient documentation

## 2013-10-28 DIAGNOSIS — Z87442 Personal history of urinary calculi: Secondary | ICD-10-CM | POA: Insufficient documentation

## 2013-10-28 DIAGNOSIS — Z862 Personal history of diseases of the blood and blood-forming organs and certain disorders involving the immune mechanism: Secondary | ICD-10-CM | POA: Insufficient documentation

## 2013-10-28 DIAGNOSIS — Z8619 Personal history of other infectious and parasitic diseases: Secondary | ICD-10-CM | POA: Insufficient documentation

## 2013-10-28 DIAGNOSIS — I10 Essential (primary) hypertension: Secondary | ICD-10-CM | POA: Insufficient documentation

## 2013-10-28 DIAGNOSIS — R011 Cardiac murmur, unspecified: Secondary | ICD-10-CM | POA: Insufficient documentation

## 2013-10-28 DIAGNOSIS — R209 Unspecified disturbances of skin sensation: Secondary | ICD-10-CM | POA: Insufficient documentation

## 2013-10-28 DIAGNOSIS — Z79899 Other long term (current) drug therapy: Secondary | ICD-10-CM | POA: Insufficient documentation

## 2013-10-28 DIAGNOSIS — M129 Arthropathy, unspecified: Secondary | ICD-10-CM | POA: Insufficient documentation

## 2013-10-28 DIAGNOSIS — I5022 Chronic systolic (congestive) heart failure: Secondary | ICD-10-CM | POA: Insufficient documentation

## 2013-10-28 DIAGNOSIS — Z87891 Personal history of nicotine dependence: Secondary | ICD-10-CM | POA: Insufficient documentation

## 2013-10-28 DIAGNOSIS — Z8719 Personal history of other diseases of the digestive system: Secondary | ICD-10-CM | POA: Insufficient documentation

## 2013-10-28 DIAGNOSIS — H905 Unspecified sensorineural hearing loss: Secondary | ICD-10-CM | POA: Insufficient documentation

## 2013-10-28 DIAGNOSIS — H538 Other visual disturbances: Secondary | ICD-10-CM | POA: Insufficient documentation

## 2013-10-28 DIAGNOSIS — Z8639 Personal history of other endocrine, nutritional and metabolic disease: Secondary | ICD-10-CM | POA: Insufficient documentation

## 2013-10-28 DIAGNOSIS — H539 Unspecified visual disturbance: Secondary | ICD-10-CM

## 2013-10-28 DIAGNOSIS — Z9889 Other specified postprocedural states: Secondary | ICD-10-CM | POA: Insufficient documentation

## 2013-10-28 LAB — BASIC METABOLIC PANEL
BUN: 19 mg/dL (ref 6–23)
CHLORIDE: 102 meq/L (ref 96–112)
CO2: 25 mEq/L (ref 19–32)
Calcium: 9.9 mg/dL (ref 8.4–10.5)
Creatinine, Ser: 1.05 mg/dL (ref 0.50–1.35)
GFR, EST AFRICAN AMERICAN: 85 mL/min — AB (ref >90)
GFR, EST NON AFRICAN AMERICAN: 74 mL/min — AB (ref >90)
Glucose, Bld: 93 mg/dL (ref 70–99)
POTASSIUM: 4.1 meq/L (ref 3.7–5.3)
SODIUM: 141 meq/L (ref 137–147)

## 2013-10-28 LAB — CBC
HCT: 42.9 % (ref 39.0–52.0)
Hemoglobin: 14.6 g/dL (ref 13.0–17.0)
MCH: 30 pg (ref 26.0–34.0)
MCHC: 34 g/dL (ref 30.0–36.0)
MCV: 88.1 fL (ref 78.0–100.0)
PLATELETS: 231 10*3/uL (ref 150–400)
RBC: 4.87 MIL/uL (ref 4.22–5.81)
RDW: 13.2 % (ref 11.5–15.5)
WBC: 7.7 10*3/uL (ref 4.0–10.5)

## 2013-10-28 LAB — CBG MONITORING, ED
GLUCOSE-CAPILLARY: 130 mg/dL — AB (ref 70–99)
Glucose-Capillary: 65 mg/dL — ABNORMAL LOW (ref 70–99)

## 2013-10-28 LAB — I-STAT TROPONIN, ED: Troponin i, poc: 0.01 ng/mL (ref 0.00–0.08)

## 2013-10-28 MED ORDER — DEXTROSE 50 % IV SOLN
25.0000 mL | Freq: Once | INTRAVENOUS | Status: AC
  Administered 2013-10-28: 25 mL via INTRAVENOUS
  Filled 2013-10-28: qty 50

## 2013-10-28 NOTE — ED Notes (Addendum)
Pt reports at [place] he has some vision changes to L eye with dizziness and R arm tingling. Pt vision has since resolved but pt still has tingling to R arm. No weakness. Pt with hx tia in past. Pt a&ox4, speech is clear. MD in triage to evaluate. Pt also with increased bp.

## 2013-10-28 NOTE — ED Provider Notes (Signed)
CSN: [id number]     Arrival date & time 10/28/13  2039 History   First MD Initiated Contact with Patient 10/28/13 2047     Chief Complaint  Patient presents with  . Stroke Symptoms     (Consider location/radiation/quality/duration/timing/severity/associated sxs/prior Treatment) HPI Comments: Patient was outside and he felt hot. He felt his heart pumping and had 5-6 minutes of L eye double vision. No decreased vision. Headache began once that resolved and patient began to have R arm tingling. Hx of similar vision symptoms prior, stated hx of TIA. Vision symptoms abated. Still having R arm tingling, L sided headache.  Patient is a 63 y.o. male presenting with neurologic complaint. The history is provided by the patient.  Neurologic Problem This is a new problem. Episode onset: 2.5 hours ago. The problem occurs constantly. The problem has not changed since onset.Pertinent negatives include no chest pain, no abdominal pain and no shortness of breath. Nothing aggravates the symptoms. Nothing relieves the symptoms.    Past Medical History  Diagnosis Date  . MVP (mitral valve prolapse) WITH SEVERE MITRAL REGURG  . CHF (congestive heart failure) CHRONIC SYSTOLIC WITH ACUTE EXACERBATIONS  . Palpitations WITH PREMATURE VENTRICULAR COTRACTIONS AND PREMATURE ATRIAL CONTRACTIONS  . Rheumatic fever REPORTED HISTORY  . Nephrolithiasis   . Varicose veins   . GERD (gastroesophageal reflux disease)   . Hyperlipemia   . Sensorineural hearing loss   . Weakness of right upper extremity RELATED TO RUPTURED BICEPS TENDON  . Arthritis   . Heart murmur   . Hypertension    Past Surgical History  Procedure Laterality Date  . Multiple surgical procedures in his use for    . The patient has also had    . Inguinal hernia repair Right     Dr. Deon Pilling  . Tonsillectomy    . Hemorrhoid surgery    . Mitral valve repair  08/19/2010    Dr. Ricard Dillon.  complex valvuloplasty with 58mm ring annuloplasty via right mini  thoracotomy (Dr. Roxy Manns)  . Rotator cuff repair    . Hernia repair    . Ureter ectopic resection Left   . Hydrocele excision / repair Left    Family History  Problem Relation Age of Onset  . Heart attack Mother   . Angina Mother   . Hypertension Mother   . Dementia Mother   . Seizures Father   . Hypertension Sister   . Hypertension Brother    History  Substance Use Topics  . Smoking status: Former Smoker    Types: Cigarettes    Quit date: 08/12/1994  . Smokeless tobacco: Never Used  . Alcohol Use: 0.6 oz/week    1 Cans of beer per week     Comment: Monthly.    Review of Systems  Constitutional: Negative for fever.  Eyes: Positive for visual disturbance.  Respiratory: Negative for cough and shortness of breath.   Cardiovascular: Negative for chest pain and leg swelling.  Gastrointestinal: Negative for nausea, vomiting and abdominal pain.  All other systems reviewed and are negative.     Allergies  Atenolol and Nadolol  Home Medications   Prior to Admission medications   Medication Sig Start Date End Date Taking? Authorizing Provider  aspirin 81 MG tablet Take 81 mg by mouth daily.      Historical Provider, MD  levothyroxine (SYNTHROID, LEVOTHROID) 125 MCG tablet Take 125 mcg by mouth daily before breakfast.    Historical Provider, MD  lisinopril (PRINIVIL,ZESTRIL) 2.5 MG tablet Take 10  mg by mouth daily.     Historical Provider, MD  metoprolol (TOPROL-XL) 100 MG 24 hr tablet Take 100 mg by mouth daily.      Historical Provider, MD   BP 212/103  Pulse 60  Temp(Src) 97.9 F (36.6 C) (Oral)  Resp 20  SpO2 100% Physical Exam  Nursing note and vitals reviewed. Constitutional: He is oriented to person, place, and time. He appears well-developed and well-nourished. No distress.  HENT:  Head: Normocephalic and atraumatic.  Mouth/Throat: No oropharyngeal exudate.  Eyes: EOM are normal. Pupils are equal, round, and reactive to light.  Neck: Normal range of motion.  Neck supple.  Cardiovascular: Normal rate and regular rhythm.  Exam reveals no friction rub.   No murmur heard. Pulmonary/Chest: Effort normal and breath sounds normal. No respiratory distress. He has no wheezes. He has no rales.  Abdominal: Soft. He exhibits no distension. There is no tenderness. There is no rebound.  Musculoskeletal: Normal range of motion. He exhibits no edema.  Neurological: He is alert and oriented to person, place, and time. A sensory deficit (R arm/hand tingling) is present. No cranial nerve deficit. He exhibits normal muscle tone. GCS eye subscore is 4. GCS verbal subscore is 5. GCS motor subscore is 6.  Skin: He is not diaphoretic.    ED Course  Procedures (including critical care time) Labs Review Labs Reviewed  CBG MONITORING, ED - Abnormal; Notable for the following:    Glucose-Capillary 65 (*)    All other components within normal limits    Imaging Review Ct Head Wo Contrast  10/28/2013   CLINICAL DATA:  STROKE SYMPTOMS  EXAM: CT HEAD WITHOUT CONTRAST  TECHNIQUE: Contiguous axial images were obtained from the base of the skull through the vertex without intravenous contrast.  COMPARISON:  MR HEAD W/O CM dated 01/07/2011; CT HEAD W/O CM dated 01/07/2011  FINDINGS: No acute intracranial abnormality. Specifically, no hemorrhage, hydrocephalus, mass lesion, acute infarction, or significant intracranial injury. No acute calvarial abnormality. Age-appropriate global atrophy. Mild there is low-attenuation in the subcortical, deep, and periventricular white matter regions consistent with mild small vessel white matter ischaemia. The visualized paranasal sinuses and mastoid air cells are patent.  IMPRESSION: No acute intracranial abnormality. . Chronic an involutional changes.   Electronically Signed   By: Margaree Mackintosh M.D.   On: 10/28/2013 22:00     EKG Interpretation   Date/Time:  Monday Oct 28 2013 20:58:22 EDT Ventricular Rate:  72 PR Interval:  144 QRS  Duration: 90 QT Interval:  418 QTC Calculation: 457 R Axis:   40 Text Interpretation:  Sinus rhythm Probable left atrial enlargement RSR'  in V1 or V2, probably normal variant Borderline ST elevation, anterior  leads Similar to prior Confirmed by Mingo Amber  MD, [place] ([id number]) on 10/28/2013  10:47:51 PM      MDM   Final diagnoses:  Visual disturbance  Hypertension    66M presents with L eye double vision sensation/blurry vision, then headache after that. Double vision lasted several minutes. Headache began and has persisted. He had R arm weakness start with his headache. No vision issues now, normal strength and cranial nerves on exam at 845. Symptoms started at 615. I do not feel patient is a Code Stroke candidate. I will speak with Dr. Nicole Kindred with Neuro and discuss if this patient meets Code Stroke criteria. BP is elevated at 212/103. Labs and CT ok. I spoke again with patient, BP improving. He states he has had multiple episodes over  the past several months for his blood pressure was elevated for a week for one day. He will occasionally take actual syncopal for this. He is unsure sugar. I instructed him to log blood pressures. We spoke about patient's symptoms again. He stated he was having some abdominal cramping and gas pains and also had the right arm tingling. That lasted a very short amount of time. Tingling is resolved upon his arrival. I misunderstood him initially and on the right arm tingling was continuing in the ER. Patient also reports history of similar eye issue before. He saw an ophthalmologist who told him he is concerned with possible stroke of his high. His description of this sounded like a central retinal artery occlusion. He states this is the exact same sensation he had previously. He is currently symptom-free and doing well. I do not think patient needs a TIA workup. The right arm tingling was very briefly and not consistent with prolonged ischemic stroke-type symptoms.   Will have him f/u Ophtho for his eye symptoms and with his PCP for his HTN. I have reviewed all labs and imaging and considered them in my medical decision making.   Osvaldo Shipper, MD 10/28/13 [phone]

## 2013-10-28 NOTE — Discharge Instructions (Signed)
Visual Disturbances  You have had a disturbance in your vision. This may be caused by various conditions, such as:   Migraines. Migraine headaches are often preceded by a disturbance in vision. Blind spots or light flashes are followed by a headache. This type of visual disturbance is temporary. It does not damage the eye.   Glaucoma. This is caused by increased pressure in the eye. Symptoms include haziness, blurred vision, or seeing rainbow colored circles when looking at bright lights. Partial or complete visual loss can occur. You may or may not experience eye pain. Visual loss may be gradual or sudden and is irreversible. Glaucoma is the leading cause of blindness.   Retina problems. Vision will be reduced if the retina becomes detached or if there is a circulation problem as with diabetes, high blood pressure, or a mini-stroke. Symptoms include seeing "floaters," flashes of light, or shadows, as if a curtain has fallen over your eye.   Optic nerve problems. The main nerve in your eye can be damaged by redness, soreness, and swelling (inflammation), poor circulation, drugs, and toxins.  It is very important to have a complete exam done by a specialist to determine the exact cause of your eye problem. The specialist may recommend medicines or surgery, depending on the cause of the problem. This can help prevent further loss of vision or reduce the risk of having a stroke. Contact the caregiver to whom you have been referred and arrange for follow-up care right away.  SEEK IMMEDIATE MEDICAL CARE IF:    Your vision gets worse.   You develop severe headaches.   You have any weakness or numbness in the face, arms, or legs.   You have any trouble speaking or walking.  Document Released: 07/07/2004 Document Revised: 08/22/2011 Document Reviewed: 10/28/2009  [employer] Patient Information 2014 [employer].

## 2013-10-28 NOTE — ED Notes (Signed)
Pt ambulated out of ED with slow, steady gait no acute distress noted.

## 2014-02-08 ENCOUNTER — Encounter: Payer: Self-pay | Admitting: Cardiology

## 2014-09-09 ENCOUNTER — Encounter: Payer: Self-pay | Admitting: Family Medicine

## 2014-09-09 ENCOUNTER — Ambulatory Visit (INDEPENDENT_AMBULATORY_CARE_PROVIDER_SITE_OTHER): Payer: Medicare HMO | Admitting: Family Medicine

## 2014-09-09 VITALS — BP 112/72 | HR 65 | Ht 67.0 in | Wt 160.0 lb

## 2014-09-09 DIAGNOSIS — M545 Low back pain, unspecified: Secondary | ICD-10-CM | POA: Insufficient documentation

## 2014-09-09 DIAGNOSIS — M999 Biomechanical lesion, unspecified: Secondary | ICD-10-CM

## 2014-09-09 DIAGNOSIS — M9902 Segmental and somatic dysfunction of thoracic region: Secondary | ICD-10-CM

## 2014-09-09 DIAGNOSIS — G8929 Other chronic pain: Secondary | ICD-10-CM | POA: Insufficient documentation

## 2014-09-09 DIAGNOSIS — M9903 Segmental and somatic dysfunction of lumbar region: Secondary | ICD-10-CM

## 2014-09-09 DIAGNOSIS — M79672 Pain in left foot: Secondary | ICD-10-CM | POA: Insufficient documentation

## 2014-09-09 DIAGNOSIS — M9901 Segmental and somatic dysfunction of cervical region: Secondary | ICD-10-CM | POA: Diagnosis not present

## 2014-09-09 NOTE — Progress Notes (Signed)
Pre visit review using our clinic review tool, if applicable. No additional management support is needed unless otherwise documented below in the visit note. 

## 2014-09-09 NOTE — Assessment & Plan Note (Signed)
Patient's left foot pain seems to be more secondary to compression secondary to tight shoes. We discussed avoiding this as well as icing and patient will try topical anti-inflammatories. Patient has any worsening symptoms we will consider further imaging such as ultrasound and x-ray. I'm not concern for any type of fracture at this point. Patient will make this shoe choices and I think this will help.

## 2014-09-09 NOTE — Assessment & Plan Note (Signed)
Patient overall given some home exercises. We discussed icing protocol and topical anti-inflammatories. Patient also can try some over-the-counter medications. Patient did respond fairly well to osteopathic manipulation today. We discussed what activities potentially to avoid. Patient will come back again in 3 weeks for further evaluation and treatment. If continuing have pain we need to consider imaging.

## 2014-09-09 NOTE — Progress Notes (Signed)
[name] [address] [place], [place] [address] Phone: [phone] Subjective:    CC: Back pain  [id number] [name] is a 64 y.o. male coming in with complaint of back pain. Patient has had this pain for quite some time. Patient did have epidurals in 2009 but unfortunately was not giving significant improvement. Patient then started to do more of a natural approach trying to watch his weight as well as diet. Patient did notice some improvement but is starting have some worsening symptoms. Patient denies any radiation that seems to be going down his legs at this point but has some mild groin pain on the right side. Patient has been doing some exercises but does not remember any true injury. Patient denies any fevers or chills or any abnormal weight loss. Denies any nighttime awakening secondary to the pain. Rates the severity of pain a 6 out of 10.  In addition of this patient has had some left foot pain. On the outside of the foot. Mild in nature but seems to be worse when he wears tight shoes. Seems to be better when he does not wear shoes. Some redness on the left side of his foot he states. Denies any true injury.     Past medical history, social, surgical and family history all reviewed in electronic medical record.   Review of Systems: No headache, visual changes, nausea, vomiting, diarrhea, constipation, dizziness, abdominal pain, skin rash, fevers, chills, night sweats, weight loss, swollen lymph nodes, body aches, joint swelling, muscle aches, chest pain, shortness of breath, mood changes.   Objective Blood pressure 112/72, weight 160 lb (72.576 kg).  General: No apparent distress alert and oriented x3 mood and affect normal, dressed appropriately.  HEENT: Pupils equal, extraocular movements intact  Respiratory: Patient's speak in full sentences and does not appear short of breath  Cardiovascular: No lower extremity edema, non tender, no  erythema  Skin: Warm dry intact with no signs of infection or rash on extremities or on axial skeleton.  Abdomen: Soft nontender  Neuro: Cranial nerves II through XII are intact, neurovascularly intact in all extremities with 2+ DTRs and 2+ pulses.  Lymph: No lymphadenopathy of posterior or anterior cervical chain or axillae bilaterally.  Gait normal with good balance and coordination.  MSK:  Non tender with full range of motion and good stability and symmetric strength and tone of shoulders, elbows, wrist, hip, knee and ankles bilaterally. Mild limitation of the hips bilaterally with internal and external range of motion. Back Exam:  Inspection: Levoscoliosis of the lumbar spine and retro-scoliosis of the thoracic spine Motion: Flexion 45 deg, Extension 45 deg, Side Bending to 45 deg bilaterally,  Rotation to 45 deg bilaterally  SLR laying: Negative  XSLR laying: Negative  Palpable tenderness: Positive right. Sacral iliac joint,  FABER: Positive right Sensory change: Gross sensation intact to all lumbar and sacral dermatomes.  Reflexes: 2+ at both patellar tendons, 2+ at achilles tendons, Babinski's downgoing.  Strength at foot  Plantar-flexion: 5/5 Dorsi-flexion: 5/5 Eversion: 5/5 Inversion: 5/5  Leg strength  Quad: 5/5 Hamstring: 4/5 Hip flexor: 4/5 Hip abductors: 4/5  Gait unremarkable. Osteopathic findings Cervical C2 flexed rotated and side bent right Thoracic T3 extended rotated and side bent right T7 extended rotated and side bent left Lumbar L2 flexed rotated and side bent right Sacrum Left on left Foot exam shows the patient does have an inverse incomplete collection of the transverse arch bilaterally. Mild tenderness to palpation  over the lateral aspect of the foot.   Impression and Recommendations:     This case required medical decision making of moderate complexity.

## 2014-09-09 NOTE — Assessment & Plan Note (Signed)
Decision today to treat with OMT was based on Physical Exam  After verbal consent patient was treated with ME, FPR, ST techniques in cervical, thoracic, lumbar and sacral areas  Patient tolerated the procedure well with improvement in symptoms  Patient given exercises, stretches and lifestyle modifications  See medications in patient instructions if given  Patient will follow up in3 weeks

## 2014-09-09 NOTE — Patient Instructions (Signed)
Good to see you Ice when hurting especially to the foot.  Vitamin D 2000 IU daily Fish oil 3 grams daily Consider the turmeric 500mg  daily We will consider xrays at a later date.  For the hip consider  Sacroiliac Joint Mobilization and Rehab 1. Work on pretzel stretching, shoulder back and leg draped in front. 3-5 sets, 30 sec.. 2. hip abductor rotations. standing, hip flexion and rotation outward then inward. 3 sets, 15 reps. when can do comfortably, add ankle weights starting at 2 pounds.  3. cross over stretching - shoulder back to ground, same side leg crossover. 3-5 sets for 30 min..  4. rolling up and back knees to chest and rocking. 5. sacral tilt - 5 sets, hold for 5-10 seconds Exercises on wall.  Heel and butt touching.  Raise leg 6 inches and hold 2 seconds.  Down slow for count of 4 seconds.  1 set of 30 reps daily on both sides.  Exercises in handout 3 times a week.  For foot wear wide shoes (2e) with rigid bottom Good shoes with rigid bottom.  Jalene Mullet, [place]rell or New balance greater then 700

## 2014-09-30 ENCOUNTER — Encounter: Payer: Self-pay | Admitting: Family Medicine

## 2014-09-30 ENCOUNTER — Ambulatory Visit (INDEPENDENT_AMBULATORY_CARE_PROVIDER_SITE_OTHER): Payer: Medicare HMO | Admitting: Family Medicine

## 2014-09-30 VITALS — BP 108/72 | HR 60 | Ht 67.0 in | Wt 160.0 lb

## 2014-09-30 DIAGNOSIS — G8929 Other chronic pain: Secondary | ICD-10-CM

## 2014-09-30 DIAGNOSIS — M9901 Segmental and somatic dysfunction of cervical region: Secondary | ICD-10-CM | POA: Diagnosis not present

## 2014-09-30 DIAGNOSIS — M545 Low back pain: Secondary | ICD-10-CM | POA: Diagnosis not present

## 2014-09-30 DIAGNOSIS — M999 Biomechanical lesion, unspecified: Secondary | ICD-10-CM

## 2014-09-30 DIAGNOSIS — M9903 Segmental and somatic dysfunction of lumbar region: Secondary | ICD-10-CM

## 2014-09-30 DIAGNOSIS — M9902 Segmental and somatic dysfunction of thoracic region: Secondary | ICD-10-CM

## 2014-09-30 NOTE — Assessment & Plan Note (Signed)
Decision today to treat with OMT was based on Physical Exam  After verbal consent patient was treated with ME, FPR, ST techniques in cervical, thoracic, lumbar and sacral areas  Patient tolerated the procedure well with improvement in symptoms  Patient given exercises, stretches and lifestyle modifications  See medications in patient instructions if given  Patient will follow up in 6 weeks

## 2014-09-30 NOTE — Progress Notes (Signed)
Pre visit review using our clinic review tool, if applicable. No additional management support is needed unless otherwise documented below in the visit note. 

## 2014-09-30 NOTE — Assessment & Plan Note (Signed)
Patient is responding very quickly to the conservative therapy. Patient has numbness in the past as well. We discussed continuing all the exercises fairly regularly as well as the icing protocol the natural supplementations. Patient will see me again in 6 weeks for further evaluation and treatment. Continue manipulation as long as he continues to be beneficial.

## 2014-09-30 NOTE — Progress Notes (Signed)
  [name] [address] [place], [place] [address] Phone: [phone] Subjective:    CC: Back pain  JKD:[id number] [name] is a 64 y.o. male coming in with complaint of back pain. Patient has had this pain for quite some time. Patient did have epidurals in 2009 but unfortunately was not giving significant improvement. Patient then started to do more of a natural approach trying to watch his weight as well as diet. Patient was seen and patient was given different exercises and has noticed that he has had increasing range of motion as well as decreasing pain. Patient has been doing more activity and continues to improve. Patient is very happy with the results.  Patient was seen previously and had more of a left foot pain. There is a concern for patient having more of a stress reaction. Patient states his long as he wears rigid soles shoes he seems to do relatively well. Denies that it stops him from any activities.     Past medical history, social, surgical and family history all reviewed in electronic medical record.   Review of Systems: No headache, visual changes, nausea, vomiting, diarrhea, constipation, dizziness, abdominal pain, skin rash, fevers, chills, night sweats, weight loss, swollen lymph nodes, body aches, joint swelling, muscle aches, chest pain, shortness of breath, mood changes.   Objective Blood pressure 108/72, pulse 60, height 5\' 7"  (1.702 m), weight 160 lb (72.576 kg), SpO2 99 %.  General: No apparent distress alert and oriented x3 mood and affect normal, dressed appropriately.  HEENT: Pupils equal, extraocular movements intact  Respiratory: Patient's speak in full sentences and does not appear short of breath  Cardiovascular: No lower extremity edema, non tender, no erythema  Skin: Warm dry intact with no signs of infection or rash on extremities or on axial skeleton.  Abdomen: Soft nontender  Neuro: Cranial nerves II through XII  are intact, neurovascularly intact in all extremities with 2+ DTRs and 2+ pulses.  Lymph: No lymphadenopathy of posterior or anterior cervical chain or axillae bilaterally.  Gait normal with good balance and coordination.  MSK:  Non tender with full range of motion and good stability and symmetric strength and tone of shoulders, elbows, wrist, hip, knee and ankles bilaterally. Mild limitation of the hips bilaterally with internal and external range of motion. Back Exam:  Inspection: Levoscoliosis of the lumbar spine and retro-scoliosis of the thoracic spine Motion: Flexion 45 deg, Extension 45 deg, Side Bending to 45 deg bilaterally,  Rotation to 45 deg bilaterally  SLR laying: Negative  XSLR laying: Negative  Palpable tenderness: Less tender than previous exam FABER: Positive right Sensory change: Gross sensation intact to all lumbar and sacral dermatomes.  Reflexes: 2+ at both patellar tendons, 2+ at achilles tendons, Babinski's downgoing.  Strength at foot  Plantar-flexion: 5/5 Dorsi-flexion: 5/5 Eversion: 5/5 Inversion: 5/5  Leg strength  Quad: 5/5 Hamstring: 4/5 Hip flexor: 4/5 Hip abductors: 4/5  Gait unremarkable. Osteopathic findings Cervical C2 flexed rotated and side bent right Thoracic T3 extended rotated and side bent right T7 extended rotated and side bent left Lumbar L2 flexed rotated and side bent right Sacrum Left on left Foot exam shows the patient does have an reversal of the transverse arch bilaterally. Mild tenderness to palpation over the lateral aspect of the foot.   Impression and Recommendations:     This case required medical decision making of moderate complexity.

## 2014-09-30 NOTE — Patient Instructions (Signed)
You are oind great Foot will take 3 months Continue everything you are doing.  Ice when you need it.  Continue the vitamins See me in 6 weeks.

## 2014-11-03 ENCOUNTER — Encounter: Payer: Self-pay | Admitting: Family Medicine

## 2014-11-04 ENCOUNTER — Encounter: Payer: Self-pay | Admitting: Podiatry

## 2014-11-04 ENCOUNTER — Ambulatory Visit (INDEPENDENT_AMBULATORY_CARE_PROVIDER_SITE_OTHER): Payer: Medicare HMO | Admitting: Podiatry

## 2014-11-04 ENCOUNTER — Ambulatory Visit (INDEPENDENT_AMBULATORY_CARE_PROVIDER_SITE_OTHER): Payer: Medicare HMO

## 2014-11-04 VITALS — BP 134/79 | HR 64 | Resp 16 | Ht 67.0 in | Wt 157.0 lb

## 2014-11-04 DIAGNOSIS — B351 Tinea unguium: Secondary | ICD-10-CM

## 2014-11-04 DIAGNOSIS — M779 Enthesopathy, unspecified: Secondary | ICD-10-CM | POA: Diagnosis not present

## 2014-11-04 DIAGNOSIS — M79672 Pain in left foot: Secondary | ICD-10-CM

## 2014-11-04 DIAGNOSIS — Q828 Other specified congenital malformations of skin: Secondary | ICD-10-CM

## 2014-11-04 MED ORDER — TAVABOROLE 5 % EX SOLN: CUTANEOUS | Status: DC

## 2014-11-04 NOTE — Progress Notes (Signed)
   Subjective:    Patient ID: [name], male    DOB: 12-May-1951, 64 y.o.   MRN: [id number]  HPI Comments: "I have pain on the side"  Patient c/o aching 5th MPJ left for about 1 month. The area is red, swelling and callused. Walking makes worse. Tried using a pumice stone-hasn't helped.  Foot Pain Associated symptoms include arthralgias and myalgias.      Review of Systems  HENT: Positive for hearing loss.   Cardiovascular: Positive for palpitations.  Musculoskeletal: Positive for myalgias, back pain and arthralgias.  All other systems reviewed and are negative.      Objective:   Physical Exam: I have reviewed his past medical history medications allergies surgery social history and review of systems. Pulses are strongly palpable bilateral. Neurologic sensorium is intact. Deep tendon reflexes are intact. Muscle strength +5 over 5 dorsiflexion and plantar flexors and inverters and everters all of his musculature is intact. Orthopedic evaluation demonstrates all joints distal to the ankle range of motion without crepitation. He has pain on palpation fifth metatarsophalangeal joint of the left foot with an overlying area or keratoma. Radiograph does demonstrate what appears to be a bursitis beneath the fifth metatarsophalangeal joint left.        Assessment & Plan:  Mild tailor's bunion deformity with capsulitis and bursitis fifth metatarsophalangeal joint left foot. Porokeratosis fifth left.  Plan: Discussed etiology pathology conservative versus surgical therapies. Injected the fifth metatarsophalangeal joint area with dexamethasone on the left static and sharply debrided all reactive hyperkeratotic tissue. Follow-up with him as needed.

## 2014-11-11 ENCOUNTER — Ambulatory Visit (INDEPENDENT_AMBULATORY_CARE_PROVIDER_SITE_OTHER): Payer: Medicare HMO | Admitting: Family Medicine

## 2014-11-11 ENCOUNTER — Encounter: Payer: Self-pay | Admitting: Family Medicine

## 2014-11-11 VITALS — BP 108/70 | HR 59 | Ht 67.0 in | Wt 159.0 lb

## 2014-11-11 DIAGNOSIS — G8929 Other chronic pain: Secondary | ICD-10-CM

## 2014-11-11 DIAGNOSIS — M9901 Segmental and somatic dysfunction of cervical region: Secondary | ICD-10-CM | POA: Diagnosis not present

## 2014-11-11 DIAGNOSIS — M9903 Segmental and somatic dysfunction of lumbar region: Secondary | ICD-10-CM | POA: Diagnosis not present

## 2014-11-11 DIAGNOSIS — M9902 Segmental and somatic dysfunction of thoracic region: Secondary | ICD-10-CM

## 2014-11-11 DIAGNOSIS — M545 Low back pain, unspecified: Secondary | ICD-10-CM

## 2014-11-11 DIAGNOSIS — M999 Biomechanical lesion, unspecified: Secondary | ICD-10-CM

## 2014-11-11 NOTE — Patient Instructions (Addendum)
Sorry for the delay.  OCnitnue what you are doing See me when yo uneed me.

## 2014-11-11 NOTE — Assessment & Plan Note (Signed)
She is doing significantly better at this time. We discussed icing regimen and home exercises. We discussed postural changes that could be beneficial. Patient will do more core strengthening exercises and will follow-up with me in 8-10 week intervals.

## 2014-11-11 NOTE — Progress Notes (Signed)
Pre visit review using our clinic review tool, if applicable. No additional management support is needed unless otherwise documented below in the visit note. 

## 2014-11-11 NOTE — Progress Notes (Signed)
  [name] [address] [place], [place] [address] Phone: [phone] Subjective:    CC: Back pain  [id number] [name] is a 64 y.o. male coming in with complaint of back pain. Patient has had this pain for quite some time. Patient did have epidurals in 2009 but unfortunately was not giving significant improvement. Patient then started to do more of a natural approach trying to watch his weight as well as diet. Patient was seen and patient was given different exercises and has noticed that he has had increasing range of motion as well as decreasing pain. Patient remains very active. Patient states that the pain in his back is more of a dull 4. Nothing that is stopping him from activities. Nothing that is causing any discomfort with his nighttime regimen as well. Patient is very happy with the results.      Past medical history, social, surgical and family history all reviewed in electronic medical record.   Review of Systems: No headache, visual changes, nausea, vomiting, diarrhea, constipation, dizziness, abdominal pain, skin rash, fevers, chills, night sweats, weight loss, swollen lymph nodes, body aches, joint swelling, muscle aches, chest pain, shortness of breath, mood changes.   Objective Blood pressure 108/70, pulse 59, height 5\' 7"  (1.702 m), weight 159 lb (72.122 kg), SpO2 98 %.  General: No apparent distress alert and oriented x3 mood and affect normal, dressed appropriately.  HEENT: Pupils equal, extraocular movements intact  Respiratory: Patient's speak in full sentences and does not appear short of breath  Cardiovascular: No lower extremity edema, non tender, no erythema  Skin: Warm dry intact with no signs of infection or rash on extremities or on axial skeleton.  Abdomen: Soft nontender  Neuro: Cranial nerves II through XII are intact, neurovascularly intact in all extremities with 2+ DTRs and 2+ pulses.  Lymph: No lymphadenopathy of  posterior or anterior cervical chain or axillae bilaterally.  Gait normal with good balance and coordination.  MSK:  Non tender with full range of motion and good stability and symmetric strength and tone of shoulders, elbows, wrist, hip, knee and ankles bilaterally. Mild limitation of the hips bilaterally with internal and external range of motion. Back Exam:  Inspection: Levoscoliosis of the lumbar spine and retro-scoliosis of the thoracic spine Motion: Flexion 45 deg, Extension 45 deg, Side Bending to 45 deg bilaterally,  Rotation to 45 deg bilaterally  SLR laying: Negative  XSLR laying: Negative  Palpable tenderness: Nontender FABER: Positive right side improved Sensory change: Gross sensation intact to all lumbar and sacral dermatomes.  Reflexes: 2+ at both patellar tendons, 2+ at achilles tendons, Babinski's downgoing.  Strength at foot  Plantar-flexion: 5/5 Dorsi-flexion: 5/5 Eversion: 5/5 Inversion: 5/5  Leg strength  Quad: 5/5 Hamstring: 4/5 Hip flexor: 4/5 Hip abductors: 4/5  Gait unremarkable. Osteopathic findings Cervical C2 flexed rotated and side bent right Thoracic T3 extended rotated and side bent right T7 extended rotated and side bent left Lumbar L2 flexed rotated and side bent right Sacrum Left on left    Impression and Recommendations:     This case required medical decision making of moderate complexity.

## 2014-11-11 NOTE — Assessment & Plan Note (Signed)
Decision today to treat with OMT was based on Physical Exam  After verbal consent patient was treated with ME, FPR, ST techniques in cervical, thoracic, lumbar and sacral areas  Patient tolerated the procedure well with improvement in symptoms  Patient given exercises, stretches and lifestyle modifications  See medications in patient instructions if given  Patient will follow up in 8-10 weeks

## 2015-01-01 ENCOUNTER — Encounter: Payer: Self-pay | Admitting: Podiatry

## 2015-01-01 ENCOUNTER — Ambulatory Visit (INDEPENDENT_AMBULATORY_CARE_PROVIDER_SITE_OTHER): Payer: Medicare HMO | Admitting: Podiatry

## 2015-01-01 VITALS — BP 121/73 | HR 62 | Resp 15

## 2015-01-01 DIAGNOSIS — M779 Enthesopathy, unspecified: Secondary | ICD-10-CM | POA: Diagnosis not present

## 2015-01-01 DIAGNOSIS — Q828 Other specified congenital malformations of skin: Secondary | ICD-10-CM | POA: Diagnosis not present

## 2015-01-01 NOTE — Progress Notes (Signed)
He presents today with a chief complaint of a painful lesion and painful fifth metatarsophalangeal joint of the left foot. He states that I tried wider shoe gears you have suggested however I still have to wear tighter shoes which irritate the area occasionally. Currently he states that he has pain beneath the fifth metatarsal head of the left foot. Denies changes in his past medical history medications allergies or surgery.  Objective: Vital signs are stable alert and oriented 3. He has boggy fluctuant area beneath the fifth metatarsal phalangeal joint of the left foot and a mild tailor's bunion deformity. He also has a poor keratotic lesion with a area of reactive hyperkeratotic rim secondary to pressure. This is more than likely associated with tailor's bunion deformity and shoe gear.  Assessment: Capsulitis bursitis fifth metatarsophalangeal joint left. Porokeratotic lesion associated with pressure and probable shoe gear.  Plan: Discussed appropriate shoe gear stretching exercises ice therapy shoe gear modifications. I injected the fifth metatarsophalangeal joint today with dexamethasone and local anesthesia I also debrided the porokeratotic lesion manually and then places salicylic acid pack over the lesion to be left on for 2-3 days without getting wet. I will follow-up with him in a few weeks for reevaluation. I did discuss the possible need for surgical intervention which he declined immediately.

## 2015-01-06 ENCOUNTER — Ambulatory Visit: Payer: Medicare HMO | Admitting: Podiatry

## 2015-02-12 ENCOUNTER — Ambulatory Visit: Payer: Medicare HMO | Admitting: Podiatry

## 2015-02-22 ENCOUNTER — Emergency Department (HOSPITAL_COMMUNITY)
Admission: EM | Admit: 2015-02-22 | Discharge: 2015-02-23 | Disposition: A | Discharge status: Home / Self Care | Payer: Medicare HMO | Source: Home / Self Care | Attending: Emergency Medicine | Admitting: Emergency Medicine

## 2015-02-22 ENCOUNTER — Encounter (HOSPITAL_COMMUNITY): Payer: Self-pay | Admitting: Family Medicine

## 2015-02-22 ENCOUNTER — Emergency Department (HOSPITAL_COMMUNITY): Payer: Medicare HMO

## 2015-02-22 ENCOUNTER — Emergency Department (HOSPITAL_COMMUNITY)
Admission: EM | Admit: 2015-02-22 | Discharge: 2015-02-22 | Disposition: A | Discharge status: Home / Self Care | Payer: Medicare HMO | Attending: Emergency Medicine | Admitting: Emergency Medicine

## 2015-02-22 ENCOUNTER — Encounter (HOSPITAL_COMMUNITY): Payer: Self-pay

## 2015-02-22 ENCOUNTER — Other Ambulatory Visit: Payer: Self-pay

## 2015-02-22 DIAGNOSIS — Z79899 Other long term (current) drug therapy: Secondary | ICD-10-CM | POA: Diagnosis not present

## 2015-02-22 DIAGNOSIS — Z792 Long term (current) use of antibiotics: Secondary | ICD-10-CM | POA: Diagnosis not present

## 2015-02-22 DIAGNOSIS — R51 Headache: Secondary | ICD-10-CM | POA: Diagnosis present

## 2015-02-22 DIAGNOSIS — R079 Chest pain, unspecified: Secondary | ICD-10-CM | POA: Diagnosis not present

## 2015-02-22 DIAGNOSIS — Z7982 Long term (current) use of aspirin: Secondary | ICD-10-CM | POA: Insufficient documentation

## 2015-02-22 DIAGNOSIS — I10 Essential (primary) hypertension: Secondary | ICD-10-CM | POA: Diagnosis not present

## 2015-02-22 DIAGNOSIS — R011 Cardiac murmur, unspecified: Secondary | ICD-10-CM | POA: Diagnosis not present

## 2015-02-22 DIAGNOSIS — I509 Heart failure, unspecified: Secondary | ICD-10-CM | POA: Insufficient documentation

## 2015-02-22 DIAGNOSIS — E785 Hyperlipidemia, unspecified: Secondary | ICD-10-CM | POA: Diagnosis not present

## 2015-02-22 DIAGNOSIS — K219 Gastro-esophageal reflux disease without esophagitis: Secondary | ICD-10-CM | POA: Diagnosis not present

## 2015-02-22 DIAGNOSIS — Z87448 Personal history of other diseases of urinary system: Secondary | ICD-10-CM | POA: Insufficient documentation

## 2015-02-22 DIAGNOSIS — M1990 Unspecified osteoarthritis, unspecified site: Secondary | ICD-10-CM | POA: Insufficient documentation

## 2015-02-22 DIAGNOSIS — Z87891 Personal history of nicotine dependence: Secondary | ICD-10-CM | POA: Diagnosis not present

## 2015-02-22 LAB — CBC
HCT: 43.2 % (ref 39.0–52.0)
Hemoglobin: 14.7 g/dL (ref 13.0–17.0)
MCH: 30.7 pg (ref 26.0–34.0)
MCHC: 34 g/dL (ref 30.0–36.0)
MCV: 90.2 fL (ref 78.0–100.0)
PLATELETS: 196 10*3/uL (ref 150–400)
RBC: 4.79 MIL/uL (ref 4.22–5.81)
RDW: 13.2 % (ref 11.5–15.5)
WBC: 9.5 10*3/uL (ref 4.0–10.5)

## 2015-02-22 LAB — BASIC METABOLIC PANEL
Anion gap: 8 (ref 5–15)
BUN: 24 mg/dL — AB (ref 6–20)
CALCIUM: 9.3 mg/dL (ref 8.9–10.3)
CHLORIDE: 101 mmol/L (ref 101–111)
CO2: 28 mmol/L (ref 22–32)
CREATININE: 1.04 mg/dL (ref 0.61–1.24)
GFR calc Af Amer: >60 mL/min (ref >60)
GFR calc non Af Amer: >60 mL/min (ref >60)
GLUCOSE: 108 mg/dL — AB (ref 65–99)
Potassium: 4.4 mmol/L (ref 3.5–5.1)
Sodium: 137 mmol/L (ref 135–145)

## 2015-02-22 LAB — I-STAT TROPONIN, ED: Troponin i, poc: 0.01 ng/mL (ref 0.00–0.08)

## 2015-02-22 MED ORDER — HYDRALAZINE HCL 20 MG/ML IJ SOLN
10.0000 mg | Freq: Once | INTRAMUSCULAR | Status: DC
  Filled 2015-02-22: qty 1

## 2015-02-22 NOTE — ED Notes (Cosign Needed)
Pt started a new generic Losartan 02-19-15.  BP has been elevated since yesterday and feels he is getting frequent PVC's.  Pt also c/o posterior headache and chest tightness.

## 2015-02-22 NOTE — ED Notes (Signed)
The pt has been taking generic bp meds and huis bp has been very high today.  He has a severe headache that he usually has when his bp is high

## 2015-02-22 NOTE — ED Provider Notes (Signed)
CSN: [id number]     Arrival date & time 02/22/15  2010 History   First MD Initiated Contact with Patient 02/22/15 2153     Chief Complaint  Patient presents with  . Headache  . Chest Pain     (Consider location/radiation/quality/duration/timing/severity/associated sxs/prior Treatment) Patient is a 64 y.o. male presenting with hypertension.  Hypertension This is a chronic problem. The current episode started yesterday. The problem occurs constantly. The problem has been gradually improving. Associated symptoms include chest pain and headaches. Pertinent negatives include no abdominal pain, arthralgias, chills, congestion, fever, myalgias, nausea, sore throat, visual change or vomiting. Nothing aggravates the symptoms. Treatments tried: losartan. The treatment provided no relief.    Past Medical History  Diagnosis Date  . MVP (mitral valve prolapse) WITH SEVERE MITRAL REGURG  . CHF (congestive heart failure) CHRONIC SYSTOLIC WITH ACUTE EXACERBATIONS  . Palpitations WITH PREMATURE VENTRICULAR COTRACTIONS AND PREMATURE ATRIAL CONTRACTIONS  . Rheumatic fever REPORTED HISTORY  . Nephrolithiasis   . Varicose veins   . GERD (gastroesophageal reflux disease)   . Hyperlipemia   . Sensorineural hearing loss   . Weakness of right upper extremity RELATED TO RUPTURED BICEPS TENDON  . Arthritis   . Heart murmur   . Hypertension    Past Surgical History  Procedure Laterality Date  . Multiple surgical procedures in his use for    . The patient has also had    . Inguinal hernia repair Right     Dr. Deon Pilling  . Tonsillectomy    . Hemorrhoid surgery    . Mitral valve repair  08/19/2010    Dr. Ricard Dillon.  complex valvuloplasty with 64mm ring annuloplasty via right mini thoracotomy (Dr. Roxy Manns)  . Rotator cuff repair    . Hernia repair    . Ureter ectopic resection Left   . Hydrocele excision / repair Left    Family History  Problem Relation Age of Onset  . Heart attack Mother   . Angina Mother    . Hypertension Mother   . Dementia Mother   . Seizures Father   . Hypertension Sister   . Hypertension Brother    Social History  Substance Use Topics  . Smoking status: Former Smoker    Types: Cigarettes    Quit date: 08/12/1994  . Smokeless tobacco: Never Used  . Alcohol Use: No    Review of Systems  Constitutional: Negative for fever and chills.  HENT: Negative for congestion and sore throat.   Eyes: Positive for pain. Negative for visual disturbance.  Respiratory: Negative for shortness of breath and wheezing.   Cardiovascular: Positive for chest pain.  Gastrointestinal: Negative for nausea, vomiting, abdominal pain, diarrhea and constipation.  Genitourinary: Negative for dysuria and difficulty urinating.  Musculoskeletal: Negative for myalgias and arthralgias.  Skin: Negative for wound.  Neurological: Positive for headaches. Negative for syncope.  Psychiatric/Behavioral: Negative for behavioral problems.  All other systems reviewed and are negative.     Allergies  Atenolol and Nadolol  Home Medications   Prior to Admission medications   Medication Sig Start Date End Date Taking? Authorizing Provider  amoxicillin (AMOXIL) 500 MG capsule Take 500 mg by mouth. Only takes when has dental work done 12/27/14   Historical Provider, MD  aspirin 81 MG tablet Take 81 mg by mouth daily.      Historical Provider, MD  levothyroxine (SYNTHROID, LEVOTHROID) 100 MCG tablet  08/06/14   Historical Provider, MD  losartan (COZAAR) 100 MG tablet  07/24/14   Historical  Provider, MD  metoprolol (TOPROL-XL) 100 MG 24 hr tablet Take 100 mg by mouth daily.      Historical Provider, MD  simvastatin (ZOCOR) 20 MG tablet Take 20 mg by mouth daily.    Historical Provider, MD  Tavaborole (KERYDIN) 5 % SOLN Apply one application to affected nails once daily. 11/04/14   Max T Hyatt, DPM   BP 187/90 mmHg  Pulse 66  Temp(Src) 97.7 F (36.5 C) (Oral)  Resp 18  Ht 5\' 7"  (1.702 m)  Wt 153 lb (69.4  kg)  BMI 23.96 kg/m2  SpO2 100% Physical Exam  Constitutional: He is oriented to person, place, and time. He appears well-developed and well-nourished.  HENT:  Head: Normocephalic and atraumatic.  Eyes: EOM are normal.  Neck: Normal range of motion.  Cardiovascular: Normal rate, regular rhythm and normal heart sounds.   No murmur heard. Pulmonary/Chest: Effort normal and breath sounds normal. No respiratory distress.  Abdominal: Soft. There is no tenderness.  Musculoskeletal: He exhibits no edema.  Neurological: He is alert and oriented to person, place, and time. He has normal strength. No cranial nerve deficit or sensory deficit. Coordination normal. GCS eye subscore is 4. GCS verbal subscore is 5. GCS motor subscore is 6.  Skin: No rash noted. He is not diaphoretic.    ED Course  Procedures (including critical care time) Labs Review Labs Reviewed  BASIC METABOLIC PANEL - Abnormal; Notable for the following:    Glucose, Bld 108 (*)    BUN 24 (*)    All other components within normal limits  CBC  I-STAT TROPOININ, ED    Imaging Review Dg Chest 2 View  02/22/2015   CLINICAL DATA:  64 year old male with chest pain  EXAM: CHEST  2 VIEW  COMPARISON:  None.  FINDINGS: Two views of the chest do not demonstrate any focal consolidation, pleural fluid, or pneumothorax. Stable cardiac silhouette. A mechanical cardiac valve noted. The osseous structures appear unremarkable.  IMPRESSION: No active cardiopulmonary disease.   Electronically Signed   By: Anner Crete M.D.   On: 02/22/2015 22:08   I have personally reviewed and evaluated these images and lab results as part of my medical decision-making.   EKG Interpretation   Date/Time:  Sunday February 22 2015 20:51:26 EDT Ventricular Rate:  62 PR Interval:  150 QRS Duration: 86 QT Interval:  398 QTC Calculation: 403 R Axis:   13 Text Interpretation:  Normal sinus rhythm Early repolarization Normal ECG  Confirmed by BEATON  MD,  ROBERT ([id number]) on 02/22/2015 11:18:37 PM      MDM   Final diagnoses:  Essential hypertension     Patient is a 64 year old male with a history of hypertension, congestive heart failure, mitral valve prolapse that presents with headache, chest tightness, hypertension. Patient recently got a new prescription for losartan 2 days ago that was a different manufacturer than the one he previously used. Patient feels that this is not controlling his blood pressure at all. Patient's had this happen with several other medications in the past when going from brand to generic. On arrival to the ED the patient is hypertensive and denies any chest pain at this time however still has a headache. Patient's neuro exam is unremarkable. Patient's heart and lung sounds normal. Patient's EKG is normal sinus rhythm without evidence of ischemic change. Patient does have early repolarization on his EKG. We'll do screening labs to assess for signs of hypertensive emergency.  On reassessment patient's blood pressure is 138/81  and headache feels better. We advised patient to increase his losartan until he is able to see his PCP. Return precautions given and patient and wife voice understanding.  Renne Musca, MD 02/22/15 2344  Leonard Schwartz, MD 03/01/15 [phone]

## 2015-02-22 NOTE — ED Notes (Signed)
Patient states he states LOSARTAN 100mg  daily. On Friday, he got his new refill from [facility], he states the change in the generic has caused his blood pressure to increase with a headache. Denies blurry vision or dizziness. Pt appears anxious about his blood pressure.

## 2015-04-24 DIAGNOSIS — E785 Hyperlipidemia, unspecified: Secondary | ICD-10-CM | POA: Diagnosis not present

## 2015-04-24 DIAGNOSIS — Z125 Encounter for screening for malignant neoplasm of prostate: Secondary | ICD-10-CM | POA: Diagnosis not present

## 2015-04-24 DIAGNOSIS — I10 Essential (primary) hypertension: Secondary | ICD-10-CM | POA: Diagnosis not present

## 2015-04-24 DIAGNOSIS — E038 Other specified hypothyroidism: Secondary | ICD-10-CM | POA: Diagnosis not present

## 2015-04-24 DIAGNOSIS — Z0000 Encounter for general adult medical examination without abnormal findings: Secondary | ICD-10-CM | POA: Diagnosis not present

## 2015-05-01 DIAGNOSIS — I8390 Asymptomatic varicose veins of unspecified lower extremity: Secondary | ICD-10-CM | POA: Diagnosis not present

## 2015-05-01 DIAGNOSIS — M545 Low back pain: Secondary | ICD-10-CM | POA: Diagnosis not present

## 2015-05-01 DIAGNOSIS — E784 Other hyperlipidemia: Secondary | ICD-10-CM | POA: Diagnosis not present

## 2015-05-01 DIAGNOSIS — Z0000 Encounter for general adult medical examination without abnormal findings: Secondary | ICD-10-CM | POA: Diagnosis not present

## 2015-05-01 DIAGNOSIS — I480 Paroxysmal atrial fibrillation: Secondary | ICD-10-CM | POA: Diagnosis not present

## 2015-05-01 DIAGNOSIS — Z23 Encounter for immunization: Secondary | ICD-10-CM | POA: Diagnosis not present

## 2015-05-01 DIAGNOSIS — H6091 Unspecified otitis externa, right ear: Secondary | ICD-10-CM | POA: Diagnosis not present

## 2015-05-01 DIAGNOSIS — K8020 Calculus of gallbladder without cholecystitis without obstruction: Secondary | ICD-10-CM | POA: Diagnosis not present

## 2015-05-01 DIAGNOSIS — E038 Other specified hypothyroidism: Secondary | ICD-10-CM | POA: Diagnosis not present

## 2015-05-01 DIAGNOSIS — I10 Essential (primary) hypertension: Secondary | ICD-10-CM | POA: Diagnosis not present

## 2015-05-11 DIAGNOSIS — Z1212 Encounter for screening for malignant neoplasm of rectum: Secondary | ICD-10-CM | POA: Diagnosis not present

## 2015-07-16 DIAGNOSIS — H6121 Impacted cerumen, right ear: Secondary | ICD-10-CM | POA: Diagnosis not present

## 2015-07-16 DIAGNOSIS — H6061 Unspecified chronic otitis externa, right ear: Secondary | ICD-10-CM | POA: Diagnosis not present

## 2015-07-27 DIAGNOSIS — Z954 Presence of other heart-valve replacement: Secondary | ICD-10-CM | POA: Diagnosis not present

## 2015-07-27 DIAGNOSIS — I872 Venous insufficiency (chronic) (peripheral): Secondary | ICD-10-CM | POA: Diagnosis not present

## 2015-07-27 DIAGNOSIS — E785 Hyperlipidemia, unspecified: Secondary | ICD-10-CM | POA: Diagnosis not present

## 2015-07-27 DIAGNOSIS — E039 Hypothyroidism, unspecified: Secondary | ICD-10-CM | POA: Diagnosis not present

## 2015-07-27 DIAGNOSIS — Z8673 Personal history of transient ischemic attack (TIA), and cerebral infarction without residual deficits: Secondary | ICD-10-CM | POA: Diagnosis not present

## 2015-07-27 DIAGNOSIS — I471 Supraventricular tachycardia: Secondary | ICD-10-CM | POA: Diagnosis not present

## 2015-07-27 DIAGNOSIS — I491 Atrial premature depolarization: Secondary | ICD-10-CM | POA: Diagnosis not present

## 2015-07-27 DIAGNOSIS — I493 Ventricular premature depolarization: Secondary | ICD-10-CM | POA: Diagnosis not present

## 2015-07-27 DIAGNOSIS — I480 Paroxysmal atrial fibrillation: Secondary | ICD-10-CM | POA: Diagnosis not present

## 2015-07-27 DIAGNOSIS — I10 Essential (primary) hypertension: Secondary | ICD-10-CM | POA: Diagnosis not present

## 2015-09-24 DIAGNOSIS — H903 Sensorineural hearing loss, bilateral: Secondary | ICD-10-CM | POA: Diagnosis not present

## 2015-09-24 DIAGNOSIS — H608X1 Other otitis externa, right ear: Secondary | ICD-10-CM | POA: Diagnosis not present

## 2015-10-01 DIAGNOSIS — E038 Other specified hypothyroidism: Secondary | ICD-10-CM | POA: Diagnosis not present

## 2015-10-05 DIAGNOSIS — I480 Paroxysmal atrial fibrillation: Secondary | ICD-10-CM | POA: Diagnosis not present

## 2015-10-05 DIAGNOSIS — I10 Essential (primary) hypertension: Secondary | ICD-10-CM | POA: Diagnosis not present

## 2015-10-05 DIAGNOSIS — E038 Other specified hypothyroidism: Secondary | ICD-10-CM | POA: Diagnosis not present

## 2015-10-05 DIAGNOSIS — Z6826 Body mass index (BMI) 26.0-26.9, adult: Secondary | ICD-10-CM | POA: Diagnosis not present

## 2015-10-20 DIAGNOSIS — H903 Sensorineural hearing loss, bilateral: Secondary | ICD-10-CM | POA: Diagnosis not present

## 2015-11-02 DIAGNOSIS — M79604 Pain in right leg: Secondary | ICD-10-CM | POA: Diagnosis not present

## 2015-11-13 DIAGNOSIS — M25561 Pain in right knee: Secondary | ICD-10-CM | POA: Diagnosis not present

## 2015-11-13 DIAGNOSIS — S76311D Strain of muscle, fascia and tendon of the posterior muscle group at thigh level, right thigh, subsequent encounter: Secondary | ICD-10-CM | POA: Diagnosis not present

## 2015-11-13 DIAGNOSIS — M79604 Pain in right leg: Secondary | ICD-10-CM | POA: Diagnosis not present

## 2015-11-13 DIAGNOSIS — R262 Difficulty in walking, not elsewhere classified: Secondary | ICD-10-CM | POA: Diagnosis not present

## 2015-11-17 DIAGNOSIS — R262 Difficulty in walking, not elsewhere classified: Secondary | ICD-10-CM | POA: Diagnosis not present

## 2015-11-17 DIAGNOSIS — S76311D Strain of muscle, fascia and tendon of the posterior muscle group at thigh level, right thigh, subsequent encounter: Secondary | ICD-10-CM | POA: Diagnosis not present

## 2015-11-17 DIAGNOSIS — M25561 Pain in right knee: Secondary | ICD-10-CM | POA: Diagnosis not present

## 2015-11-17 DIAGNOSIS — M79604 Pain in right leg: Secondary | ICD-10-CM | POA: Diagnosis not present

## 2015-11-24 DIAGNOSIS — R262 Difficulty in walking, not elsewhere classified: Secondary | ICD-10-CM | POA: Diagnosis not present

## 2015-11-24 DIAGNOSIS — M79604 Pain in right leg: Secondary | ICD-10-CM | POA: Diagnosis not present

## 2015-11-24 DIAGNOSIS — M25561 Pain in right knee: Secondary | ICD-10-CM | POA: Diagnosis not present

## 2015-11-24 DIAGNOSIS — S76311D Strain of muscle, fascia and tendon of the posterior muscle group at thigh level, right thigh, subsequent encounter: Secondary | ICD-10-CM | POA: Diagnosis not present

## 2015-12-03 DIAGNOSIS — M25561 Pain in right knee: Secondary | ICD-10-CM | POA: Diagnosis not present

## 2015-12-24 DIAGNOSIS — S83206A Unspecified tear of unspecified meniscus, current injury, right knee, initial encounter: Secondary | ICD-10-CM | POA: Diagnosis not present

## 2016-01-14 DIAGNOSIS — S83206D Unspecified tear of unspecified meniscus, current injury, right knee, subsequent encounter: Secondary | ICD-10-CM | POA: Diagnosis not present

## 2016-01-19 NOTE — Progress Notes (Deleted)
[name] [address] [place], [place] [address] Phone: [phone] Subjective:    CC: Back pain  [id number]  [name] is a 65 y.o. male coming in with complaint of back pain. Patient has had this pain for quite some time. Patient did have epidurals in 2009 but unfortunately was not giving significant improvement.  Patient was last seen over a year ago.       Past Medical History:  Diagnosis Date  . Arthritis   . CHF (congestive heart failure) CHRONIC SYSTOLIC WITH ACUTE EXACERBATIONS  . GERD (gastroesophageal reflux disease)   . Heart murmur   . Hyperlipemia   . Hypertension   . MVP (mitral valve prolapse) WITH SEVERE MITRAL REGURG  . Nephrolithiasis   . Palpitations WITH PREMATURE VENTRICULAR COTRACTIONS AND PREMATURE ATRIAL CONTRACTIONS  . Rheumatic fever REPORTED HISTORY  . Sensorineural hearing loss   . Varicose veins   . Weakness of right upper extremity RELATED TO RUPTURED BICEPS TENDON   Past Surgical History:  Procedure Laterality Date  . HEMORRHOID SURGERY    . HERNIA REPAIR    . HYDROCELE EXCISION / REPAIR Left   . INGUINAL HERNIA REPAIR Right    Dr. Deon Pilling  . MITRAL VALVE REPAIR  08/19/2010   Dr. Ricard Dillon.  complex valvuloplasty with 82mm ring annuloplasty via right mini thoracotomy (Dr. Roxy Manns)  . Multiple surgical procedures in his use for    . ROTATOR CUFF REPAIR    . The patient has also had    . TONSILLECTOMY    . URETER ECTOPIC RESECTION Left    Social History  Substance Use Topics  . Smoking status: Former Smoker    Types: Cigarettes    Quit date: 08/12/1994  . Smokeless tobacco: Never Used  . Alcohol use No   Allergies  Allergen Reactions  . Atenolol     Stated doctor told him he was allergic.  . Nadolol    Family History  Problem Relation Age of Onset  . Heart attack Mother   . Angina Mother   . Hypertension Mother   . Dementia Mother   . Seizures Father   . Hypertension Sister   . Hypertension  Brother     Past medical history, social, surgical and family history all reviewed in electronic medical record.   Review of Systems: No headache, visual changes, nausea, vomiting, diarrhea, constipation, dizziness, abdominal pain, skin rash, fevers, chills, night sweats, weight loss, swollen lymph nodes, body aches, joint swelling, muscle aches, chest pain, shortness of breath, mood changes.   Objective  There were no vitals taken for this visit.  General: No apparent distress alert and oriented x3 mood and affect normal, dressed appropriately.  HEENT: Pupils equal, extraocular movements intact  Respiratory: Patient's speak in full sentences and does not appear short of breath  Cardiovascular: No lower extremity edema, non tender, no erythema  Skin: Warm dry intact with no signs of infection or rash on extremities or on axial skeleton.  Abdomen: Soft nontender  Neuro: Cranial nerves II through XII are intact, neurovascularly intact in all extremities with 2+ DTRs and 2+ pulses.  Lymph: No lymphadenopathy of posterior or anterior cervical chain or axillae bilaterally.  Gait normal with good balance and coordination.  MSK:  Non tender with full range of motion and good stability and symmetric strength and tone of shoulders, elbows, wrist, hip, knee and ankles bilaterally. Mild limitation of the hips bilaterally with internal and external range of  motion. Back Exam:  Inspection: Levoscoliosis of the lumbar spine and retro-scoliosis of the thoracic spine Motion: Flexion 45 deg, Extension 45 deg, Side Bending to 45 deg bilaterally,  Rotation to 45 deg bilaterally  SLR laying: Negative  XSLR laying: Negative  Palpable tenderness: Nontender FABER: Positive right side improved Sensory change: Gross sensation intact to all lumbar and sacral dermatomes.  Reflexes: 2+ at both patellar tendons, 2+ at achilles tendons, Babinski's downgoing.  Strength at foot  Plantar-flexion: 5/5 Dorsi-flexion: 5/5  Eversion: 5/5 Inversion: 5/5  Leg strength  Quad: 5/5 Hamstring: 4/5 Hip flexor: 4/5 Hip abductors: 4/5  Gait unremarkable. Osteopathic findings Cervical C2 flexed rotated and side bent right Thoracic T3 extended rotated and side bent right T7 extended rotated and side bent left Lumbar L2 flexed rotated and side bent right Sacrum Left on left    Impression and Recommendations:     This case required medical decision making of moderate complexity.

## 2016-01-20 ENCOUNTER — Ambulatory Visit: Payer: Medicare HMO | Admitting: Family Medicine

## 2016-02-02 DIAGNOSIS — Z6825 Body mass index (BMI) 25.0-25.9, adult: Secondary | ICD-10-CM | POA: Diagnosis not present

## 2016-02-02 DIAGNOSIS — R1031 Right lower quadrant pain: Secondary | ICD-10-CM | POA: Diagnosis not present

## 2016-02-02 DIAGNOSIS — M545 Low back pain: Secondary | ICD-10-CM | POA: Diagnosis not present

## 2016-02-03 DIAGNOSIS — R1031 Right lower quadrant pain: Secondary | ICD-10-CM | POA: Diagnosis not present

## 2016-02-03 DIAGNOSIS — M545 Low back pain: Secondary | ICD-10-CM | POA: Diagnosis not present

## 2016-02-11 DIAGNOSIS — R69 Illness, unspecified: Secondary | ICD-10-CM | POA: Diagnosis not present

## 2016-02-19 DIAGNOSIS — M25561 Pain in right knee: Secondary | ICD-10-CM | POA: Diagnosis not present

## 2016-02-19 DIAGNOSIS — Z6826 Body mass index (BMI) 26.0-26.9, adult: Secondary | ICD-10-CM | POA: Diagnosis not present

## 2016-04-06 DIAGNOSIS — Z0100 Encounter for examination of eyes and vision without abnormal findings: Secondary | ICD-10-CM | POA: Diagnosis not present

## 2016-04-06 DIAGNOSIS — H52223 Regular astigmatism, bilateral: Secondary | ICD-10-CM | POA: Diagnosis not present

## 2016-04-06 DIAGNOSIS — I10 Essential (primary) hypertension: Secondary | ICD-10-CM | POA: Diagnosis not present

## 2016-04-06 DIAGNOSIS — H5213 Myopia, bilateral: Secondary | ICD-10-CM | POA: Diagnosis not present

## 2016-04-06 DIAGNOSIS — H524 Presbyopia: Secondary | ICD-10-CM | POA: Diagnosis not present

## 2016-04-27 DIAGNOSIS — R69 Illness, unspecified: Secondary | ICD-10-CM | POA: Diagnosis not present

## 2016-05-02 DIAGNOSIS — E038 Other specified hypothyroidism: Secondary | ICD-10-CM | POA: Diagnosis not present

## 2016-05-02 DIAGNOSIS — I10 Essential (primary) hypertension: Secondary | ICD-10-CM | POA: Diagnosis not present

## 2016-05-02 DIAGNOSIS — Z125 Encounter for screening for malignant neoplasm of prostate: Secondary | ICD-10-CM | POA: Diagnosis not present

## 2016-05-02 DIAGNOSIS — E784 Other hyperlipidemia: Secondary | ICD-10-CM | POA: Diagnosis not present

## 2016-05-09 DIAGNOSIS — E784 Other hyperlipidemia: Secondary | ICD-10-CM | POA: Diagnosis not present

## 2016-05-09 DIAGNOSIS — M25561 Pain in right knee: Secondary | ICD-10-CM | POA: Diagnosis not present

## 2016-05-09 DIAGNOSIS — Z6825 Body mass index (BMI) 25.0-25.9, adult: Secondary | ICD-10-CM | POA: Diagnosis not present

## 2016-05-09 DIAGNOSIS — Z1389 Encounter for screening for other disorder: Secondary | ICD-10-CM | POA: Diagnosis not present

## 2016-05-09 DIAGNOSIS — I8390 Asymptomatic varicose veins of unspecified lower extremity: Secondary | ICD-10-CM | POA: Diagnosis not present

## 2016-05-09 DIAGNOSIS — I480 Paroxysmal atrial fibrillation: Secondary | ICD-10-CM | POA: Diagnosis not present

## 2016-05-09 DIAGNOSIS — E038 Other specified hypothyroidism: Secondary | ICD-10-CM | POA: Diagnosis not present

## 2016-05-09 DIAGNOSIS — Z0000 Encounter for general adult medical examination without abnormal findings: Secondary | ICD-10-CM | POA: Diagnosis not present

## 2016-05-09 DIAGNOSIS — I10 Essential (primary) hypertension: Secondary | ICD-10-CM | POA: Diagnosis not present

## 2016-05-09 DIAGNOSIS — K8020 Calculus of gallbladder without cholecystitis without obstruction: Secondary | ICD-10-CM | POA: Diagnosis not present

## 2016-05-30 DIAGNOSIS — Z1212 Encounter for screening for malignant neoplasm of rectum: Secondary | ICD-10-CM | POA: Diagnosis not present

## 2016-06-02 ENCOUNTER — Encounter: Payer: Self-pay | Admitting: Internal Medicine

## 2016-07-04 DIAGNOSIS — R69 Illness, unspecified: Secondary | ICD-10-CM | POA: Diagnosis not present

## 2016-08-02 DIAGNOSIS — Z8673 Personal history of transient ischemic attack (TIA), and cerebral infarction without residual deficits: Secondary | ICD-10-CM | POA: Diagnosis not present

## 2016-08-02 DIAGNOSIS — Z954 Presence of other heart-valve replacement: Secondary | ICD-10-CM | POA: Diagnosis not present

## 2016-08-02 DIAGNOSIS — I10 Essential (primary) hypertension: Secondary | ICD-10-CM | POA: Diagnosis not present

## 2016-08-02 DIAGNOSIS — E039 Hypothyroidism, unspecified: Secondary | ICD-10-CM | POA: Diagnosis not present

## 2016-08-02 DIAGNOSIS — I480 Paroxysmal atrial fibrillation: Secondary | ICD-10-CM | POA: Diagnosis not present

## 2016-08-02 DIAGNOSIS — I872 Venous insufficiency (chronic) (peripheral): Secondary | ICD-10-CM | POA: Diagnosis not present

## 2016-08-02 DIAGNOSIS — I493 Ventricular premature depolarization: Secondary | ICD-10-CM | POA: Diagnosis not present

## 2016-08-02 DIAGNOSIS — E785 Hyperlipidemia, unspecified: Secondary | ICD-10-CM | POA: Diagnosis not present

## 2016-08-18 DIAGNOSIS — L905 Scar conditions and fibrosis of skin: Secondary | ICD-10-CM | POA: Diagnosis not present

## 2016-08-18 DIAGNOSIS — L218 Other seborrheic dermatitis: Secondary | ICD-10-CM | POA: Diagnosis not present

## 2016-08-18 DIAGNOSIS — L814 Other melanin hyperpigmentation: Secondary | ICD-10-CM | POA: Diagnosis not present

## 2016-08-18 DIAGNOSIS — B351 Tinea unguium: Secondary | ICD-10-CM | POA: Diagnosis not present

## 2016-08-18 DIAGNOSIS — B078 Other viral warts: Secondary | ICD-10-CM | POA: Diagnosis not present

## 2016-08-18 DIAGNOSIS — L821 Other seborrheic keratosis: Secondary | ICD-10-CM | POA: Diagnosis not present

## 2016-08-18 DIAGNOSIS — D485 Neoplasm of uncertain behavior of skin: Secondary | ICD-10-CM | POA: Diagnosis not present

## 2016-08-18 DIAGNOSIS — L570 Actinic keratosis: Secondary | ICD-10-CM | POA: Diagnosis not present

## 2016-08-18 DIAGNOSIS — D235 Other benign neoplasm of skin of trunk: Secondary | ICD-10-CM | POA: Diagnosis not present

## 2016-09-01 ENCOUNTER — Ambulatory Visit (INDEPENDENT_AMBULATORY_CARE_PROVIDER_SITE_OTHER): Payer: Medicare HMO | Admitting: Podiatrist

## 2016-09-01 ENCOUNTER — Encounter: Payer: Self-pay | Admitting: Podiatrist

## 2016-09-01 DIAGNOSIS — L602 Onychogryphosis: Secondary | ICD-10-CM

## 2016-09-01 DIAGNOSIS — B351 Tinea unguium: Secondary | ICD-10-CM

## 2016-09-01 NOTE — Progress Notes (Signed)
HPI:  Patient presents today for pain on the right 3rd toe lateral side.  He tried trimming the nail and states it is still uncomfortable.    Objective:  Patients chart is reviewed.  Vascular status reveals pedal pulsesintact dp and pt bilateral .  Neurological sensation is intact to [employer] monofilament bilateral.  Patients right 3rd toenail is incurvated and thick on the lateral side.  No redness, swelling, pus or drainage noted.    Assessment:  Onychogryphosis with onychomycosis right 3rd toenail lateral side.  Plan:  Discussed treatment options and alternatives.  The symptomatic lateral side of the 3rd toenail was debrided manually and mechanically.  Discussed a permanent nail procedure should this fail to relieve his pain.  He will call with questions or concerns.  Otherwise back prn   Trudie Buckler, DPM

## 2016-09-01 NOTE — Patient Instructions (Signed)

## 2016-09-08 DIAGNOSIS — R69 Illness, unspecified: Secondary | ICD-10-CM | POA: Diagnosis not present

## 2016-09-15 DIAGNOSIS — B078 Other viral warts: Secondary | ICD-10-CM | POA: Diagnosis not present

## 2016-09-15 DIAGNOSIS — E038 Other specified hypothyroidism: Secondary | ICD-10-CM | POA: Diagnosis not present

## 2016-09-15 DIAGNOSIS — L814 Other melanin hyperpigmentation: Secondary | ICD-10-CM | POA: Diagnosis not present

## 2016-09-15 DIAGNOSIS — L570 Actinic keratosis: Secondary | ICD-10-CM | POA: Diagnosis not present

## 2016-09-15 DIAGNOSIS — L821 Other seborrheic keratosis: Secondary | ICD-10-CM | POA: Diagnosis not present

## 2016-10-07 DIAGNOSIS — L570 Actinic keratosis: Secondary | ICD-10-CM | POA: Diagnosis not present

## 2016-11-14 DIAGNOSIS — Z6826 Body mass index (BMI) 26.0-26.9, adult: Secondary | ICD-10-CM | POA: Diagnosis not present

## 2016-11-14 DIAGNOSIS — I10 Essential (primary) hypertension: Secondary | ICD-10-CM | POA: Diagnosis not present

## 2016-11-14 DIAGNOSIS — R1011 Right upper quadrant pain: Secondary | ICD-10-CM | POA: Diagnosis not present

## 2016-11-14 DIAGNOSIS — Z1389 Encounter for screening for other disorder: Secondary | ICD-10-CM | POA: Diagnosis not present

## 2016-11-16 DIAGNOSIS — K8020 Calculus of gallbladder without cholecystitis without obstruction: Secondary | ICD-10-CM | POA: Diagnosis not present

## 2016-11-18 DIAGNOSIS — I10 Essential (primary) hypertension: Secondary | ICD-10-CM | POA: Diagnosis not present

## 2016-11-18 DIAGNOSIS — I480 Paroxysmal atrial fibrillation: Secondary | ICD-10-CM | POA: Diagnosis not present

## 2016-11-18 DIAGNOSIS — R10813 Right lower quadrant abdominal tenderness: Secondary | ICD-10-CM | POA: Diagnosis not present

## 2016-11-18 DIAGNOSIS — Z6826 Body mass index (BMI) 26.0-26.9, adult: Secondary | ICD-10-CM | POA: Diagnosis not present

## 2016-11-18 DIAGNOSIS — M545 Low back pain: Secondary | ICD-10-CM | POA: Diagnosis not present

## 2016-11-21 ENCOUNTER — Encounter: Payer: Self-pay | Admitting: Gastroenterology

## 2016-11-21 DIAGNOSIS — K8020 Calculus of gallbladder without cholecystitis without obstruction: Secondary | ICD-10-CM | POA: Diagnosis not present

## 2016-11-21 DIAGNOSIS — R1031 Right lower quadrant pain: Secondary | ICD-10-CM | POA: Diagnosis not present

## 2016-11-21 DIAGNOSIS — N1330 Unspecified hydronephrosis: Secondary | ICD-10-CM | POA: Diagnosis not present

## 2016-11-22 DIAGNOSIS — L814 Other melanin hyperpigmentation: Secondary | ICD-10-CM | POA: Diagnosis not present

## 2016-11-22 DIAGNOSIS — L570 Actinic keratosis: Secondary | ICD-10-CM | POA: Diagnosis not present

## 2016-12-01 ENCOUNTER — Encounter: Payer: Self-pay | Admitting: Family Medicine

## 2016-12-01 ENCOUNTER — Ambulatory Visit (INDEPENDENT_AMBULATORY_CARE_PROVIDER_SITE_OTHER): Payer: Medicare HMO | Admitting: Family Medicine

## 2016-12-01 VITALS — BP 120/76 | HR 64 | Ht 67.0 in | Wt 165.0 lb

## 2016-12-01 DIAGNOSIS — G8929 Other chronic pain: Secondary | ICD-10-CM

## 2016-12-01 DIAGNOSIS — M999 Biomechanical lesion, unspecified: Secondary | ICD-10-CM

## 2016-12-01 DIAGNOSIS — M545 Low back pain: Secondary | ICD-10-CM

## 2016-12-01 NOTE — Patient Instructions (Signed)
Good to see you.  Ice 20 minutes 2 times daily. Usually after activity and before bed. Exercises 3 times a week.  pennsaid pinkie amount topically 2 times daily as needed.  For the knee consider brace and injections.  Stay active.  Thigh compression sleeve with activity  Prilosec 2 times a day for 2 weeks and see how it goes.  See me again in 4 weeks.

## 2016-12-01 NOTE — Assessment & Plan Note (Signed)
Patient does have low back pain. Known to have some arthritic changes and does have some degenerative scoliosis that likely contributing to the pain. Patient did have some mild muscle tightness on the right side. Attempted osteopathic manipulation. Discussed over-the-counter medications, we discussed icing regimen and home exercises. Discussed objective recently do a which was to avoid. Worsening symptoms come back sooner otherwise come back again in 4 weeks.

## 2016-12-01 NOTE — Progress Notes (Signed)
[name] [address], [place] [address] Phone: [phone] Subjective:      CC: Low back pain  TOI:[id number]  [name] is a 66 y.o. male coming in with complaint of low back pain. Patient is having review sleeping. Last time seen greater than 2 years ago. Patient is having some more bone right-sided scapular pain. Patient states that this happened approximately 3 months ago and has been intermittent. Does not notice any true triggers. States it has not been feeling quite right toe. Denies any fever, chills, any abnormal weight loss. Seems to be worse after certain activities. Can be painful at night. Denies any association with food but had not consider this previously.     Past Medical History:  Diagnosis Date  . Arthritis   . CHF (congestive heart failure) (HCC) CHRONIC SYSTOLIC WITH ACUTE EXACERBATIONS  . GERD (gastroesophageal reflux disease)   . Heart murmur   . Hyperlipemia   . Hypertension   . MVP (mitral valve prolapse) WITH SEVERE MITRAL REGURG  . Nephrolithiasis   . Palpitations WITH PREMATURE VENTRICULAR COTRACTIONS AND PREMATURE ATRIAL CONTRACTIONS  . Rheumatic fever REPORTED HISTORY  . Sensorineural hearing loss   . Varicose veins   . Weakness of right upper extremity RELATED TO RUPTURED BICEPS TENDON   Past Surgical History:  Procedure Laterality Date  . HEMORRHOID SURGERY    . HERNIA REPAIR    . HYDROCELE EXCISION / REPAIR Left   . INGUINAL HERNIA REPAIR Right    Dr. Deon Pilling  . MITRAL VALVE REPAIR  08/19/2010   Dr. Ricard Dillon.  complex valvuloplasty with 59mm ring annuloplasty via right mini thoracotomy (Dr. Roxy Manns)  . Multiple surgical procedures in his use for    . ROTATOR CUFF REPAIR    . The patient has also had    . TONSILLECTOMY    . URETER ECTOPIC RESECTION Left    Social History   Social History  . Marital status: Married    Spouse name: N/A  . Number of children: 0  . Years of education: N/A   Social  History Main Topics  . Smoking status: Former Smoker    Types: Cigarettes    Quit date: 08/12/1994  . Smokeless tobacco: Never Used  . Alcohol use No  . Drug use: No  . Sexual activity: Not Asked   Other Topics Concern  . None   Social History Narrative  . None   Allergies  Allergen Reactions  . Atenolol     Stated doctor told him he was allergic.  . Nadolol    Family History  Problem Relation Age of Onset  . Heart attack Mother   . Angina Mother   . Hypertension Mother   . Dementia Mother   . Seizures Father   . Hypertension Sister   . Hypertension Brother     Past medical history, social, surgical and family history all reviewed in electronic medical record.  No pertanent information unless stated regarding to the chief complaint.   Review of Systems:Review of systems updated and as accurate as of 12/01/16  No headache, visual changes, nausea, vomiting, diarrhea, constipation, dizziness, abdominal pain, skin rash, fevers, chills, night sweats, weight loss, swollen lymph nodes, body aches, joint swelling,  chest pain, shortness of breath, mood changes. Positive muscle aches  Objective  Blood pressure 120/76, pulse 64, height 5\' 7"  (1.702 m), weight 165 lb (74.8 kg). Systems examined below as of 12/01/16   General: No  apparent distress alert and oriented x3 mood and affect normal, dressed appropriately.  HEENT: Pupils equal, extraocular movements intact  Respiratory: Patient's speak in full sentences and does not appear short of breath  Cardiovascular: No lower extremity edema, non tender, no erythema  Skin: Warm dry intact with no signs of infection or rash on extremities or on axial skeleton.  Abdomen: Soft nontender  Neuro: Cranial nerves II through XII are intact, neurovascularly intact in all extremities with 2+ DTRs and 2+ pulses.  Lymph: No lymphadenopathy of posterior or anterior cervical chain or axillae bilaterally.  Gait normal with good balance and  coordination.  MSK:  Non tender with full range of motion and good stability and symmetric strength and tone of shoulders, elbows, wrist, hip, knee and ankles bilaterally.  Back Exam:  Inspection: Loss of lordosis with mild thoracic lumbar degenerative scoliosis Motion: Flexion 40 deg, Extension 25 deg, Side Bending to 35 deg bilaterally,  Rotation to 45 deg bilaterally  SLR laying: Negative  XSLR laying: Negative  Palpable tenderness: Moderate to severe tenderness in the thoracic spine on the right side. FABER: Significant tightness noted bilaterally. Sensory change: Gross sensation intact to all lumbar and sacral dermatomes.  Reflexes: 2+ at both patellar tendons, 2+ at achilles tendons, Babinski's downgoing.  Strength at foot  Plantar-flexion: 5/5 Dorsi-flexion: 5/5 Eversion: 5/5 Inversion: 5/5  Leg strength  Quad: 5/5 Hamstring: 5/5 Hip flexor: 5/5 Hip abductors: 4/5 symmetric Gait unremarkable.  Osteopathic findings C2 flexed rotated and side bent right T3 extended rotated and side bent right inhaled third rib T9 extended rotated and side bent left L3 flexed rotated and side bent right Sacrum right on right      Impression and Recommendations:     This case required medical decision making of moderate complexity.      Note: This dictation was prepared with Dragon dictation along with smaller phrase technology. Any transcriptional errors that result from this process are unintentional.

## 2016-12-01 NOTE — Progress Notes (Signed)
165lb

## 2016-12-01 NOTE — Assessment & Plan Note (Signed)
Decision today to treat with OMT was based on Physical Exam  After verbal consent patient was treated with HVLA, ME, FPR techniques in  thoracic, lumbar and sacral areas  Patient tolerated the procedure well with improvement in symptoms  Patient given exercises, stretches and lifestyle modifications  See medications in patient instructions if given  Patient will follow up in 4 weeks 

## 2017-01-02 ENCOUNTER — Ambulatory Visit: Payer: Medicare HMO | Admitting: Family Medicine

## 2017-01-17 ENCOUNTER — Ambulatory Visit (AMBULATORY_SURGERY_CENTER): Payer: Self-pay

## 2017-01-17 VITALS — Ht 67.0 in | Wt 163.0 lb

## 2017-01-17 DIAGNOSIS — Z1211 Encounter for screening for malignant neoplasm of colon: Secondary | ICD-10-CM

## 2017-01-17 MED ORDER — SUPREP BOWEL PREP KIT 17.5-3.13-1.6 GM/177ML PO SOLN: 1.0000 | Freq: Once | ORAL | 0 refills | Status: AC

## 2017-01-17 NOTE — Progress Notes (Signed)
No allergies to eggs or soy No diet meds No home oxygen No past problems with anesthesia  Registered emmi 

## 2017-01-19 ENCOUNTER — Encounter: Payer: Self-pay | Admitting: Gastroenterology

## 2017-01-24 ENCOUNTER — Other Ambulatory Visit: Payer: Self-pay | Admitting: Internal Medicine

## 2017-01-24 DIAGNOSIS — F17201 Nicotine dependence, unspecified, in remission: Secondary | ICD-10-CM

## 2017-01-26 ENCOUNTER — Emergency Department (HOSPITAL_COMMUNITY)
Admission: EM | Admit: 2017-01-26 | Discharge: 2017-01-26 | Disposition: A | Discharge status: Home / Self Care | Payer: Medicare HMO | Attending: Emergency Medicine | Admitting: Emergency Medicine

## 2017-01-26 ENCOUNTER — Emergency Department (HOSPITAL_COMMUNITY): Payer: Medicare HMO

## 2017-01-26 ENCOUNTER — Encounter (HOSPITAL_COMMUNITY): Payer: Self-pay | Admitting: Emergency Medicine

## 2017-01-26 DIAGNOSIS — R079 Chest pain, unspecified: Secondary | ICD-10-CM

## 2017-01-26 DIAGNOSIS — I5022 Chronic systolic (congestive) heart failure: Secondary | ICD-10-CM | POA: Diagnosis not present

## 2017-01-26 DIAGNOSIS — M25511 Pain in right shoulder: Secondary | ICD-10-CM | POA: Insufficient documentation

## 2017-01-26 DIAGNOSIS — R0789 Other chest pain: Secondary | ICD-10-CM

## 2017-01-26 DIAGNOSIS — Z7982 Long term (current) use of aspirin: Secondary | ICD-10-CM | POA: Insufficient documentation

## 2017-01-26 DIAGNOSIS — M898X1 Other specified disorders of bone, shoulder: Secondary | ICD-10-CM

## 2017-01-26 DIAGNOSIS — I110 Hypertensive heart disease with heart failure: Secondary | ICD-10-CM | POA: Insufficient documentation

## 2017-01-26 DIAGNOSIS — Z87891 Personal history of nicotine dependence: Secondary | ICD-10-CM | POA: Diagnosis not present

## 2017-01-26 DIAGNOSIS — Z79899 Other long term (current) drug therapy: Secondary | ICD-10-CM | POA: Diagnosis not present

## 2017-01-26 LAB — BASIC METABOLIC PANEL
ANION GAP: 9 (ref 5–15)
BUN: 21 mg/dL — ABNORMAL HIGH (ref 6–20)
CHLORIDE: 102 mmol/L (ref 101–111)
CO2: 27 mmol/L (ref 22–32)
Calcium: 9.9 mg/dL (ref 8.9–10.3)
Creatinine, Ser: 1.1 mg/dL (ref 0.61–1.24)
GFR calc Af Amer: >60 mL/min (ref >60)
Glucose, Bld: 93 mg/dL (ref 65–99)
POTASSIUM: 4 mmol/L (ref 3.5–5.1)
Sodium: 138 mmol/L (ref 135–145)

## 2017-01-26 LAB — I-STAT TROPONIN, ED
Troponin i, poc: 0 ng/mL (ref 0.00–0.08)
Troponin i, poc: 0 ng/mL (ref 0.00–0.08)

## 2017-01-26 LAB — CBC
HEMATOCRIT: 41.8 % (ref 39.0–52.0)
HEMOGLOBIN: 13.7 g/dL (ref 13.0–17.0)
MCH: 29.4 pg (ref 26.0–34.0)
MCHC: 32.8 g/dL (ref 30.0–36.0)
MCV: 89.7 fL (ref 78.0–100.0)
Platelets: 216 10*3/uL (ref 150–400)
RBC: 4.66 MIL/uL (ref 4.22–5.81)
RDW: 13.8 % (ref 11.5–15.5)
WBC: 7.3 10*3/uL (ref 4.0–10.5)

## 2017-01-26 MED ORDER — IOPAMIDOL (ISOVUE-370) INJECTION 76%
INTRAVENOUS | Status: AC
  Administered 2017-01-26: 100 mL
  Filled 2017-01-26: qty 100

## 2017-01-26 NOTE — Discharge Instructions (Signed)
Your blood work and CT scans did not demonstrate a cause for your chest tightness or your back pain. Please follow-up with your primary care provider for further outpatient evaluation. Return if symptoms worsen.

## 2017-01-26 NOTE — ED Provider Notes (Signed)
[facility] DEPT Provider Note   CSN: [id number] Arrival date & time: 01/26/17  0015     History   Chief Complaint Chief Complaint  Patient presents with  . Chest Pain    HPI [name] is a 66 y.o. male.  The history is provided by the patient.  He comes in tonight after an episode of chest tightness. This came on at rest and did not radiate. There is no associated dyspnea, nausea, diaphoresis. Tightness was mild and he rated it at 3/10. Nothing made it better, nothing made it worse. He checks his blood pressure at home, and it was 200/102. Pain has resolved after arriving in the ED. Also, he has been having pain in the right scapular area intermittently over the last 2 months. It has gotten worse over the last month. Pain sometimes wraps around his chest and like a band. He has seen his primary care provider for this with them know diagnosis having been found. He did have a bandlike sensation tonight. He is a former smoker. He has history of hypertension and hyperlipidemia, but no history of diabetes.  Past Medical History:  Diagnosis Date  . Arthritis   . CHF (congestive heart failure) (HCC) CHRONIC SYSTOLIC WITH ACUTE EXACERBATIONS  . GERD (gastroesophageal reflux disease)   . Heart murmur   . Hyperlipemia   . Hypertension   . MVP (mitral valve prolapse) WITH SEVERE MITRAL REGURG  . Nephrolithiasis   . Palpitations WITH PREMATURE VENTRICULAR COTRACTIONS AND PREMATURE ATRIAL CONTRACTIONS  . Rheumatic fever REPORTED HISTORY  . Sensorineural hearing loss   . Varicose veins   . Weakness of right upper extremity RELATED TO RUPTURED BICEPS TENDON    Patient Active Problem List   Diagnosis Date Noted  . Nonallopathic lesion of sacral region 12/01/2016  . Chronic low back pain 09/09/2014  . Left foot pain 09/09/2014  . Nonallopathic lesion of thoracic region 09/09/2014  . Nonallopathic lesion of lumbosacral region 09/09/2014  . Nonallopathic lesion of cervical region  09/09/2014  . Thrombosed external hemorrhoids 09/10/2013  . Symptomatic cholelithiasis, probable chronic cholecystitis 08/30/2012  . MVP (mitral valve prolapse)   . CHF (congestive heart failure) ([facility])   . Palpitations   . Rheumatic fever   . Nephrolithiasis   . Hyperlipemia   . Sensorineural hearing loss   . Weakness of right upper extremity   . MIGRAINE HEADACHE 09/26/2007  . HEARING LOSS 09/26/2007  . HYPERTENSION 09/26/2007  . MITRAL VALVE PROLAPSE 09/26/2007  . PREMATURE ATRIAL CONTRACTIONS 09/26/2007  . ARTHRITIS 09/26/2007  . NEPHROLITHIASIS, HX OF 09/26/2007    Past Surgical History:  Procedure Laterality Date  . HEMORRHOID SURGERY    . HERNIA REPAIR    . HYDROCELE EXCISION / REPAIR Left   . INGUINAL HERNIA REPAIR Right    Dr. Deon Pilling  . MITRAL VALVE REPAIR  08/19/2010   Dr. Ricard Dillon.  complex valvuloplasty with 56mm ring annuloplasty via right mini thoracotomy (Dr. Roxy Manns)  . Multiple surgical procedures in his use for    . ROTATOR CUFF REPAIR    . The patient has also had    . TONSILLECTOMY    . URETER ECTOPIC RESECTION Left        Home Medications    Prior to Admission medications   Medication Sig Start Date End Date Taking? Authorizing Provider  amLODipine (NORVASC) 5 MG tablet  08/19/16   [provider]  amoxicillin (AMOXIL) 500 MG capsule Take 500 mg by mouth. Only takes when  has dental work done 12/27/14   [provider]  aspirin 81 MG tablet Take 81 mg by mouth daily.      [provider]  Coenzyme Q10 (CO Q 10) 100 MG CAPS Take by mouth.    [provider]  ibuprofen (ADVIL,MOTRIN) 200 MG tablet Take 200 mg by mouth every 6 (six) hours as needed.    [provider]  levothyroxine (SYNTHROID, LEVOTHROID) 125 MCG tablet  06/16/16   [provider]  losartan (COZAAR) 100 MG tablet  07/24/14   [provider]  LUTEIN-ZEAXANTHIN PO Take 20 mg by mouth.    [provider]  metoprolol  (TOPROL-XL) 100 MG 24 hr tablet Take 100 mg by mouth daily.      [provider]  omeprazole (PRILOSEC) 20 MG capsule Take 20 mg by mouth daily.    [provider]  ranitidine (ZANTAC) 75 MG tablet Take 75 mg by mouth 2 (two) times daily.    [provider]  Simethicone 125 MG TABS Take by mouth.    [provider]  simvastatin (ZOCOR) 20 MG tablet Take 20 mg by mouth daily.    [provider]  triamcinolone (KENALOG) 0.1 % paste  08/25/16   [provider]    Family History Family History  Problem Relation Age of Onset  . Heart attack Mother   . Angina Mother   . Hypertension Mother   . Dementia Mother   . Seizures Father   . Hypertension Sister   . Hypertension Brother   . Colon cancer Neg Hx     Social History Social History  Substance Use Topics  . Smoking status: Former Smoker    Types: Cigarettes    Quit date: 08/12/1994  . Smokeless tobacco: Never Used  . Alcohol use No     Allergies   Atenolol and Nadolol   Review of Systems Review of Systems  All other systems reviewed and are negative.    Physical Exam Updated Vital Signs BP (!) 146/84 (BP Location: Right Arm)   Pulse (!) 56   Temp (!) 97.5 F (36.4 C) (Oral)   Resp 16   Ht 5\' 7"  (1.702 m)   Wt 73.5 kg (162 lb)   SpO2 99%   BMI 25.37 kg/m   Physical Exam  Nursing note and vitals reviewed.  66 year old male, resting comfortably and in no acute distress. Vital signs are significant for mild hypertension and mild bradycardia. Oxygen saturation is 99%, which is normal. Head is normocephalic and atraumatic. PERRLA, EOMI. Oropharynx is clear. Neck is nontender and supple without adenopathy or JVD. Back is has mild tenderness just medial to the right scapula. There is no CVA tenderness. Lungs are clear without rales, wheezes, or rhonchi. Chest is nontender. Heart has regular rate and rhythm without murmur. Abdomen is soft, flat, nontender without  masses or hepatosplenomegaly and peristalsis is normoactive. Extremities have no cyanosis or edema, full range of motion is present. Skin is warm and dry without rash. Neurologic: Mental status is normal, cranial nerves are intact, there are no motor or sensory deficits.  ED Treatments / Results  Labs (all labs ordered are listed, but only abnormal results are displayed) Labs Reviewed  BASIC METABOLIC PANEL - Abnormal; Notable for the following:       Result Value   BUN 21 (*)    All other components within normal limits  CBC  I-STAT TROPONIN, ED  I-STAT TROPONIN, ED  EKG  EKG Interpretation  Date/Time:  Thursday January 26 2017 00:21:48 EDT Ventricular Rate:  59 PR Interval:  158 QRS Duration: 78 QT Interval:  408 QTC Calculation: 403 R Axis:   34 Text Interpretation:  Sinus bradycardia Nonspecific ST abnormality Abnormal ECG No significant change since last tracing Confirmed by Ripley Fraise [phone]) on 01/26/2017 12:29:33 AM       Radiology Dg Chest 2 View  Result Date: 01/26/2017 CLINICAL DATA:  Acute onset of upper chest pain and arrhythmia. Subacute onset of right scapular pain. Initial encounter. EXAM: CHEST  2 VIEW COMPARISON:  None. FINDINGS: The lungs are well-aerated. Peribronchial thickening is noted. There is no evidence of focal opacification, pleural effusion or pneumothorax. An accessory azygos lobe is noted. A left-sided nipple shadow is noted. The heart is normal in size; the mediastinal contour is within normal limits. A valve replacement is noted. No acute osseous abnormalities are seen. IMPRESSION: Peribronchial thickening. Accessory azygos lobe noted. Lungs otherwise grossly clear. Electronically Signed   By: Garald Balding M.D.   On: 01/26/2017 01:15   Ct Angio Chest/abd/pel For Dissection W And/or Wo Contrast  Result Date: 01/26/2017 CLINICAL DATA:  Acute onset of generalized chest and back pain. Initial encounter. EXAM: CT ANGIOGRAPHY CHEST, ABDOMEN  AND PELVIS TECHNIQUE: Multidetector CT imaging through the chest, abdomen and pelvis was performed using the standard protocol during bolus administration of intravenous contrast. Multiplanar reconstructed images and MIPs were obtained and reviewed to evaluate the vascular anatomy. CONTRAST:  100 mL of Isovue 370 IV contrast COMPARISON:  Chest radiograph performed earlier today at 1:00 a.m., and CT of the abdomen and pelvis performed 10/04/2013 FINDINGS: CTA CHEST FINDINGS Cardiovascular: There is no evidence of aortic dissection. There is no evidence of aneurysmal dilatation. No calcific atherosclerotic disease is seen. The thoracic aorta is unremarkable. The great vessels are grossly unremarkable. There is no evidence of pulmonary embolus. The heart is normal in size. A mitral valve replacement is noted. Mediastinum/Nodes: The mediastinum is unremarkable in appearance. No mediastinal lymphadenopathy is seen. No pericardial effusion is identified. The visualized portions of the thyroid gland are unremarkable. No axillary lymphadenopathy is seen. Lungs/Pleura: Minimal right basilar atelectasis or scarring is noted. An accessory azygos lobe is noted. No pleural effusion or pneumothorax is seen. No masses are identified. Musculoskeletal: No acute osseous abnormalities are identified. The visualized musculature is unremarkable in appearance. Review of the MIP images confirms the above findings. CTA ABDOMEN AND PELVIS FINDINGS VASCULAR Aorta: There is no evidence of aortic dissection. There is no evidence of aneurysmal dilatation. Mild mural thrombus noted along the distal abdominal aorta, with scattered calcification. No significant luminal narrowing is seen. Celiac: The celiac trunk appears intact. SMA: The superior mesenteric artery is unremarkable in appearance. Renals: The renal arteries appear intact bilaterally. IMA: There is narrowing at the origin of the inferior mesenteric artery, though it remains grossly  patent. Inflow: Scattered calcification is seen along the common and internal iliac arteries. The external iliac arteries and common femoral arteries appear intact bilaterally. Veins: The visualized venous structures are grossly unremarkable, though not well assessed given the phase of contrast enhancement. Review of the MIP images confirms the above findings. NON-VASCULAR Hepatobiliary: The liver is unremarkable in appearance. A stone is noted within the gallbladder. The gallbladder is otherwise unremarkable. The common bile duct remains normal in caliber. Pancreas: The pancreas is within normal limits. Spleen: The spleen is unremarkable in appearance. Adrenals/Urinary Tract: The adrenal glands are unremarkable in appearance. A  mildly prominent left-sided extrarenal pelvis is noted. Scattered bilateral renal cysts are seen. There is no evidence of hydronephrosis. No renal or ureteral stones are identified. No perinephric stranding is appreciated. Stomach/Bowel: The stomach is unremarkable in appearance. The small bowel is within normal limits. The appendix is normal in caliber, without evidence of appendicitis. The colon is unremarkable in appearance. Lymphatic: No retroperitoneal or pelvic sidewall lymphadenopathy is seen. Reproductive: The bladder is mildly distended and grossly unremarkable. The prostate is borderline normal in size. Other: No additional soft tissue abnormalities are seen. Postoperative change is noted at the right inguinal region. Musculoskeletal: No acute osseous abnormalities are identified. Multilevel vacuum phenomenon is noted along the lumbar spine. The visualized musculature is unremarkable in appearance. Review of the MIP images confirms the above findings. IMPRESSION: 1. No evidence of aortic dissection. No evidence of aneurysmal dilatation. 2. Scattered calcification and mild mural thrombus along the distal abdominal aorta. No significant luminal narrowing seen. 3. No evidence of  pulmonary embolus. 4. Cholelithiasis.  Gallbladder otherwise unremarkable. 5. Scattered bilateral renal cysts. 6. Mild degenerative change along the lumbar spine. Electronically Signed   By: Garald Balding M.D.   On: 01/26/2017 06:35    Procedures Procedures (including critical care time)  Medications Ordered in ED Medications  iopamidol (ISOVUE-370) 76 % injection (100 mLs  Contrast Given 01/26/17 0605)     Initial Impression / Assessment and Plan / ED Course  I have reviewed the triage vital signs and the nursing notes.  Pertinent labs & imaging results that were available during my care of the patient were reviewed by me and considered in my medical decision making (see chart for details).  Chest discomfort of uncertain cause. Old records are reviewed, and he had mitral valve repair in 2012. Cardiac catheterization at that time showed normal coronary arteries. I would be very unlikely for him to have developed significant coronary artery obstruction in the interval 6 years given no coronary artery disease initially.Initial ECG and troponin are normal. Troponin will be repeated. Back pain is probably muscular. However, I did check blood pressures in both arms and there is a difference. Left arm had pressure 151/91, right arm 145/82. He will be sent for CT angiogram to rule out aortic dissection.  Repeat troponin is normal.CT angiogram is unremarkable. No evidence of aortic dissection or aneurysm. Patient is reassured, and discharged to follow-up with PCP.  Final Clinical Impressions(s) / ED Diagnoses   Final diagnoses:  Chest discomfort  Chest pain, unspecified type  Pain of right scapula    New Prescriptions New Prescriptions   No medications on file     Delora Fuel, MD 49/20/10 [phone]

## 2017-01-26 NOTE — ED Triage Notes (Signed)
Pt c/o pain across upper chest onset today, pt states he has had pain between R scapula and spine radiating around under R arm intermittent for several weeks. Pt states chest discomfort began tonight with irregular HB.  Denies n/v, denies shob, pt states he did get "clammy"

## 2017-01-31 ENCOUNTER — Encounter: Payer: Medicare HMO | Admitting: Gastroenterology

## 2017-02-01 DIAGNOSIS — Z6825 Body mass index (BMI) 25.0-25.9, adult: Secondary | ICD-10-CM | POA: Diagnosis not present

## 2017-02-01 DIAGNOSIS — M5489 Other dorsalgia: Secondary | ICD-10-CM | POA: Diagnosis not present

## 2017-02-01 DIAGNOSIS — I10 Essential (primary) hypertension: Secondary | ICD-10-CM | POA: Diagnosis not present

## 2017-02-01 DIAGNOSIS — K8020 Calculus of gallbladder without cholecystitis without obstruction: Secondary | ICD-10-CM | POA: Diagnosis not present

## 2017-02-06 DIAGNOSIS — J028 Acute pharyngitis due to other specified organisms: Secondary | ICD-10-CM | POA: Diagnosis not present

## 2017-02-06 DIAGNOSIS — Z6825 Body mass index (BMI) 25.0-25.9, adult: Secondary | ICD-10-CM | POA: Diagnosis not present

## 2017-03-13 DIAGNOSIS — R69 Illness, unspecified: Secondary | ICD-10-CM | POA: Diagnosis not present

## 2017-03-20 ENCOUNTER — Ambulatory Visit: Payer: Self-pay | Admitting: Surgery

## 2017-03-20 DIAGNOSIS — K8010 Calculus of gallbladder with chronic cholecystitis without obstruction: Secondary | ICD-10-CM | POA: Diagnosis not present

## 2017-03-20 DIAGNOSIS — K643 Fourth degree hemorrhoids: Secondary | ICD-10-CM | POA: Diagnosis not present

## 2017-03-20 DIAGNOSIS — K642 Third degree hemorrhoids: Secondary | ICD-10-CM | POA: Diagnosis not present

## 2017-03-20 NOTE — H&P (Signed)
[name] 03/20/2017 11:36 AM Location: [facility] Patient #: [id number] DOB: August 29, 1950 Married / Language: English / Race: White Male  History of Present Illness Scott Hector MD; 03/20/2017 12:28 PM) The patient is a 66 year old male who presents with hemorrhoids. Note for "Hemorrhoids": ` ` ` Patient sent for surgical consultation at the request of self  Chief Complaint: 65 year old male. History of cardiac surgery valve repair in 2012. He had an episode of postprandial pain was found to have gallstones. I saw him in 2014. Sounds like biliary colic. I recommended cholecystectomy. He decided not to do this.  Patient had complaints of rectal bleeding and pain. He recalls having a hemorrhoid removed in 2010 by Dr. Rise Patience. It was a more formal hemorrhoidectomy. He had recurrent rectal pain in 2015 Saw different part in the group, Dr. Brantley Stage. Found to have thrombosed hemorrhoid. Underwent incision and drainage in March 2015. Follow-up in a month much improved. He then had an episode of recurrent bleeding in October 2015. He was recommend to consider colonoscopy before considering repeat banding or injection. He had issues with the co-pay for the bowel prep, so did not get his colonoscopy.  Now 3 years later, he notes that hemorrhoids are worsening. He's had numerous episodes in the past couple years where they well pop out. He has to push them back in. Had another flare couple months ago. Seem to get better with soaks and topical medication. However because they aren't persistent issue, he was hoping they could just be injected today. He had an episode of chest pain in mid August 2018. Went to the emergency room. They ruled out any cardiac issues. No myocardial infarction. CT angiogram negative. Gallstones again noted but no evidence of cholecystitis. He also wondered if he should reconsider getting his gallbladder out.    (Review of systems as  stated in this history (HPI) or in the review of systems. Otherwise all other 12 point ROS are negative)   Medication History (Alisha Spillers, CMA; 03/20/2017 11:39 AM) Medications Reconciled    Vitals (Alisha Spillers CMA; 03/20/2017 11:37 AM) 03/20/2017 11:36 AM Weight: 163 lb Height: 67in Body Surface Area: 1.85 m Body Mass Index: 25.53 kg/m  Pulse: 68 (Regular)  BP: 128/82 (Sitting, Left Arm, Standard)      Physical Exam Scott Hector MD; 03/20/2017 12:19 PM)  General Mental Status-Alert. General Appearance-Not in acute distress, Not Sickly. Orientation-Oriented X3. Hydration-Well hydrated. Voice-Normal.  Integumentary Global Assessment Upon inspection and palpation of skin surfaces of the - Axillae: non-tender, no inflammation or ulceration, no drainage. and Distribution of scalp and body hair is normal. General Characteristics Temperature - normal warmth is noted.  Head and Neck Head-normocephalic, atraumatic with no lesions or palpable masses. Face Global Assessment - atraumatic, no absence of expression. Neck Global Assessment - no abnormal movements, no bruit auscultated on the right, no bruit auscultated on the left, no decreased range of motion, non-tender. Trachea-midline. Thyroid Gland Characteristics - non-tender.  Eye Eyeball - Left-Extraocular movements intact, No Nystagmus. Eyeball - Right-Extraocular movements intact, No Nystagmus. Cornea - Left-No Hazy. Cornea - Right-No Hazy. Sclera/Conjunctiva - Left-No scleral icterus, No Discharge. Sclera/Conjunctiva - Right-No scleral icterus, No Discharge. Pupil - Left-Direct reaction to light normal. Pupil - Right-Direct reaction to light normal.  ENMT Ears Pinna - Left - no drainage observed, no generalized tenderness observed. Right - no drainage observed, no generalized tenderness observed. Nose and Sinuses External Inspection of the Nose - no destructive  lesion  observed. Inspection of the nares - Left - quiet respiration. Right - quiet respiration. Mouth and Throat Lips - Upper Lip - no fissures observed, no pallor noted. Lower Lip - no fissures observed, no pallor noted. Nasopharynx - no discharge present. Oral Cavity/Oropharynx - Tongue - no dryness observed. Oral Mucosa - no cyanosis observed. Hypopharynx - no evidence of airway distress observed. Note: Hard of hearing. Uses hearing aid  Chest and Lung Exam Inspection Movements - Normal and Symmetrical. Accessory muscles - No use of accessory muscles in breathing. Palpation Palpation of the chest reveals - Non-tender. Auscultation Breath sounds - Normal and Clear.  Cardiovascular Auscultation Rhythm - Regular. Murmurs & Other Heart Sounds - Auscultation of the heart reveals - No Murmurs and No Systolic Clicks.  Abdomen Inspection Inspection of the abdomen reveals - No Visible peristalsis and No Abnormal pulsations. Umbilicus - No Bleeding, No Urine drainage. Palpation/Percussion Palpation and Percussion of the abdomen reveal - Soft, Non Tender, No Rebound tenderness, No Rigidity (guarding) and No Cutaneous hyperesthesia. Note: Abdomen soft. Nontender. Not distended. No umbilical or incisional hernias. No guarding. No Murphy sign.  Male Genitourinary Sexual Maturity Tanner 5 - Adult hair pattern and Adult penile size and shape. Note: Left suprapubic oblique incision consistent with prior open hernia repair. No inguinal hernias. Normal external genitalia. Epididymi, testes, and spermatic cords normal without any masses.  Rectal Note: Please refer to the anoscopy section. Right anterior grade 4 hemorrhoid. Right posterior grade 3.  Peripheral Vascular Upper Extremity Inspection - Left - No Cyanotic nailbeds, Not Ischemic. Right - No Cyanotic nailbeds, Not Ischemic.  Neurologic Neurologic evaluation reveals -normal attention span and ability to concentrate, able to name  objects and repeat phrases. Appropriate fund of knowledge , normal sensation and normal coordination. Mental Status Affect - not angry, not paranoid. Cranial Nerves-Normal Bilaterally. Gait-Normal.  Neuropsychiatric Mental status exam performed with findings of-able to articulate well with normal speech/language, rate, volume and coherence, thought content normal with ability to perform basic computations and apply abstract reasoning and no evidence of hallucinations, delusions, obsessions or homicidal/suicidal ideation.  Musculoskeletal Global Assessment Spine, Ribs and Pelvis - no instability, subluxation or laxity. Right Upper Extremity - no instability, subluxation or laxity.  Lymphatic Head & Neck  General Head & Neck Lymphatics: Bilateral - Description - No Localized lymphadenopathy. Axillary  General Axillary Region: Bilateral - Description - No Localized lymphadenopathy. Femoral & Inguinal  Generalized Femoral & Inguinal Lymphatics: Left - Description - No Localized lymphadenopathy. Right - Description - No Localized lymphadenopathy.   Results Scott Hector MD; 03/20/2017 12:31 PM) Procedures  Name Value Date Hemorrhoids Procedure Anal exam: prolapse Internal exam: Internal Hemorroids ( non-bleeding) prolapse Other:  Right anterior prolapsed hemorrhoid. Most of it at the anal verge. 2 sensitive and distal to inject her band. Right posterior grade 2/3 hemorrhoid. Left lateral more grade 2 with old scarring. Perianal skin clean with good hygiene. Minimal pruritis ani. No pilonidal disease. No fissure. No abscess/fistula. Normal sphincter tone.  No external hemorrhoids. No condyloma warts. No stricture. Prostate only mildly enlarged but smooth. No rectal masses felt to 7 cm  Performed: 03/20/2017 12:13 PM    Assessment & Plan Scott Hector MD; 03/20/2017 12:26 PM)  PROLAPSED INTERNAL HEMORRHOIDS, GRADE 4 (K64.3) Impression:  Chronically prolapsed right anterior hemorrhoid. Worsened with straining. Some enteritis associated with it.  I think it is too far along to try and deal nonoperatively. 4 to sensitive to try and inject her band. Injected at  least once with to office surgery for his hemorrhoids.  I think he would benefit from operative hemorrhoidal ligation and pexied with excision of remaining tissues. I did caution him that he will have significant pain in the first week or so at least with some bleeding and drainage thereafter. However that she get his anatomy finally to a better place so he can break the cycle of flares in attacks. He seemed open to considering at this time around.  Because of his heart valve surgery, I'm pretty sure that anesthesia would like to have cardiology clearance. Most likely will be fine, but given his history of hesitating to follow surgical advice, would like to make sure there are no surprises  I strongly recommend he reconsider colonoscopy at the very least for screening purposes, let alone for intermittent rectal bleeding. Is likely the bleeding is from hemorrhoids, but it does not normal.. Ideally he could get it done the day before hemorrhoid surgery, that way he's cleaned out and won't have to worry about bowel movements right away. He did not get it done as recommended 3 years ago because of the co-pay on the bowel prep, but he may reconsider it. He is hoping to get the gastroenterologist to do a different prep that he doesn't have to pay extra.  Current Plans Instructed to schedule colonoscopywith a gastroenterologist I recommended obtaining preoperative cardiac clearance. I am concerned about the health of the patient and the ability to tolerate the operation. Therefore, we will request clearance by cardiology to better assess operative risk & see if a reevaluation, further workup, etc is needed. Also recommendations on how medications such as for anticoagulation and blood  pressure should be managed/held/restarted after surgery. PROLAPSED INTERNAL HEMORRHOIDS, GRADE 3 (K64.2)  Current Plans ANOSCOPY, DIAGNOSTIC (29528) Pt Education - [facility] Hemorrhoids (Matie Dimaano): discussed with patient and provided information. Pt Education - Pamphlet Given - The Hemorrhoid Book: discussed with patient and provided information. You are being scheduled for surgery- Our schedulers will call you.  You should hear from our office's scheduling department within 5 working days about the location, date, and time of surgery. We try to make accommodations for patient's preferences in scheduling surgery, but sometimes the OR schedule or the surgeon's schedule prevents [place] from making those accommodations.  If you have not heard from our office [phone]) in 5 working days, call the office and ask for your surgeon's nurse.  If you have other questions about your diagnosis, plan, or surgery, call the office and ask for your surgeon's nurse.  The anatomy & physiology of the anorectal region was discussed. The pathophysiology of hemorrhoids and differential diagnosis was discussed. Natural history risks without surgery was discussed. I stressed the importance of a bowel regimen to have daily soft bowel movements to minimize progression of disease. Interventions such as sclerotherapy & banding were discussed.  The patient's symptoms are not adequately controlled by medicines and other non-operative treatments. I feel the risks & problems of no surgery outweigh the operative risks; therefore, I recommended surgery to treat the hemorrhoids by ligation, pexy, and possible resection.  Risks such as bleeding, infection, urinary difficulties, need for further treatment, heart attack, death, and other risks were discussed. I noted a good likelihood this will help address the problem. Goals of post-operative recovery were discussed as well. Possibility that this will not correct all symptoms  was explained. Post-operative pain, bleeding, constipation, and other problems after surgery were discussed. We will work to minimize complications. Educational handouts further  explaining the pathology, treatment options, and bowel regimen were given as well. Questions were answered. The patient expresses understanding & wishes to proceed with surgery.  Pt Education - [facility] Rectal Prep for Anorectal outpatient/office surgery: discussed with patient and provided information. Pt Education - [facility] Rectal Surgery HCI (Bienvenido Proehl): discussed with patient and provided information. CHRONIC CHOLECYSTITIS WITH CALCULUS (K80.10) Impression: Episode of biliary colic with gallstones in 2014. He declined surgery. No attacks since.  Should he get recurrent attacks, reconsider cholecystectomy. Statistically that is very likely. It has not happened yet, so I did not feel strongly in pressing the issue. While he was curious about if he's has more stones, he assures me he is asymptomatic and is not interested in cholecystectomy.  Current Plans The anatomy & physiology of hepatobiliary & pancreatic function was discussed. The pathophysiology of gallbladder dysfunction was discussed. Natural history risks without surgery was discussed. I feel the risks of no intervention will lead to serious problems that outweigh the operative risks; therefore, I recommended cholecystectomy to remove the pathology. I explained laparoscopic techniques with possible need for an open approach. Probable cholangiogram to evaluate the bilary tract was explained as well.  Risks such as bleeding, infection, abscess, leak, injury to other organs, need for further treatment, heart attack, death, and other risks were discussed. I noted a good likelihood this will help address the problem. Possibility that this will not correct all abdominal symptoms was explained. Goals of post-operative recovery were discussed as well. We will work to  minimize complications. An educational handout further explaining the pathology and treatment options was given as well. Questions were answered. The patient expresses understanding & wishes to proceed with surgery.  [name], M.D., F.A.C.S. Gastrointestinal and Minimally Invasive Surgery [facility] [address] [place], [place] [id number] [phone] Main / Paging

## 2017-03-28 DIAGNOSIS — M25561 Pain in right knee: Secondary | ICD-10-CM | POA: Diagnosis not present

## 2017-03-28 DIAGNOSIS — Z6826 Body mass index (BMI) 26.0-26.9, adult: Secondary | ICD-10-CM | POA: Diagnosis not present

## 2017-05-03 DIAGNOSIS — R82998 Other abnormal findings in urine: Secondary | ICD-10-CM | POA: Diagnosis not present

## 2017-05-03 DIAGNOSIS — I10 Essential (primary) hypertension: Secondary | ICD-10-CM | POA: Diagnosis not present

## 2017-05-03 DIAGNOSIS — E7849 Other hyperlipidemia: Secondary | ICD-10-CM | POA: Diagnosis not present

## 2017-05-03 DIAGNOSIS — E038 Other specified hypothyroidism: Secondary | ICD-10-CM | POA: Diagnosis not present

## 2017-05-03 DIAGNOSIS — Z125 Encounter for screening for malignant neoplasm of prostate: Secondary | ICD-10-CM | POA: Diagnosis not present

## 2017-05-11 DIAGNOSIS — Z6826 Body mass index (BMI) 26.0-26.9, adult: Secondary | ICD-10-CM | POA: Diagnosis not present

## 2017-05-11 DIAGNOSIS — Z0000 Encounter for general adult medical examination without abnormal findings: Secondary | ICD-10-CM | POA: Diagnosis not present

## 2017-05-11 DIAGNOSIS — I10 Essential (primary) hypertension: Secondary | ICD-10-CM | POA: Diagnosis not present

## 2017-05-11 DIAGNOSIS — E038 Other specified hypothyroidism: Secondary | ICD-10-CM | POA: Diagnosis not present

## 2017-05-11 DIAGNOSIS — M25561 Pain in right knee: Secondary | ICD-10-CM | POA: Diagnosis not present

## 2017-05-11 DIAGNOSIS — N528 Other male erectile dysfunction: Secondary | ICD-10-CM | POA: Diagnosis not present

## 2017-05-11 DIAGNOSIS — E7849 Other hyperlipidemia: Secondary | ICD-10-CM | POA: Diagnosis not present

## 2017-05-11 DIAGNOSIS — K644 Residual hemorrhoidal skin tags: Secondary | ICD-10-CM | POA: Diagnosis not present

## 2017-05-11 DIAGNOSIS — R1011 Right upper quadrant pain: Secondary | ICD-10-CM | POA: Diagnosis not present

## 2017-05-11 DIAGNOSIS — Z23 Encounter for immunization: Secondary | ICD-10-CM | POA: Diagnosis not present

## 2017-05-16 ENCOUNTER — Other Ambulatory Visit: Payer: Self-pay | Admitting: Internal Medicine

## 2017-05-16 DIAGNOSIS — R1011 Right upper quadrant pain: Secondary | ICD-10-CM

## 2017-05-18 DIAGNOSIS — Z1212 Encounter for screening for malignant neoplasm of rectum: Secondary | ICD-10-CM | POA: Diagnosis not present

## 2017-06-19 DIAGNOSIS — N281 Cyst of kidney, acquired: Secondary | ICD-10-CM | POA: Diagnosis not present

## 2017-06-19 DIAGNOSIS — R1033 Periumbilical pain: Secondary | ICD-10-CM | POA: Diagnosis not present

## 2017-06-21 DIAGNOSIS — K648 Other hemorrhoids: Secondary | ICD-10-CM | POA: Insufficient documentation

## 2017-06-21 DIAGNOSIS — E039 Hypothyroidism, unspecified: Secondary | ICD-10-CM | POA: Insufficient documentation

## 2017-06-21 DIAGNOSIS — Z9889 Other specified postprocedural states: Secondary | ICD-10-CM | POA: Insufficient documentation

## 2017-06-23 DIAGNOSIS — R238 Other skin changes: Secondary | ICD-10-CM | POA: Diagnosis not present

## 2017-06-23 DIAGNOSIS — Z6826 Body mass index (BMI) 26.0-26.9, adult: Secondary | ICD-10-CM | POA: Diagnosis not present

## 2017-06-23 DIAGNOSIS — E7849 Other hyperlipidemia: Secondary | ICD-10-CM | POA: Diagnosis not present

## 2017-06-23 DIAGNOSIS — Z1389 Encounter for screening for other disorder: Secondary | ICD-10-CM | POA: Diagnosis not present

## 2017-06-29 DIAGNOSIS — M67912 Unspecified disorder of synovium and tendon, left shoulder: Secondary | ICD-10-CM | POA: Diagnosis not present

## 2017-06-29 DIAGNOSIS — M25551 Pain in right hip: Secondary | ICD-10-CM | POA: Diagnosis not present

## 2017-06-29 DIAGNOSIS — M25561 Pain in right knee: Secondary | ICD-10-CM | POA: Diagnosis not present

## 2017-07-06 DIAGNOSIS — M25561 Pain in right knee: Secondary | ICD-10-CM | POA: Diagnosis not present

## 2017-07-11 DIAGNOSIS — S83241A Other tear of medial meniscus, current injury, right knee, initial encounter: Secondary | ICD-10-CM | POA: Diagnosis not present

## 2017-08-07 DIAGNOSIS — E7849 Other hyperlipidemia: Secondary | ICD-10-CM | POA: Diagnosis not present

## 2017-08-07 DIAGNOSIS — I493 Ventricular premature depolarization: Secondary | ICD-10-CM | POA: Diagnosis not present

## 2017-08-07 DIAGNOSIS — I349 Nonrheumatic mitral valve disorder, unspecified: Secondary | ICD-10-CM | POA: Diagnosis not present

## 2017-08-07 DIAGNOSIS — Z954 Presence of other heart-valve replacement: Secondary | ICD-10-CM | POA: Diagnosis not present

## 2017-08-07 DIAGNOSIS — I480 Paroxysmal atrial fibrillation: Secondary | ICD-10-CM | POA: Diagnosis not present

## 2017-08-07 DIAGNOSIS — E039 Hypothyroidism, unspecified: Secondary | ICD-10-CM | POA: Diagnosis not present

## 2017-08-07 DIAGNOSIS — I872 Venous insufficiency (chronic) (peripheral): Secondary | ICD-10-CM | POA: Diagnosis not present

## 2017-08-07 DIAGNOSIS — Z8673 Personal history of transient ischemic attack (TIA), and cerebral infarction without residual deficits: Secondary | ICD-10-CM | POA: Diagnosis not present

## 2017-08-07 DIAGNOSIS — I10 Essential (primary) hypertension: Secondary | ICD-10-CM | POA: Diagnosis not present

## 2017-08-22 DIAGNOSIS — L814 Other melanin hyperpigmentation: Secondary | ICD-10-CM | POA: Diagnosis not present

## 2017-08-22 DIAGNOSIS — D225 Melanocytic nevi of trunk: Secondary | ICD-10-CM | POA: Diagnosis not present

## 2017-08-22 DIAGNOSIS — L821 Other seborrheic keratosis: Secondary | ICD-10-CM | POA: Diagnosis not present

## 2017-08-22 DIAGNOSIS — L308 Other specified dermatitis: Secondary | ICD-10-CM | POA: Diagnosis not present

## 2017-08-22 DIAGNOSIS — L905 Scar conditions and fibrosis of skin: Secondary | ICD-10-CM | POA: Diagnosis not present

## 2017-08-22 DIAGNOSIS — L218 Other seborrheic dermatitis: Secondary | ICD-10-CM | POA: Diagnosis not present

## 2017-08-22 DIAGNOSIS — I8392 Asymptomatic varicose veins of left lower extremity: Secondary | ICD-10-CM | POA: Diagnosis not present

## 2017-08-22 DIAGNOSIS — L738 Other specified follicular disorders: Secondary | ICD-10-CM | POA: Diagnosis not present

## 2017-08-22 DIAGNOSIS — L570 Actinic keratosis: Secondary | ICD-10-CM | POA: Diagnosis not present

## 2017-08-22 DIAGNOSIS — I8391 Asymptomatic varicose veins of right lower extremity: Secondary | ICD-10-CM | POA: Diagnosis not present

## 2017-09-08 DIAGNOSIS — S0501XA Injury of conjunctiva and corneal abrasion without foreign body, right eye, initial encounter: Secondary | ICD-10-CM | POA: Diagnosis not present

## 2017-09-26 DIAGNOSIS — E7849 Other hyperlipidemia: Secondary | ICD-10-CM | POA: Diagnosis not present

## 2017-10-03 ENCOUNTER — Other Ambulatory Visit (HOSPITAL_COMMUNITY): Payer: Self-pay | Admitting: Internal Medicine

## 2017-10-03 DIAGNOSIS — K8020 Calculus of gallbladder without cholecystitis without obstruction: Secondary | ICD-10-CM

## 2017-10-03 DIAGNOSIS — R1011 Right upper quadrant pain: Secondary | ICD-10-CM

## 2017-10-04 DIAGNOSIS — K648 Other hemorrhoids: Secondary | ICD-10-CM | POA: Diagnosis not present

## 2017-10-18 DIAGNOSIS — K648 Other hemorrhoids: Secondary | ICD-10-CM | POA: Diagnosis not present

## 2017-10-23 DIAGNOSIS — K648 Other hemorrhoids: Secondary | ICD-10-CM | POA: Diagnosis not present

## 2017-10-25 ENCOUNTER — Encounter (HOSPITAL_COMMUNITY): Payer: Medicare HMO

## 2017-11-16 ENCOUNTER — Encounter: Payer: Self-pay | Admitting: Podiatry

## 2017-11-16 ENCOUNTER — Ambulatory Visit (INDEPENDENT_AMBULATORY_CARE_PROVIDER_SITE_OTHER): Payer: Medicare HMO | Admitting: Podiatry

## 2017-11-16 DIAGNOSIS — M79676 Pain in unspecified toe(s): Secondary | ICD-10-CM

## 2017-11-16 DIAGNOSIS — B351 Tinea unguium: Secondary | ICD-10-CM

## 2017-11-16 NOTE — Progress Notes (Signed)
He presents today chief complaint of painful elongated toenails bilaterally.  He would like to consider laser therapy.  Objective: Vital signs are stable he is alert and oriented x3 toenails are long thick yellow dystrophic grossly infected with fungus.  Painful on palpation.  Assessment: Pain in limb secondary to onychomycosis.  Plan: Debridement of toenails 1 through 5 bilateral cover service secondary to pain he was scheduled to see Janett Billow for laser therapy.  And he will follow-up with Dr. Adah Perl or Dr. Prudence Davidson for routine therapy.

## 2017-11-20 ENCOUNTER — Ambulatory Visit: Payer: Self-pay

## 2017-11-20 DIAGNOSIS — B351 Tinea unguium: Secondary | ICD-10-CM

## 2017-11-24 NOTE — Progress Notes (Signed)
Pt presents with mycotic infection of nails 1-5 bilateral  All other systems are negative  Laser therapy administered to affected nails and tolerated well. All safety precautions were in place. RE-appointed in 4 weeks for 2nd treatment. Will obtain pictures at next visit

## 2017-12-06 DIAGNOSIS — K648 Other hemorrhoids: Secondary | ICD-10-CM | POA: Diagnosis not present

## 2017-12-07 DIAGNOSIS — R69 Illness, unspecified: Secondary | ICD-10-CM | POA: Diagnosis not present

## 2017-12-18 ENCOUNTER — Other Ambulatory Visit: Payer: Medicare HMO

## 2018-01-15 ENCOUNTER — Other Ambulatory Visit: Payer: Medicare HMO

## 2018-02-14 ENCOUNTER — Ambulatory Visit: Payer: Medicare HMO

## 2018-02-14 DIAGNOSIS — B351 Tinea unguium: Secondary | ICD-10-CM

## 2018-02-21 NOTE — Progress Notes (Signed)
Pt presents with mycotic infection of nails 1-5 bilateral  All other systems are negative  Laser therapy administered to affected nails and tolerated well. All safety precautions were in place. RE-appointed in 4 weeks for 3rd treatment. Will obtain pictures at next visit

## 2018-03-01 DIAGNOSIS — R69 Illness, unspecified: Secondary | ICD-10-CM | POA: Diagnosis not present

## 2018-03-14 ENCOUNTER — Ambulatory Visit (INDEPENDENT_AMBULATORY_CARE_PROVIDER_SITE_OTHER): Payer: Medicare HMO

## 2018-03-14 DIAGNOSIS — B351 Tinea unguium: Secondary | ICD-10-CM

## 2018-03-16 ENCOUNTER — Telehealth: Payer: Self-pay | Admitting: Podiatry

## 2018-03-16 NOTE — Telephone Encounter (Signed)
I just saw Janett Billow, RN on Wednesday for my first laser treatment. I got a prescription sent to me for a lacquer to put on my nails. It doesn't have directions on how often I'm supposed to use it and I forgot what she told me. If you could call back and leave a voicemail if I don't answer to let me know when I can start it and how often I'm to apply it. My number is [phone]. Thank you.

## 2018-03-19 NOTE — Telephone Encounter (Signed)
LVM with instructions on use

## 2018-03-19 NOTE — Progress Notes (Signed)
Pt presents with mycotic infection of nails 1-5 bilateral  All other systems are negative  Laser therapy administered to affected nails and tolerated well. All safety precautions were in place. RE-appointed in 4 weeks for 4th treatment. [employer] was sent

## 2018-04-06 ENCOUNTER — Other Ambulatory Visit: Payer: Self-pay | Admitting: *Deleted

## 2018-04-06 DIAGNOSIS — I83893 Varicose veins of bilateral lower extremities with other complications: Secondary | ICD-10-CM

## 2018-04-11 ENCOUNTER — Ambulatory Visit: Payer: Medicare HMO

## 2018-04-11 DIAGNOSIS — B351 Tinea unguium: Secondary | ICD-10-CM

## 2018-04-13 NOTE — Progress Notes (Signed)
Pt presents with mycotic infection of nails 1-5 bilateral  All other systems are negative  Laser therapy administered to affected nails and tolerated well. All safety precautions were in place. RE-appointed in 4 weeks for 5th treatment. [employer] was sent

## 2018-05-07 ENCOUNTER — Ambulatory Visit: Payer: Self-pay

## 2018-05-07 DIAGNOSIS — B351 Tinea unguium: Secondary | ICD-10-CM

## 2018-05-07 NOTE — Progress Notes (Signed)
Pt presents with mycotic infection of nails 1-5 bilateral  All other systems are negative  Laser therapy administered to affected nails and tolerated well. All safety precautions were in place. RE-appointed in 4 weeks for 6th treatment 

## 2018-05-13 DIAGNOSIS — R69 Illness, unspecified: Secondary | ICD-10-CM | POA: Diagnosis not present

## 2018-05-15 DIAGNOSIS — Z125 Encounter for screening for malignant neoplasm of prostate: Secondary | ICD-10-CM | POA: Diagnosis not present

## 2018-05-15 DIAGNOSIS — E038 Other specified hypothyroidism: Secondary | ICD-10-CM | POA: Diagnosis not present

## 2018-05-15 DIAGNOSIS — E7849 Other hyperlipidemia: Secondary | ICD-10-CM | POA: Diagnosis not present

## 2018-05-15 DIAGNOSIS — R82998 Other abnormal findings in urine: Secondary | ICD-10-CM | POA: Diagnosis not present

## 2018-05-15 DIAGNOSIS — I10 Essential (primary) hypertension: Secondary | ICD-10-CM | POA: Diagnosis not present

## 2018-05-22 DIAGNOSIS — I10 Essential (primary) hypertension: Secondary | ICD-10-CM | POA: Diagnosis not present

## 2018-05-22 DIAGNOSIS — I480 Paroxysmal atrial fibrillation: Secondary | ICD-10-CM | POA: Diagnosis not present

## 2018-05-22 DIAGNOSIS — R740 Nonspecific elevation of levels of transaminase and lactic acid dehydrogenase [LDH]: Secondary | ICD-10-CM | POA: Diagnosis not present

## 2018-05-22 DIAGNOSIS — Z6826 Body mass index (BMI) 26.0-26.9, adult: Secondary | ICD-10-CM | POA: Diagnosis not present

## 2018-05-22 DIAGNOSIS — Z0000 Encounter for general adult medical examination without abnormal findings: Secondary | ICD-10-CM | POA: Diagnosis not present

## 2018-05-22 DIAGNOSIS — E7849 Other hyperlipidemia: Secondary | ICD-10-CM | POA: Diagnosis not present

## 2018-05-22 DIAGNOSIS — E038 Other specified hypothyroidism: Secondary | ICD-10-CM | POA: Diagnosis not present

## 2018-05-22 DIAGNOSIS — M25512 Pain in left shoulder: Secondary | ICD-10-CM | POA: Diagnosis not present

## 2018-05-22 DIAGNOSIS — I8390 Asymptomatic varicose veins of unspecified lower extremity: Secondary | ICD-10-CM | POA: Diagnosis not present

## 2018-05-23 DIAGNOSIS — Z1212 Encounter for screening for malignant neoplasm of rectum: Secondary | ICD-10-CM | POA: Diagnosis not present

## 2018-06-04 ENCOUNTER — Encounter

## 2018-06-04 ENCOUNTER — Encounter (HOSPITAL_COMMUNITY): Payer: Medicare HMO

## 2018-06-04 ENCOUNTER — Ambulatory Visit (HOSPITAL_COMMUNITY)
Admission: RE | Admit: 2018-06-04 | Discharge: 2018-06-04 | Disposition: A | Discharge status: Home / Self Care | Payer: Medicare HMO | Source: Ambulatory Visit | Attending: Surgery | Admitting: Surgery

## 2018-06-04 ENCOUNTER — Encounter: Payer: Medicare HMO | Admitting: Surgery

## 2018-06-04 DIAGNOSIS — I83893 Varicose veins of bilateral lower extremities with other complications: Secondary | ICD-10-CM | POA: Diagnosis not present

## 2018-06-11 ENCOUNTER — Ambulatory Visit (INDEPENDENT_AMBULATORY_CARE_PROVIDER_SITE_OTHER): Payer: Medicare HMO

## 2018-06-11 DIAGNOSIS — B351 Tinea unguium: Secondary | ICD-10-CM

## 2018-06-11 DIAGNOSIS — L602 Onychogryphosis: Secondary | ICD-10-CM

## 2018-06-14 ENCOUNTER — Encounter: Payer: Self-pay | Admitting: Vascular Surgery

## 2018-06-14 ENCOUNTER — Other Ambulatory Visit: Payer: Self-pay

## 2018-06-14 ENCOUNTER — Ambulatory Visit: Payer: Medicare HMO | Admitting: Vascular Surgery

## 2018-06-14 VITALS — BP 120/76 | HR 62 | Resp 18 | Ht 67.0 in

## 2018-06-14 DIAGNOSIS — I872 Venous insufficiency (chronic) (peripheral): Secondary | ICD-10-CM | POA: Diagnosis not present

## 2018-06-14 DIAGNOSIS — I83811 Varicose veins of right lower extremities with pain: Secondary | ICD-10-CM

## 2018-06-14 NOTE — Progress Notes (Addendum)
REASON FOR CONSULT:    Painful varicose veins bilaterally.  The consult is requested by Dr. Sharlett Iles.  HPI:   [name] is a pleasant 68 y.o. male, who presents for evaluation of painful varicose veins bilaterally.  I have reviewed records from the referring office.  The patient was seen on 06/23/2017.  The patient does have a history of hyperlipidemia and also reportedly bruises easily.  At time the had a large bruise in the distal lateral right thigh and knee.  On my history, the patient describes aching pain and heaviness in both legs but especially on the right side.  The symptoms are aggravated by sitting and standing and relieved with elevation.  He has worn thigh-high compression stockings off and on for years with some relief.  He occasionally takes ibuprofen for pain.  He has had varicose veins in both legs for greater than 10 years but especially on the right side.  These veins have been gradually progressive.  He occasionally get some leg swelling especially on the right.  He worked standing on his feet for many years.  The patient also describes some itching in his legs associated with his varicose veins.  He has no history of DVT or phlebitis that he is aware of.  He is undergone previous mitral valve repair and is done very well from that standpoint this was in 2012.  Past Medical History:  Diagnosis Date  . Arthritis   . CHF (congestive heart failure) (HCC) CHRONIC SYSTOLIC WITH ACUTE EXACERBATIONS  . GERD (gastroesophageal reflux disease)   . Heart murmur   . Hyperlipemia   . Hypertension   . MVP (mitral valve prolapse) WITH SEVERE MITRAL REGURG  . Nephrolithiasis   . Palpitations WITH PREMATURE VENTRICULAR COTRACTIONS AND PREMATURE ATRIAL CONTRACTIONS  . Rheumatic fever REPORTED HISTORY  . Sensorineural hearing loss   . Varicose veins   . Weakness of right upper extremity RELATED TO RUPTURED BICEPS TENDON    Family History  Problem Relation Age of Onset  .  Heart attack Mother   . Angina Mother   . Hypertension Mother   . Dementia Mother   . Seizures Father   . Hypertension Sister   . Hypertension Brother   . Colon cancer Neg Hx     SOCIAL HISTORY: Social History   Socioeconomic History  . Marital status: Married    Spouse name: Not on file  . Number of children: 0  . Years of education: Not on file  . Highest education level: Not on file  Occupational History  . Not on file  Social Needs  . Financial resource strain: Not on file  . Food insecurity:    Worry: Not on file    Inability: Not on file  . Transportation needs:    Medical: Not on file    Non-medical: Not on file  Tobacco Use  . Smoking status: Former Smoker    Types: Cigarettes    Last attempt to quit: 08/12/1994    Years since quitting: 23.8  . Smokeless tobacco: Never Used  Substance and Sexual Activity  . Alcohol use: No  . Drug use: No  . Sexual activity: Not on file  Lifestyle  . Physical activity:    Days per week: Not on file    Minutes per session: Not on file  . Stress: Not on file  Relationships  . Social connections:    Talks on phone: Not on file    Gets together: Not  on file    Attends religious service: Not on file    Active member of club or organization: Not on file    Attends meetings of clubs or organizations: Not on file    Relationship status: Not on file  . Intimate partner violence:    Fear of current or ex partner: Not on file    Emotionally abused: Not on file    Physically abused: Not on file    Forced sexual activity: Not on file  Other Topics Concern  . Not on file  Social History Narrative  . Not on file    Allergies  Allergen Reactions  . Atenolol     Stated doctor told him he was allergic.  . Nadolol Other (See Comments)    Stated doctor told him he was allergic   . Statins Other (See Comments)    Increase liver enzymes    Current Outpatient Medications  Medication Sig Dispense Refill  . amLODipine (NORVASC)  5 MG tablet Take 5 mg by mouth daily.     Marland Kitchen amoxicillin (AMOXIL) 500 MG capsule Take 2,000 mg by mouth See admin instructions. Only takes when has dental work done    . aspirin 81 MG tablet Take 81 mg by mouth daily.      . Coenzyme Q10 (CO Q 10) 100 MG CAPS Take 1 tablet by mouth every morning.     Marland Kitchen ibuprofen (ADVIL,MOTRIN) 200 MG tablet Take 200 mg by mouth every 6 (six) hours as needed for moderate pain.     Marland Kitchen levothyroxine (SYNTHROID, LEVOTHROID) 125 MCG tablet Take 100 mcg by mouth every morning.     Marland Kitchen losartan (COZAAR) 100 MG tablet Take 50 mg by mouth 2 (two) times daily.     . LUTEIN-ZEAXANTHIN PO Take 20 mg by mouth.    . metoprolol (TOPROL-XL) 100 MG 24 hr tablet Take 100 mg by mouth daily.      . Simethicone 125 MG TABS Take 1 tablet by mouth daily as needed (gas).     . triamcinolone (KENALOG) 0.1 % paste Use as directed 1 application in the mouth or throat 2 (two) times daily as needed (oral care).     . pravastatin (PRAVACHOL) 40 MG tablet Take by mouth.    . simvastatin (ZOCOR) 20 MG tablet Take 20 mg by mouth daily.     No current facility-administered medications for this visit.     REVIEW OF SYSTEMS:  [X]  denotes positive finding, [ ]  denotes negative finding Cardiac  Comments:  Chest pain or chest pressure:    Shortness of breath upon exertion:    Short of breath when lying flat:    Irregular heart rhythm:        Vascular    Pain in calf, thigh, or hip brought on by ambulation:    Pain in feet at night that wakes you up from your sleep:     Blood clot in your veins:    Leg swelling:         Pulmonary    Oxygen at home:    Productive cough:     Wheezing:         Neurologic    Sudden weakness in arms or legs:     Sudden numbness in arms or legs:     Sudden onset of difficulty speaking or slurred speech:    Temporary loss of vision in one eye:     Problems with dizziness:  Gastrointestinal    Blood in stool:     Vomited blood:           Genitourinary    Burning when urinating:     Blood in urine:        Psychiatric    Major depression:         Hematologic    Bleeding problems:    Problems with blood clotting too easily:        Skin    Rashes or ulcers:        Constitutional    Fever or chills:     PHYSICAL EXAM:   Vitals:   06/14/18 1131  BP: 120/76  Pulse: 62  Resp: 18  SpO2: 100%  Height: 5\' 7"  (1.702 m)    GENERAL: The patient is a well-nourished male, in no acute distress. The vital signs are documented above. CARDIAC: There is a regular rate and rhythm.  VASCULAR: I do not detect carotid bruits. He has palpable femoral dorsalis pedis and posterior tibial pulses bilaterally. He has large dilated truncal varicose veins along the entire medial aspect of his right thigh and right leg.  These are under significant pressure. I looked at his great saphenous vein with the SonoSite and it is markedly dilated in the proximal thigh but is smaller below that after he gives off these large tributaries. He has some mild hyperpigmentation. PULMONARY: There is good air exchange bilaterally without wheezing or rales. ABDOMEN: Soft and non-tender with normal pitched bowel sounds.  MUSCULOSKELETAL: There are no major deformities or cyanosis. NEUROLOGIC: No focal weakness or paresthesias are detected. SKIN: There are no ulcers or rashes noted. PSYCHIATRIC: The patient has a normal affect.  DATA:    VENOUS REFLUX STUDY: I reviewed his previous venous reflux study that was done on 06/04/2018.  He does have reflux in the deep system involving the common femoral vein.  He has reflux at the saphenofemoral junction on the right and in the proximal and mid thigh great saphenous vein.  The vein in the proximal thigh is markedly dilated but is smaller below the takeoff of the large tributaries.  There is no evidence of deep venous thrombosis.   ASSESSMENT & PLAN:   CHRONIC VENOUS INSUFFICIENCY: This patient has CEAP C-4  venous disease.  He has markedly dilated both varicose veins with reflux in the right great saphenous vein in the proximal thigh.  I have discussed with him the importance of intermittent leg elevation to the proper positioning for this.  I have written him a prescription for thigh-high compression stockings with a gradient of 20 to 30 mmHg.  I have encouraged him to avoid prolonged sitting and standing.  We have discussed importance of exercise specifically walking and water aerobics.  I will see him back in 3 months.  If his symptoms have not not improved I think he would be a good candidate for endovenous laser ablation of the right great saphenous vein.    I think the vein would be cannulated in the proximal thigh given that this is where the vein is dilated and gives off the large tributaries.  Distal to that the vein becomes superficial and smaller. If he continued to have significant symptoms in his varicose veins after this that I think he could have staged stab phlebectomies. I will see him back in 3 months.  He knows to call sooner if he has problems.     Deitra Mayo [facility]  Beeper [phone]

## 2018-06-20 NOTE — Progress Notes (Signed)
Pt presents with mycotic infection of nails 1-5 bilateral  All other systems are negative  Laser therapy administered to affected nails and tolerated well. All safety precautions were in place. RE-appointed as needed 

## 2018-06-21 DIAGNOSIS — L820 Inflamed seborrheic keratosis: Secondary | ICD-10-CM | POA: Diagnosis not present

## 2018-06-21 DIAGNOSIS — L570 Actinic keratosis: Secondary | ICD-10-CM | POA: Diagnosis not present

## 2018-06-21 DIAGNOSIS — L821 Other seborrheic keratosis: Secondary | ICD-10-CM | POA: Diagnosis not present

## 2018-06-27 ENCOUNTER — Encounter: Payer: Medicare HMO | Admitting: Vascular Surgery

## 2018-06-27 ENCOUNTER — Encounter (HOSPITAL_COMMUNITY): Payer: Medicare HMO

## 2018-07-16 ENCOUNTER — Ambulatory Visit: Payer: Self-pay

## 2018-07-16 DIAGNOSIS — B351 Tinea unguium: Secondary | ICD-10-CM

## 2018-07-17 NOTE — Progress Notes (Signed)
Pt presents with mycotic infection of nails 1-5 bilateral  All other systems are negative  Laser therapy administered to affected nails and tolerated well. All safety precautions were in place. RE-appointed as needed 

## 2018-07-19 ENCOUNTER — Other Ambulatory Visit (HOSPITAL_COMMUNITY): Payer: Self-pay | Admitting: Internal Medicine

## 2018-07-19 ENCOUNTER — Other Ambulatory Visit: Payer: Self-pay | Admitting: Internal Medicine

## 2018-07-19 DIAGNOSIS — R109 Unspecified abdominal pain: Secondary | ICD-10-CM

## 2018-07-19 DIAGNOSIS — K8020 Calculus of gallbladder without cholecystitis without obstruction: Secondary | ICD-10-CM | POA: Diagnosis not present

## 2018-07-19 DIAGNOSIS — R1011 Right upper quadrant pain: Secondary | ICD-10-CM

## 2018-07-19 DIAGNOSIS — R7401 Elevation of levels of liver transaminase levels: Secondary | ICD-10-CM

## 2018-07-19 DIAGNOSIS — R740 Nonspecific elevation of levels of transaminase and lactic acid dehydrogenase [LDH]: Secondary | ICD-10-CM | POA: Diagnosis not present

## 2018-07-19 DIAGNOSIS — Z6827 Body mass index (BMI) 27.0-27.9, adult: Secondary | ICD-10-CM | POA: Diagnosis not present

## 2018-07-19 DIAGNOSIS — R112 Nausea with vomiting, unspecified: Secondary | ICD-10-CM

## 2018-07-23 ENCOUNTER — Encounter (HOSPITAL_COMMUNITY)
Admission: RE | Admit: 2018-07-23 | Discharge: 2018-07-23 | Disposition: A | Discharge status: Home / Self Care | Payer: Medicare HMO | Source: Ambulatory Visit | Attending: Internal Medicine | Admitting: Internal Medicine

## 2018-07-23 DIAGNOSIS — K8020 Calculus of gallbladder without cholecystitis without obstruction: Secondary | ICD-10-CM | POA: Diagnosis present

## 2018-07-23 DIAGNOSIS — R112 Nausea with vomiting, unspecified: Secondary | ICD-10-CM | POA: Insufficient documentation

## 2018-07-23 DIAGNOSIS — R1011 Right upper quadrant pain: Secondary | ICD-10-CM | POA: Diagnosis present

## 2018-07-23 DIAGNOSIS — R740 Nonspecific elevation of levels of transaminase and lactic acid dehydrogenase [LDH]: Secondary | ICD-10-CM | POA: Diagnosis present

## 2018-07-23 DIAGNOSIS — R109 Unspecified abdominal pain: Secondary | ICD-10-CM | POA: Insufficient documentation

## 2018-07-23 DIAGNOSIS — R7401 Elevation of levels of liver transaminase levels: Secondary | ICD-10-CM

## 2018-07-23 DIAGNOSIS — K835 Biliary cyst: Secondary | ICD-10-CM | POA: Diagnosis not present

## 2018-07-23 MED ORDER — TECHNETIUM TC 99M MEBROFENIN IV KIT
5.0000 | PACK | Freq: Once | INTRAVENOUS | Status: AC | PRN
  Administered 2018-07-23: 5 via INTRAVENOUS

## 2018-08-07 ENCOUNTER — Ambulatory Visit: Payer: Medicare HMO | Admitting: Cardiovascular Disease

## 2018-08-07 ENCOUNTER — Encounter: Payer: Self-pay | Admitting: Cardiovascular Disease

## 2018-08-07 VITALS — BP 98/70 | HR 59 | Ht 67.0 in | Wt 170.8 lb

## 2018-08-07 DIAGNOSIS — E785 Hyperlipidemia, unspecified: Secondary | ICD-10-CM

## 2018-08-07 DIAGNOSIS — I341 Nonrheumatic mitral (valve) prolapse: Secondary | ICD-10-CM

## 2018-08-07 NOTE — Progress Notes (Signed)
Cardiology Office Note:    Date:  08/07/2018   ID:  [name], DOB 09/07/1950, MRN [id number]  PCP:  [name], MD  Cardiologist:  Mertie Moores, MD  Electrophysiologist:  None   Referring MD: [name], MD   Chief Complaint  Patient presents with  . Atrial Fibrillation  . Hypertension     Feb. 25, 2020    [name] is a 68 y.o. male with a hx of  MVP / mitral valve repair, paroxysmal atrial fibrillation, PVCs, hyperlipidemia, hypertension He is a patient of Dr. Wynonia Lawman who was seen today for follow-up visit. MVP repair in 2012 for MVP .  Overall seems to be doing well.  Has occasional PAC or PVC   Is retired from [employer], Programmer, applications.   He switched from Simva to Pravachol  LDL is still 128 .  Did not have CAD at the time of cath .  We discuss PCSK-9 inhibitor and Bempidoic acid - both are likely too expensive right now    Past Medical History:  Diagnosis Date  . Arthritis   . CHF (congestive heart failure) (HCC) CHRONIC SYSTOLIC WITH ACUTE EXACERBATIONS  . GERD (gastroesophageal reflux disease)   . Heart murmur   . Hyperlipemia   . Hypertension   . MVP (mitral valve prolapse) WITH SEVERE MITRAL REGURG  . Nephrolithiasis   . Palpitations WITH PREMATURE VENTRICULAR COTRACTIONS AND PREMATURE ATRIAL CONTRACTIONS  . Rheumatic fever REPORTED HISTORY  . Sensorineural hearing loss   . Varicose veins   . Weakness of right upper extremity RELATED TO RUPTURED BICEPS TENDON    Past Surgical History:  Procedure Laterality Date  . HEMORRHOID SURGERY    . HERNIA REPAIR    . HYDROCELE EXCISION / REPAIR Left   . INGUINAL HERNIA REPAIR Right    Dr. Deon Pilling  . MITRAL VALVE REPAIR  08/19/2010   Dr. Ricard Dillon.  complex valvuloplasty with 90mm ring annuloplasty via right mini thoracotomy (Dr. Roxy Manns)  . Multiple surgical procedures in his use for    . ROTATOR CUFF REPAIR    . The patient has also had    . TONSILLECTOMY    . URETER ECTOPIC RESECTION  Left     Current Medications: Current Meds  Medication Sig  . amLODipine (NORVASC) 5 MG tablet Take 5 mg by mouth daily.   Marland Kitchen amoxicillin (AMOXIL) 500 MG capsule Take 2,000 mg by mouth See admin instructions. Only takes when has dental work done  . aspirin 81 MG tablet Take 81 mg by mouth daily.    . Coenzyme Q10 (CO Q 10) 100 MG CAPS Take 1 tablet by mouth every morning.   Marland Kitchen ibuprofen (ADVIL,MOTRIN) 200 MG tablet Take 200 mg by mouth every 6 (six) hours as needed for moderate pain.   Marland Kitchen levothyroxine (SYNTHROID, LEVOTHROID) 100 MCG tablet Take 100 mcg by mouth daily.  Marland Kitchen losartan (COZAAR) 100 MG tablet Take 100 mg by mouth daily.   . LUTEIN-ZEAXANTHIN PO Take 20 mg by mouth.  . metoprolol (TOPROL-XL) 100 MG 24 hr tablet Take 100 mg by mouth daily.    Marland Kitchen omeprazole (PRILOSEC) 20 MG capsule Take 1 capsule by mouth as needed.  . pravastatin (PRAVACHOL) 40 MG tablet Take by mouth.  . Simethicone 125 MG TABS Take 1 tablet by mouth daily as needed (gas).   . triamcinolone (KENALOG) 0.1 % paste Use as directed 1 application in the mouth or throat 2 (two) times daily as needed (oral care).   . [  DISCONTINUED] levothyroxine (SYNTHROID, LEVOTHROID) 125 MCG tablet Take 100 mcg by mouth every morning.      Allergies:   Atenolol; Nadolol; and Statins   Social History   Socioeconomic History  . Marital status: Married    Spouse name: Not on file  . Number of children: 0  . Years of education: Not on file  . Highest education level: Not on file  Occupational History  . Not on file  Social Needs  . Financial resource strain: Not on file  . Food insecurity:    Worry: Not on file    Inability: Not on file  . Transportation needs:    Medical: Not on file    Non-medical: Not on file  Tobacco Use  . Smoking status: Former Smoker    Types: Cigarettes    Last attempt to quit: 08/12/1994    Years since quitting: 24.0  . Smokeless tobacco: Never Used  Substance and Sexual Activity  . Alcohol use:  No  . Drug use: No  . Sexual activity: Not on file  Lifestyle  . Physical activity:    Days per week: Not on file    Minutes per session: Not on file  . Stress: Not on file  Relationships  . Social connections:    Talks on phone: Not on file    Gets together: Not on file    Attends religious service: Not on file    Active member of club or organization: Not on file    Attends meetings of clubs or organizations: Not on file    Relationship status: Not on file  Other Topics Concern  . Not on file  Social History Narrative  . Not on file     Family History: The patient's family history includes Angina in his mother; Dementia in his mother; Heart attack in his mother; Hypertension in his brother, mother, and sister; Seizures in his father. There is no history of Colon cancer.  ROS:   Please see the history of present illness.     All other systems reviewed and are negative.  EKGs/Labs/Other Studies Reviewed:    The following studies were reviewed today:   EKG:      Recent Labs: No results found for requested labs within last 8760 hours.  Recent Lipid Panel No results found for: CHOL, TRIG, HDL, CHOLHDL, VLDL, LDLCALC, LDLDIRECT  Physical Exam:    VS:  BP 98/70   Pulse (!) 59   Ht 5\' 7"  (1.702 m)   Wt 170 lb 12.8 oz (77.5 kg)   SpO2 97%   BMI 26.75 kg/m     Wt Readings from Last 3 Encounters:  08/07/18 170 lb 12.8 oz (77.5 kg)  01/26/17 162 lb (73.5 kg)  01/17/17 163 lb (73.9 kg)     GEN:  Well nourished, well developed in no acute distress HEENT: Normal NECK: No JVD; No carotid bruits LYMPHATICS: No lymphadenopathy CARDIAC: RRR, no murmurs, rubs, gallops RESPIRATORY:  Clear to auscultation without rales, wheezing or rhonchi  ABDOMEN: Soft, non-tender, non-distended MUSCULOSKELETAL:  No edema; No deformity  SKIN: Warm and dry NEUROLOGIC:  Alert and oriented x 3 PSYCHIATRIC:  Normal affect   ASSESSMENT:    1. MVP (mitral valve prolapse)   2.  Hyperlipidemia, unspecified hyperlipidemia type    PLAN:    In order of problems listed above:  1. MVP  - s/p MV repair.   Is doing well   2.   Gall bladder disease -  Has  had some gall bladder issues. Has had gall stones and sludge in the past.  Recent HIDA scan reveals a low GB EF . Will likely needs surgery . He has held his ASA recently  If he needs GB surgery , he will be at low risk from a CV standpoint   3.     Medication Adjustments/Labs and Tests Ordered: Current medicines are reviewed at length with the patient today.  Concerns regarding medicines are outlined above.  Orders Placed This Encounter  Procedures  . EKG 12-Lead   No orders of the defined types were placed in this encounter.   Patient Instructions  Medication Instructions:  Your physician recommends that you continue on your current medications as directed. Please refer to the Current Medication list given to you today.  If you need a refill on your cardiac medications before your next appointment, please call your pharmacy.    Lab work: None Ordered   Testing/Procedures: None Ordered    Follow-Up: At [facility], you and your health needs are our priority.  As part of our continuing mission to provide you with exceptional heart care, we have created designated Provider Care Teams.  These Care Teams include your primary Cardiologist (physician) and Advanced Practice Providers (APPs -  Physician Assistants and Nurse Practitioners) who all work together to provide you with the care you need, when you need it. You will need a follow up appointment in:  1 years.  Please call our office 2 months in advance to schedule this appointment.  You may see Dr. Acie Fredrickson or one of the following Advanced Practice Providers on your designated Care Team: Richardson Dopp, PA-C [place], [place] . Daune Perch, NP       Signed, Mertie Moores, MD  08/07/2018 5:06 PM    [place]

## 2018-08-07 NOTE — Patient Instructions (Signed)
Medication Instructions:  Your physician recommends that you continue on your current medications as directed. Please refer to the Current Medication list given to you today.  If you need a refill on your cardiac medications before your next appointment, please call your pharmacy.   Lab work: None Ordered    Testing/Procedures: None Ordered   Follow-Up: At [facility], you and your health needs are our priority.  As part of our continuing mission to provide you with exceptional heart care, we have created designated Provider Care Teams.  These Care Teams include your primary Cardiologist (physician) and Advanced Practice Providers (APPs -  Physician Assistants and Nurse Practitioners) who all work together to provide you with the care you need, when you need it. You will need a follow up appointment in:  1 years.  Please call our office 2 months in advance to schedule this appointment.  You may see Dr. Nahser or one of the following Advanced Practice Providers on your designated Care Team: Scott Weaver, PA-C Vin Bhagat, PA-C . [name], NP    

## 2018-08-08 DIAGNOSIS — H5213 Myopia, bilateral: Secondary | ICD-10-CM | POA: Diagnosis not present

## 2018-08-13 ENCOUNTER — Ambulatory Visit: Payer: Self-pay | Admitting: General Surgery

## 2018-08-13 DIAGNOSIS — K8020 Calculus of gallbladder without cholecystitis without obstruction: Secondary | ICD-10-CM | POA: Diagnosis not present

## 2018-08-13 NOTE — H&P (Signed)
  History of Present Illness Ralene Ok MD; 08/13/2018 2:42 PM) The patient is a 68 year old male who presents for evaluation of gall stones. Chief Complaint: Gallstones  Patient is a 68 year old male who comes in with a history of gallstones in the past. Patient states he's had right upper quadrant abdominal pain that sometimes radiates back. Patient states this is usually after eating. Patient recently underwent a HIDA scan which revealed an ejection fraction of 12%. Patient also has had a previous CT which revealed gallstones. I did review these scans personally. Patient states that his pain is sometimes associated with nausea however no emesis. He does state that higher fatty foods appear to precipitate the pain.    Allergies Emeline Gins, [place]; 08/13/2018 2:14 PM) Atenolol *BETA BLOCKERS*  Nadolol *BETA BLOCKERS*  Allergies Reconciled   Medication History Emeline Gins, CMA; 08/13/2018 2:16 PM) Levothyroxine Sodium (100MCG Tablet, Oral) Active. Losartan Potassium (100MG  Tablet, Oral) Active. Toprol XL (100MG  Tablet ER 24HR, Oral) Active. Aspirin Childrens (81MG  Tablet Chewable, Oral) Active. amLODIPine Besylate (5MG  Tablet, Oral) Active. Pravastatin Sodium (10MG  Tablet, Oral) Active. Medications Reconciled    Review of Systems Ralene Ok, MD; 08/13/2018 2:42 PM) General Present- Feeling well. Not Present- Fever. Respiratory Not Present- Cough and Difficulty Breathing. Cardiovascular Not Present- Chest Pain. Gastrointestinal Present- Abdominal Pain and Nausea. Musculoskeletal Not Present- Myalgia. Neurological Not Present- Weakness. All other systems negative  Vitals Emeline Gins CMA; 08/13/2018 2:14 PM) 08/13/2018 2:14 PM Weight: 170.2 lb Height: 67in Body Surface Area: 1.89 m Body Mass Index: 26.66 kg/m  Temp.: 97.21F  Pulse: 71 (Regular)  BP: 132/74 (Sitting, Left Arm, Standard)       Physical Exam Ralene Ok MD;  08/13/2018 2:42 PM) The physical exam findings are as follows: Note:Constitutional: No acute distress, conversant, appears stated age  Eyes: Anicteric sclerae, moist conjunctiva, no lid lag  Neck: No thyromegaly, trachea midline, no cervical lymphadenopathy  Lungs: Clear to auscultation biilaterally, normal respiratory effot  Cardiovascular: regular rate & rhythm, no murmurs, no peripheal edema, pedal pulses 2+  GI: Soft, no masses or hepatosplenomegaly, non-tender to palpation  MSK: Normal gait, no clubbing cyanosis, edema  Skin: No rashes, palpation reveals normal skin turgor  Psychiatric: Appropriate judgment and insight, oriented to person, place, and time    Assessment & Plan Ralene Ok MD; 08/13/2018 2:42 PM) SYMPTOMATIC CHOLELITHIASIS (K80.20) Impression: 68 year old male with biliary dyskinesia, symptomatically gallstones  1. We will proceed to the operating room for a laparoscopic cholecystectomy  2. Risks and benefits were discussed with the patient to generally include, but not limited to: infection, bleeding, possible need for post op ERCP, damage to the bile ducts, bile leak, and possible need for further surgery. Alternatives were offered and described. All questions were answered and the patient voiced understanding of the procedure and wishes to proceed at this point with a laparoscopic cholecystectomy

## 2018-09-13 ENCOUNTER — Ambulatory Visit: Payer: Medicare HMO | Admitting: Vascular Surgery

## 2018-11-14 ENCOUNTER — Telehealth (HOSPITAL_COMMUNITY): Payer: Self-pay | Admitting: Rehabilitation

## 2018-11-14 NOTE — Telephone Encounter (Signed)

## 2018-11-15 ENCOUNTER — Other Ambulatory Visit: Payer: Self-pay

## 2018-11-15 ENCOUNTER — Ambulatory Visit: Payer: Medicare HMO | Admitting: Vascular Surgery

## 2018-11-15 ENCOUNTER — Encounter: Payer: Self-pay | Admitting: Vascular Surgery

## 2018-11-15 VITALS — BP 125/77 | HR 58 | Temp 97.7°F | Resp 16 | Ht 67.5 in | Wt 166.0 lb

## 2018-11-15 DIAGNOSIS — I83893 Varicose veins of bilateral lower extremities with other complications: Secondary | ICD-10-CM | POA: Diagnosis not present

## 2018-11-15 DIAGNOSIS — I872 Venous insufficiency (chronic) (peripheral): Secondary | ICD-10-CM | POA: Diagnosis not present

## 2018-11-15 NOTE — Progress Notes (Signed)
Patient name: [name] MRN: [id number] DOB: 07/21/1950 Sex: male  REASON FOR VISIT:   Follow-up of painful varicose veins in the right lower extremity and chronic venous insufficiency.  HPI:   [name] is a pleasant 68 y.o. male who I saw on 06/14/2018 with painful varicose veins bilaterally.  The patient describes significant aching pain and heaviness in both legs but especially on the right side.  On exam, he had a large dilated truncal varicose veins along the medial aspect of his right thigh and right leg.  These were under significantly elevated pressure.  I looked at his great saphenous vein myself with the SonoSite and it was markedly dilated in the proximal thigh but was smaller below where it gave off these large tributaries.  He did have some mild hyperpigmentation.  His reflux study at that time showed reflux in the deep system involving the common femoral vein.  There was reflux of the saphenofemoral junction and in the right great saphenous vein in the proximal and mid thigh.  We recommended thigh-high stockings with a gradient of 20-30, leg elevation, and ibuprofen as needed for pain.  He comes in for 54-month follow-up visit.  The patient has been wearing his thigh-high compression stockings.  He continues to have significant aching pain and heaviness in the right leg which is aggravated by sitting and standing and relieved with elevation.  He did have one episode of cellulitis several weeks ago which has resolved.  Past Medical History:  Diagnosis Date  . Arthritis   . CHF (congestive heart failure) (HCC) CHRONIC SYSTOLIC WITH ACUTE EXACERBATIONS  . GERD (gastroesophageal reflux disease)   . Heart murmur   . Hyperlipemia   . Hypertension   . MVP (mitral valve prolapse) WITH SEVERE MITRAL REGURG  . Nephrolithiasis   . Palpitations WITH PREMATURE VENTRICULAR COTRACTIONS AND PREMATURE ATRIAL CONTRACTIONS  . Rheumatic fever REPORTED HISTORY  . Sensorineural hearing  loss   . Varicose veins   . Weakness of right upper extremity RELATED TO RUPTURED BICEPS TENDON    Family History  Problem Relation Age of Onset  . Heart attack Mother   . Angina Mother   . Hypertension Mother   . Dementia Mother   . Seizures Father   . Hypertension Sister   . Hypertension Brother   . Colon cancer Neg Hx     SOCIAL HISTORY: Social History   Tobacco Use  . Smoking status: Former Smoker    Types: Cigarettes    Last attempt to quit: 08/12/1994    Years since quitting: 24.2  . Smokeless tobacco: Never Used  Substance Use Topics  . Alcohol use: No    Allergies  Allergen Reactions  . Atenolol Other (See Comments)    Stated doctor told him he was allergic. Stated doctor told him he was allergic. Unsure of reaction  . Nadolol Other (See Comments)    Stated doctor told him he was allergic   . Statins Other (See Comments)    Increase liver enzymes    Current Outpatient Medications  Medication Sig Dispense Refill  . amLODipine (NORVASC) 5 MG tablet Take 5 mg by mouth daily.     Marland Kitchen amoxicillin (AMOXIL) 500 MG capsule Take 2,000 mg by mouth See admin instructions. Only takes when has dental work done    . aspirin 81 MG tablet Take 81 mg by mouth daily.      . Coenzyme Q10 (CO Q 10) 100 MG CAPS Take 1  tablet by mouth every morning.     Marland Kitchen ibuprofen (ADVIL,MOTRIN) 200 MG tablet Take 200 mg by mouth every 6 (six) hours as needed for moderate pain.     Marland Kitchen levothyroxine (SYNTHROID, LEVOTHROID) 100 MCG tablet Take 100 mcg by mouth daily.    Marland Kitchen losartan (COZAAR) 100 MG tablet Take 100 mg by mouth daily.     . LUTEIN-ZEAXANTHIN PO Take 20 mg by mouth.    . metoprolol (TOPROL-XL) 100 MG 24 hr tablet Take 100 mg by mouth daily.      Marland Kitchen omeprazole (PRILOSEC) 20 MG capsule Take 1 capsule by mouth as needed.    . pravastatin (PRAVACHOL) 40 MG tablet Take by mouth.    . Simethicone 125 MG TABS Take 1 tablet by mouth daily as needed (gas).     . triamcinolone (KENALOG) 0.1 %  paste Use as directed 1 application in the mouth or throat 2 (two) times daily as needed (oral care).      No current facility-administered medications for this visit.     REVIEW OF SYSTEMS:  [X]  denotes positive finding, [ ]  denotes negative finding Cardiac  Comments:  Chest pain or chest pressure:    Shortness of breath upon exertion:    Short of breath when lying flat:    Irregular heart rhythm: x       Vascular    Pain in calf, thigh, or hip brought on by ambulation:    Pain in feet at night that wakes you up from your sleep:     Blood clot in your veins:    Leg swelling:  x       Pulmonary    Oxygen at home:    Productive cough:     Wheezing:         Neurologic    Sudden weakness in arms or legs:     Sudden numbness in arms or legs:     Sudden onset of difficulty speaking or slurred speech:    Temporary loss of vision in one eye:     Problems with dizziness:         Gastrointestinal    Blood in stool:     Vomited blood:         Genitourinary    Burning when urinating:     Blood in urine:        Psychiatric    Major depression:         Hematologic    Bleeding problems:    Problems with blood clotting too easily:        Skin    Rashes or ulcers:        Constitutional    Fever or chills:     PHYSICAL EXAM:   Vitals:   11/15/18 1404  BP: 125/77  Pulse: (!) 58  Resp: 16  Temp: 97.7 F (36.5 C)  TempSrc: Temporal  SpO2: 99%  Weight: 166 lb (75.3 kg)  Height: 5' 7.5" (1.715 m)    GENERAL: The patient is a well-nourished male, in no acute distress. The vital signs are documented above. CARDIAC: There is a regular rate and rhythm.  VASCULAR: I do not detect carotid bruits. He has palpable pedal pulses bilaterally. He has significantly dilated truncal varicosities along the medial aspect of his right leg and right calf. He does have some hyperpigmentation.  RIGHT LEG:   RIGHT LEG:   I looked at the great saphenous vein myself with the  SonoSite.  The  vein is markedly dilated in the proximal thigh until it gives off this large tributary and then below that is small.   PULMONARY: There is good air exchange bilaterally without wheezing or rales. ABDOMEN: Soft and non-tender with normal pitched bowel sounds.  MUSCULOSKELETAL: There are no major deformities or cyanosis. NEUROLOGIC: No focal weakness or paresthesias are detected. SKIN: There are no ulcers or rashes noted. PSYCHIATRIC: The patient has a normal affect.  DATA:    I reviewed his previous venous duplex scan which showed significant reflux in the right great saphenous vein which was markedly dilated in the proximal thigh.  This large cluster of varicose veins feels off of this.  He also has some deep venous reflux involving the common femoral vein.  MEDICAL ISSUES:   PAINFUL VARICOSE VEINS RIGHT LOWER EXTREMITY: This patient continues to have significant pain in his right leg associated with his chronic venous insufficiency and varicose veins which are under significantly elevated pressure.  He has failed conservative treatment including thigh-high compression stockings, leg elevation, and ibuprofen.  I think he would be a good candidate for laser ablation of the proximal right great saphenous vein which is significantly dilated.  We would cannulate the vein in the proximal thigh.  There is an approximately 12 to 15 cm segment which could be addressed.  Below that he has some large truncal varicosities and would require greater than 20 stab phlebectomies.  I have discussed the indications for endovenous laser ablation of the right GSV, that is to lower the pressure in the veins and potentially help relieve the symptoms from venous hypertension. I have also discussed alternative options including conservative treatment with leg elevation, compression therapy, exercise, avoiding prolonged sitting and standing, and weight management. I have discussed the potential complications  of the procedure, including, but not limited to: bleeding, bruising, leg swelling, nerve injury, skin burns, significant pain from phlebitis, deep venous thrombosis, or failure of the vein to close.  I have also explained that venous insufficiency is a chronic disease, and that the patient is at risk for recurrent varicose veins in the future.  All of the patient's questions were encouraged and answered. They are agreeable to proceed.   I have discussed with the patient the indications for stab phlebectomy.  I have explained to the patient that that will have small scars from the stab incisions.  I explained that the other risks include leg swelling, bruising, bleeding, and phlebitis.  All the patient's questions were encouraged and answered and they are agreeable to proceed.  We have discussed that venous disease is a chronic disease and we discussed conservative measures that he will have to continue indefinitely.  We discussed the importance of intermittent leg elevation and the proper positioning for this.  I have encouraged him to use his compression stockings when he is on his feet.  We discussed the importance of avoiding prolonged sitting and standing.  We discussed the importance of exercise and careful weight management.  A total of 30 minutes was spent on this visit. 15 minutes was face to face time. More than 50% of the time was spent on counseling and coordinating with the patient.     Deitra Mayo [facility] [phone]

## 2018-11-27 ENCOUNTER — Other Ambulatory Visit: Payer: Self-pay | Admitting: *Deleted

## 2018-11-27 DIAGNOSIS — I83811 Varicose veins of right lower extremities with pain: Secondary | ICD-10-CM

## 2018-12-12 DIAGNOSIS — R69 Illness, unspecified: Secondary | ICD-10-CM | POA: Diagnosis not present

## 2018-12-13 ENCOUNTER — Other Ambulatory Visit: Payer: Medicare HMO | Admitting: Vascular Surgery

## 2018-12-20 ENCOUNTER — Ambulatory Visit (HOSPITAL_COMMUNITY): Payer: Medicare HMO

## 2018-12-20 ENCOUNTER — Ambulatory Visit: Payer: Medicare HMO | Admitting: Vascular Surgery

## 2019-01-30 ENCOUNTER — Ambulatory Visit: Payer: Medicare HMO | Admitting: Podiatry

## 2019-01-30 ENCOUNTER — Encounter: Payer: Self-pay | Admitting: Podiatry

## 2019-01-30 ENCOUNTER — Other Ambulatory Visit: Payer: Self-pay

## 2019-01-30 VITALS — Temp 97.4°F

## 2019-01-30 DIAGNOSIS — B351 Tinea unguium: Secondary | ICD-10-CM

## 2019-01-30 NOTE — Progress Notes (Signed)
Complaint:  Visit Type: Patient returns to my office for continued preventative foot care services. Complaint: Patient states" my nails have grown long and thick and become painful to walk and wear shoes" . The patient presents for preventative foot care services. No changes to ROS  Podiatric Exam: Vascular: dorsalis pedis and posterior tibial pulses are palpable bilateral. Capillary return is immediate. Temperature gradient is WNL. Skin turgor WNL  Sensorium: Normal Semmes Weinstein monofilament test. Normal tactile sensation bilaterally. Nail Exam: Pt has thick disfigured discolored nails with subungual debris noted bilateral entire nail hallux through fifth toenails Ulcer Exam: There is no evidence of ulcer or pre-ulcerative changes or infection. Orthopedic Exam: Muscle tone and strength are WNL. No limitations in general ROM. No crepitus or effusions noted. Foot type and digits show no abnormalities. Bony prominences are unremarkable. Skin: No Porokeratosis. No infection or ulcers  Diagnosis:  Onychomycosis, , Pain in right toe, pain in left toes  Treatment & Plan Procedures and Treatment: Consent by patient was obtained for treatment procedures.   Debridement of mycotic and hypertrophic toenails, 1 through 5 bilateral and clearing of subungual debris. No ulceration, no infection noted.  Return Visit-Office Procedure: Patient instructed to return to the office for a follow up visit 3 months for continued evaluation and treatment.    Emanuell Morina DPM 

## 2019-02-06 DIAGNOSIS — L814 Other melanin hyperpigmentation: Secondary | ICD-10-CM | POA: Diagnosis not present

## 2019-02-06 DIAGNOSIS — D485 Neoplasm of uncertain behavior of skin: Secondary | ICD-10-CM | POA: Diagnosis not present

## 2019-02-06 DIAGNOSIS — D225 Melanocytic nevi of trunk: Secondary | ICD-10-CM | POA: Diagnosis not present

## 2019-02-06 DIAGNOSIS — L570 Actinic keratosis: Secondary | ICD-10-CM | POA: Diagnosis not present

## 2019-02-06 DIAGNOSIS — L821 Other seborrheic keratosis: Secondary | ICD-10-CM | POA: Diagnosis not present

## 2019-02-14 ENCOUNTER — Other Ambulatory Visit: Payer: Medicare HMO | Admitting: Vascular Surgery

## 2019-02-28 ENCOUNTER — Ambulatory Visit: Payer: Medicare HMO | Admitting: Vascular Surgery

## 2019-02-28 ENCOUNTER — Ambulatory Visit (HOSPITAL_COMMUNITY): Payer: Medicare HMO

## 2019-03-22 DIAGNOSIS — R1031 Right lower quadrant pain: Secondary | ICD-10-CM | POA: Diagnosis not present

## 2019-03-22 DIAGNOSIS — K8020 Calculus of gallbladder without cholecystitis without obstruction: Secondary | ICD-10-CM | POA: Diagnosis not present

## 2019-03-26 ENCOUNTER — Other Ambulatory Visit: Payer: Self-pay | Admitting: *Deleted

## 2019-03-26 DIAGNOSIS — R0989 Other specified symptoms and signs involving the circulatory and respiratory systems: Secondary | ICD-10-CM

## 2019-03-29 ENCOUNTER — Other Ambulatory Visit: Payer: Self-pay | Admitting: Vascular Surgery

## 2019-03-29 ENCOUNTER — Ambulatory Visit (HOSPITAL_COMMUNITY)
Admission: RE | Admit: 2019-03-29 | Discharge: 2019-03-29 | Disposition: A | Discharge status: Home / Self Care | Payer: Medicare HMO | Source: Ambulatory Visit | Attending: Vascular Surgery | Admitting: Vascular Surgery

## 2019-03-29 ENCOUNTER — Other Ambulatory Visit: Payer: Self-pay

## 2019-03-29 DIAGNOSIS — R109 Unspecified abdominal pain: Secondary | ICD-10-CM | POA: Insufficient documentation

## 2019-03-29 DIAGNOSIS — I700 Atherosclerosis of aorta: Secondary | ICD-10-CM | POA: Diagnosis not present

## 2019-03-29 DIAGNOSIS — K8020 Calculus of gallbladder without cholecystitis without obstruction: Secondary | ICD-10-CM | POA: Diagnosis not present

## 2019-03-29 LAB — POCT I-STAT CREATININE: Creatinine, Ser: 1.3 mg/dL — ABNORMAL HIGH (ref 0.61–1.24)

## 2019-03-29 MED ORDER — IOHEXOL 350 MG/ML SOLN
100.0000 mL | Freq: Once | INTRAVENOUS | Status: AC | PRN
  Administered 2019-03-29: 100 mL via INTRAVENOUS

## 2019-04-10 ENCOUNTER — Other Ambulatory Visit: Payer: Self-pay

## 2019-04-10 DIAGNOSIS — Z20822 Contact with and (suspected) exposure to covid-19: Secondary | ICD-10-CM

## 2019-04-11 LAB — NOVEL CORONAVIRUS, NAA: SARS-CoV-2, NAA: NOT DETECTED

## 2019-04-18 ENCOUNTER — Other Ambulatory Visit: Payer: Medicare HMO | Admitting: Vascular Surgery

## 2019-04-24 ENCOUNTER — Emergency Department (HOSPITAL_COMMUNITY)
Admission: EM | Admit: 2019-04-24 | Discharge: 2019-04-24 | Disposition: A | Discharge status: Home / Self Care | Payer: Medicare HMO | Attending: Emergency Medicine | Admitting: Emergency Medicine

## 2019-04-24 ENCOUNTER — Emergency Department (HOSPITAL_COMMUNITY): Payer: Medicare HMO

## 2019-04-24 ENCOUNTER — Other Ambulatory Visit: Payer: Self-pay

## 2019-04-24 ENCOUNTER — Encounter (HOSPITAL_COMMUNITY): Payer: Self-pay

## 2019-04-24 DIAGNOSIS — R1031 Right lower quadrant pain: Secondary | ICD-10-CM | POA: Insufficient documentation

## 2019-04-24 DIAGNOSIS — R1084 Generalized abdominal pain: Secondary | ICD-10-CM | POA: Diagnosis not present

## 2019-04-24 DIAGNOSIS — I110 Hypertensive heart disease with heart failure: Secondary | ICD-10-CM | POA: Insufficient documentation

## 2019-04-24 DIAGNOSIS — I509 Heart failure, unspecified: Secondary | ICD-10-CM | POA: Insufficient documentation

## 2019-04-24 DIAGNOSIS — I745 Embolism and thrombosis of iliac artery: Secondary | ICD-10-CM | POA: Diagnosis not present

## 2019-04-24 DIAGNOSIS — Z7982 Long term (current) use of aspirin: Secondary | ICD-10-CM | POA: Diagnosis not present

## 2019-04-24 DIAGNOSIS — Z87891 Personal history of nicotine dependence: Secondary | ICD-10-CM | POA: Insufficient documentation

## 2019-04-24 DIAGNOSIS — K8020 Calculus of gallbladder without cholecystitis without obstruction: Secondary | ICD-10-CM | POA: Diagnosis not present

## 2019-04-24 DIAGNOSIS — R350 Frequency of micturition: Secondary | ICD-10-CM | POA: Insufficient documentation

## 2019-04-24 DIAGNOSIS — Z79899 Other long term (current) drug therapy: Secondary | ICD-10-CM | POA: Insufficient documentation

## 2019-04-24 DIAGNOSIS — I10 Essential (primary) hypertension: Secondary | ICD-10-CM | POA: Diagnosis not present

## 2019-04-24 LAB — COMPREHENSIVE METABOLIC PANEL
ALT: 40 U/L (ref 0–44)
AST: 34 U/L (ref 15–41)
Albumin: 4.2 g/dL (ref 3.5–5.0)
Alkaline Phosphatase: 64 U/L (ref 38–126)
Anion gap: 8 (ref 5–15)
BUN: 20 mg/dL (ref 8–23)
CO2: 25 mmol/L (ref 22–32)
Calcium: 9.5 mg/dL (ref 8.9–10.3)
Chloride: 104 mmol/L (ref 98–111)
Creatinine, Ser: 1.03 mg/dL (ref 0.61–1.24)
GFR calc Af Amer: >60 mL/min (ref >60)
GFR calc non Af Amer: >60 mL/min (ref >60)
Glucose, Bld: 104 mg/dL — ABNORMAL HIGH (ref 70–99)
Potassium: 3.6 mmol/L (ref 3.5–5.1)
Sodium: 137 mmol/L (ref 135–145)
Total Bilirubin: 0.9 mg/dL (ref 0.3–1.2)
Total Protein: 7.3 g/dL (ref 6.5–8.1)

## 2019-04-24 LAB — CBC WITH DIFFERENTIAL/PLATELET
Abs Immature Granulocytes: 0.02 10*3/uL (ref 0.00–0.07)
Basophils Absolute: 0 10*3/uL (ref 0.0–0.1)
Basophils Relative: 1 %
Eosinophils Absolute: 0.1 10*3/uL (ref 0.0–0.5)
Eosinophils Relative: 1 %
HCT: 44.7 % (ref 39.0–52.0)
Hemoglobin: 14.9 g/dL (ref 13.0–17.0)
Immature Granulocytes: 0 %
Lymphocytes Relative: 16 %
Lymphs Abs: 1.4 10*3/uL (ref 0.7–4.0)
MCH: 30.3 pg (ref 26.0–34.0)
MCHC: 33.3 g/dL (ref 30.0–36.0)
MCV: 91 fL (ref 80.0–100.0)
Monocytes Absolute: 0.6 10*3/uL (ref 0.1–1.0)
Monocytes Relative: 7 %
Neutro Abs: 6.2 10*3/uL (ref 1.7–7.7)
Neutrophils Relative %: 75 %
Platelets: 203 10*3/uL (ref 150–400)
RBC: 4.91 MIL/uL (ref 4.22–5.81)
RDW: 13.4 % (ref 11.5–15.5)
WBC: 8.3 10*3/uL (ref 4.0–10.5)
nRBC: 0 % (ref 0.0–0.2)

## 2019-04-24 LAB — URINALYSIS, ROUTINE W REFLEX MICROSCOPIC
Bilirubin Urine: NEGATIVE
Glucose, UA: NEGATIVE mg/dL
Hgb urine dipstick: NEGATIVE
Ketones, ur: NEGATIVE mg/dL
Leukocytes,Ua: NEGATIVE
Nitrite: NEGATIVE
Protein, ur: NEGATIVE mg/dL
Specific Gravity, Urine: 1.017 (ref 1.005–1.030)
pH: 6 (ref 5.0–8.0)

## 2019-04-24 LAB — LIPASE, BLOOD: Lipase: 28 U/L (ref 11–51)

## 2019-04-24 MED ORDER — SODIUM CHLORIDE (PF) 0.9 % IJ SOLN
INTRAMUSCULAR | Status: AC
  Filled 2019-04-24: qty 50

## 2019-04-24 MED ORDER — TRAMADOL HCL 50 MG PO TABS: 50.0000 mg | ORAL_TABLET | Freq: Two times a day (BID) | ORAL | 0 refills | Status: DC | PRN

## 2019-04-24 MED ORDER — ONDANSETRON HCL 4 MG/2ML IJ SOLN
4.0000 mg | Freq: Once | INTRAMUSCULAR | Status: AC
  Administered 2019-04-24: 4 mg via INTRAVENOUS
  Filled 2019-04-24: qty 2

## 2019-04-24 MED ORDER — SODIUM CHLORIDE 0.9 % IV BOLUS
1000.0000 mL | Freq: Once | INTRAVENOUS | Status: AC
  Administered 2019-04-24: 05:00:00 1000 mL via INTRAVENOUS

## 2019-04-24 MED ORDER — FENTANYL CITRATE (PF) 100 MCG/2ML IJ SOLN
50.0000 ug | Freq: Once | INTRAMUSCULAR | Status: AC | PRN
  Administered 2019-04-24: 50 ug via INTRAVENOUS
  Filled 2019-04-24: qty 2

## 2019-04-24 MED ORDER — HYDROMORPHONE HCL 1 MG/ML IJ SOLN
1.0000 mg | Freq: Once | INTRAMUSCULAR | Status: AC
  Administered 2019-04-24: 1 mg via INTRAVENOUS
  Filled 2019-04-24: qty 1

## 2019-04-24 MED ORDER — IOHEXOL 350 MG/ML SOLN
100.0000 mL | Freq: Once | INTRAVENOUS | Status: AC | PRN
  Administered 2019-04-24: 07:00:00 100 mL via INTRAVENOUS

## 2019-04-24 MED ORDER — METOCLOPRAMIDE HCL 5 MG/ML IJ SOLN: 10.0000 mg | Freq: Once | INTRAMUSCULAR | Status: DC

## 2019-04-24 NOTE — ED Provider Notes (Signed)
[name] Provider Note   CSN: [id number] Arrival date & time: 04/24/19  0330     History   Chief Complaint Chief Complaint  Patient presents with   Abdominal Pain    HPI [name] is a 68 y.o. male.     68 y/o male with hx of MVP, sCHF, kidney stones, HLD, HTN chronic abdominal pain presents to the ED for worsening pain in his RLQ.  Patient has had similar pain in his right lower quadrant over the past 2 to 3 years.  His pain has become more frequent and severe since the summer.  He is followed by Dr. Earlean Shawl of GI for this pain and was recently referred to vascular surgery after CTA showed possible penetrating ulcer of the iliac artery.  The patient states that his pain is sharp and will radiate towards his groin.  It has also radiated around to his back tonight.  Sometimes he will feel a "bubbling" sensation in his lower abdomen.  Pain this evening has made it difficult for him to sleep.  Reports some urinary frequency without dysuria or hematuria.  No medications taken prior to arrival for pain.  Has used ibuprofen for pain control in the past.  Denies fevers, bowel changes, vomiting.  Abdominal surgical history significant for inguinal hernia repair.  The history is provided by the patient. No language interpreter was used.  Abdominal Pain   Past Medical History:  Diagnosis Date   Arthritis    CHF (congestive heart failure) (HCC) CHRONIC SYSTOLIC WITH ACUTE EXACERBATIONS   GERD (gastroesophageal reflux disease)    Heart murmur    Hyperlipemia    Hypertension    MVP (mitral valve prolapse) WITH SEVERE MITRAL REGURG   Nephrolithiasis    Palpitations WITH PREMATURE VENTRICULAR COTRACTIONS AND PREMATURE ATRIAL CONTRACTIONS   Rheumatic fever REPORTED HISTORY   Sensorineural hearing loss    Varicose veins    Weakness of right upper extremity RELATED TO RUPTURED BICEPS TENDON    Patient Active Problem List   Diagnosis  Date Noted   Nonallopathic lesion of sacral region 12/01/2016   Chronic low back pain 09/09/2014   Left foot pain 09/09/2014   Nonallopathic lesion of thoracic region 09/09/2014   Nonallopathic lesion of lumbosacral region 09/09/2014   Nonallopathic lesion of cervical region 09/09/2014   Thrombosed external hemorrhoids 09/10/2013   Symptomatic cholelithiasis, probable chronic cholecystitis 08/30/2012   MVP (mitral valve prolapse)    CHF (congestive heart failure) (HCC)    Palpitations    Rheumatic fever    Nephrolithiasis    Hyperlipemia    Sensorineural hearing loss    Weakness of right upper extremity    MIGRAINE HEADACHE 09/26/2007   HEARING LOSS 09/26/2007   HYPERTENSION 09/26/2007   MITRAL VALVE PROLAPSE 09/26/2007   PREMATURE ATRIAL CONTRACTIONS 09/26/2007   ARTHRITIS 09/26/2007   NEPHROLITHIASIS, HX OF 09/26/2007    Past Surgical History:  Procedure Laterality Date   HEMORRHOID SURGERY     HERNIA REPAIR     HYDROCELE EXCISION / REPAIR Left    INGUINAL HERNIA REPAIR Right    Dr. Deon Pilling   MITRAL VALVE REPAIR  08/19/2010   Dr. Ricard Dillon.  complex valvuloplasty with 82mm ring annuloplasty via right mini thoracotomy (Dr. Roxy Manns)   Multiple surgical procedures in his use for     ROTATOR CUFF REPAIR     The patient has also had     TONSILLECTOMY     URETER ECTOPIC RESECTION Left  Home Medications    Prior to Admission medications   Medication Sig Start Date End Date Taking? Authorizing Provider  amLODipine (NORVASC) 5 MG tablet Take 5 mg by mouth daily.  08/19/16  Yes [provider]  amoxicillin (AMOXIL) 500 MG capsule Take 2,000 mg by mouth See admin instructions. Only takes when has dental work done 12/27/14  Yes [provider]  aspirin 81 MG tablet Take 81 mg by mouth daily.     Yes [provider]  Coenzyme Q10 (CO Q 10) 100 MG CAPS Take 1 tablet by mouth every morning.    Yes [provider]    ibuprofen (ADVIL,MOTRIN) 200 MG tablet Take 200 mg by mouth every 6 (six) hours as needed for moderate pain.    Yes [provider]  levothyroxine (SYNTHROID, LEVOTHROID) 100 MCG tablet Take 100 mcg by mouth daily. 05/22/18  Yes [provider]  losartan (COZAAR) 100 MG tablet Take 50 mg by mouth 2 (two) times daily.  07/24/14  Yes [provider]  LUTEIN-ZEAXANTHIN PO Take 20 mg by mouth daily.    Yes [provider]  metoprolol (TOPROL-XL) 100 MG 24 hr tablet Take 100 mg by mouth daily.     Yes [provider]  pravastatin (PRAVACHOL) 40 MG tablet Take by mouth.   Yes [provider]  Simethicone 125 MG TABS Take 1 tablet by mouth daily as needed (gas).    Yes [provider]  triamcinolone (KENALOG) 0.1 % paste Use as directed 1 application in the mouth or throat 2 (two) times daily as needed (oral care).  08/25/16  Yes [provider]  triamcinolone cream (KENALOG) 0.1 % Apply 1 application topically 2 (two) times daily.  11/06/18  Yes [provider]    Family History Family History  Problem Relation Age of Onset   Heart attack Mother    Angina Mother    Hypertension Mother    Dementia Mother    Seizures Father    Hypertension Sister    Hypertension Brother    Colon cancer Neg Hx     Social History Social History   Tobacco Use   Smoking status: Former Smoker    Types: Cigarettes    Quit date: 08/12/1994    Years since quitting: 24.7   Smokeless tobacco: Never Used  Substance Use Topics   Alcohol use: No   Drug use: No     Allergies   Atenolol, Nadolol, and Statins   Review of Systems Review of Systems  Gastrointestinal: Positive for abdominal pain.  Ten systems reviewed and are negative for acute change, except as noted in the HPI.    Physical Exam Updated Vital Signs BP 138/69    Pulse (!) 56    Temp 98.9 F (37.2 C) (Oral)    Resp 10    Ht 5\' 9"  (1.753 m)    Wt 76 kg     SpO2 100%    BMI 24.74 kg/m   Physical Exam Vitals signs and nursing note reviewed.  Constitutional:      General: He is not in acute distress.    Appearance: He is well-developed. He is not diaphoretic.     Comments: Patient pleasant and in NAD  HENT:     Head: Normocephalic and atraumatic.  Eyes:     General: No scleral icterus.    Conjunctiva/sclera: Conjunctivae normal.  Neck:     Musculoskeletal: Normal range of motion.  Cardiovascular:  Rate and Rhythm: Normal rate and regular rhythm.     Pulses: Normal pulses.  Pulmonary:     Effort: Pulmonary effort is normal. No respiratory distress.     Comments: Respirations even and unlabored Abdominal:     Palpations: Abdomen is soft. There is no mass.     Tenderness: There is abdominal tenderness.     Comments: Soft, nondistended abdomen with focal tenderness in the right lower quadrant.  No peritoneal signs or palpable masses.  Musculoskeletal: Normal range of motion.  Skin:    General: Skin is warm and dry.     Coloration: Skin is not pale.     Findings: No erythema or rash.  Neurological:     Mental Status: He is alert and oriented to person, place, and time.  Psychiatric:        Behavior: Behavior normal.      ED Treatments / Results  Labs (all labs ordered are listed, but only abnormal results are displayed) Labs Reviewed  COMPREHENSIVE METABOLIC PANEL - Abnormal; Notable for the following components:      Result Value   Glucose, Bld 104 (*)    All other components within normal limits  CBC WITH DIFFERENTIAL/PLATELET  LIPASE, BLOOD  URINALYSIS, ROUTINE W REFLEX MICROSCOPIC    EKG EKG Interpretation  Date/Time:  Wednesday April 24 2019 03:46:19 EST Ventricular Rate:  60 PR Interval:    QRS Duration: 87 QT Interval:  419 QTC Calculation: 419 R Axis:   22 Text Interpretation: Sinus rhythm RSR' in V1 or V2, right VCD or RVH Confirmed by Randal Buba, April ([id number]) on 04/24/2019 3:53:19  AM   Radiology No results found.  Procedures Procedures (including critical care time)  Medications Ordered in ED Medications  iohexol (OMNIPAQUE) 350 MG/ML injection 100 mL (has no administration in time range)  sodium chloride 0.9 % bolus 1,000 mL (1,000 mLs Intravenous New Bag/Given 04/24/19 0510)  HYDROmorphone (DILAUDID) injection 1 mg (1 mg Intravenous Given 04/24/19 0511)  ondansetron (ZOFRAN) injection 4 mg (4 mg Intravenous Given 04/24/19 0511)  fentaNYL (SUBLIMAZE) injection 50 mcg (50 mcg Intravenous Given 04/24/19 0542)     Initial Impression / Assessment and Plan / ED Course  I have reviewed the triage vital signs and the nursing notes.  Pertinent labs & imaging results that were available during my care of the patient were reviewed by me and considered in my medical decision making (see chart for details).        68 year old male presents to the emergency department for evaluation of right lower quadrant abdominal pain.  He has had similar pain over the past 2 to 3 years, but pain has become more frequent since the summer.  Was recently found to have imaging findings concerning for ulceration of the iliac artery.  States that his symptoms have been increasingly worse tonight prompting his visit to the ED.  The patient is hemodynamically stable.  His labs are reassuring.  Plan for repeat CTA of the abdomen and pelvis to ensure no changes to prior anomaly.  If imaging is largely unchanged, feel that patient can follow-up with vascular surgery and GI.  Patient signed out to [place], PA-C at shift change who will assume care and disposition appropriately.    Final Clinical Impressions(s) / ED Diagnoses   Final diagnoses:  Right lower quadrant abdominal pain    ED Discharge Orders    None       Antonietta Breach, PA-C 04/24/19 [id number]    Palumbo, April,  MD 04/24/19 [phone]

## 2019-04-24 NOTE — ED Provider Notes (Signed)
Physical Exam  BP 133/72   Pulse 71   Temp 98.9 F (37.2 C) (Oral)   Resp 16   Ht 5\' 9"  (1.753 m)   Wt 76 kg   SpO2 97%   BMI 24.74 kg/m   Physical Exam  ED Course/Procedures     Procedures  MDM  Patient signed out to me by Ermalinda Memos, PA-C.  Please see previous notes for further history.  In brief, patient presenting for evaluation of worsening chronic abdominal pain.  He follows with GI, who heard a bruit last month and schedule him CT, which showed stable ulceration of the right common iliac artery.  Labs reassuring.  Urine and CT pending.  If normal, patient be discharged with specialist follow-up as scheduled.  Urine without infection. CT without acute or worsening findings, stable ulceration.  Was found to have gallstones without cholecystitis.  This is not consistent with right lower quadrant pain, as such likely not the source of his pain.  Discussed findings with patient.  Discussed follow-up with vascular surgery as scheduled next week. Pt's repeat abd exam stable. Discussed pain control, short course of tramadol given, as patient states he has had this before and did well.  He does not want anything 'stronger' such as Holton or Percocet.  At this time, patient appears safe for discharge.  Return precautions given.  Patient states he understands and agrees to plan.   Results for orders placed or performed during the hospital encounter of 04/24/19  CBC with Differential  Result Value Ref Range   WBC 8.3 4.0 - 10.5 K/uL   RBC 4.91 4.22 - 5.81 MIL/uL   Hemoglobin 14.9 13.0 - 17.0 g/dL   HCT 44.7 39.0 - 52.0 %   MCV 91.0 80.0 - 100.0 fL   MCH 30.3 26.0 - 34.0 pg   MCHC 33.3 30.0 - 36.0 g/dL   RDW 13.4 11.5 - 15.5 %   Platelets 203 150 - 400 K/uL   nRBC 0.0 0.0 - 0.2 %   Neutrophils Relative % 75 %   Neutro Abs 6.2 1.7 - 7.7 K/uL   Lymphocytes Relative 16 %   Lymphs Abs 1.4 0.7 - 4.0 K/uL   Monocytes Relative 7 %   Monocytes Absolute 0.6 0.1 - 1.0 K/uL   Eosinophils  Relative 1 %   Eosinophils Absolute 0.1 0.0 - 0.5 K/uL   Basophils Relative 1 %   Basophils Absolute 0.0 0.0 - 0.1 K/uL   Immature Granulocytes 0 %   Abs Immature Granulocytes 0.02 0.00 - 0.07 K/uL  Urinalysis, Routine w reflex microscopic  Result Value Ref Range   Color, Urine STRAW (A) YELLOW   APPearance CLEAR CLEAR   Specific Gravity, Urine 1.017 1.005 - 1.030   pH 6.0 5.0 - 8.0   Glucose, UA NEGATIVE NEGATIVE mg/dL   Hgb urine dipstick NEGATIVE NEGATIVE   Bilirubin Urine NEGATIVE NEGATIVE   Ketones, ur NEGATIVE NEGATIVE mg/dL   Protein, ur NEGATIVE NEGATIVE mg/dL   Nitrite NEGATIVE NEGATIVE   Leukocytes,Ua NEGATIVE NEGATIVE  CMP  Result Value Ref Range   Sodium 137 135 - 145 mmol/L   Potassium 3.6 3.5 - 5.1 mmol/L   Chloride 104 98 - 111 mmol/L   CO2 25 22 - 32 mmol/L   Glucose, Bld 104 (H) 70 - 99 mg/dL   BUN 20 8 - 23 mg/dL   Creatinine, Ser 1.03 0.61 - 1.24 mg/dL   Calcium 9.5 8.9 - 10.3 mg/dL   Total Protein 7.3  6.5 - 8.1 g/dL   Albumin 4.2 3.5 - 5.0 g/dL   AST 34 15 - 41 U/L   ALT 40 0 - 44 U/L   Alkaline Phosphatase 64 38 - 126 U/L   Total Bilirubin 0.9 0.3 - 1.2 mg/dL   GFR calc non Af Amer >60 >60 mL/min   GFR calc Af Amer >60 >60 mL/min   Anion gap 8 5 - 15  Lipase  Result Value Ref Range   Lipase 28 11 - 51 U/L   Ct Angio Abd/pel W And/or Wo Contrast  Result Date: 04/24/2019 CLINICAL DATA:  Lower abdominal pain. Iliac artery ulceration. EXAM: CTA ABDOMEN AND PELVIS WITHOUT AND WITH CONTRAST TECHNIQUE: Multidetector CT imaging of the abdomen and pelvis was performed using the standard protocol during bolus administration of intravenous contrast. Multiplanar reconstructed images and MIPs were obtained and reviewed to evaluate the vascular anatomy. CONTRAST:  143mL OMNIPAQUE IOHEXOL 350 MG/ML SOLN COMPARISON:  CT of the abdomen and pelvis 03/29/2019 FINDINGS: VASCULAR Aorta: Atherosclerotic changes are present in the infrarenal abdominal aorta. Calcified  noncalcified plaque is most prominent just above the bifurcation. There is no aneurysm. Mild ulceration of the soft plaque is similar the prior study. Celiac: Mild atherosclerotic calcification and narrowing is present at the celiac origin. There is no significant stenosis. Branch vessels are within normal limits. SMA: Mild atherosclerotic calcifications are present. No significant stenosis is present. 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 No significant stenosis is evident. IMA: Patent Inflow: Atherosclerotic changes are again noted throughout the common iliac arteries bilaterally. Posterolateral ulceration of the right common iliac artery is stable. Diffuse irregularity is present without significant stenosis or other focal ulceration or change. Proximal Outflow: Atherosclerotic calcifications are present without significant stenosis or aneurysm. Veins: Patent Review of the MIP images confirms the above findings. NON-VASCULAR Lower chest: The lung bases are clear without focal nodule, mass, or airspace disease. Heart is mildly enlarged. Calcifications are present within the coronary arteries. Hepatobiliary: No focal patent lesions are present. Layering gallstones are again seen without inflammatory change. The common bile duct is within normal limits. Pancreas: Unremarkable. No pancreatic ductal dilatation or surrounding inflammatory changes. Spleen: Normal in size without focal abnormality. Adrenals/Urinary Tract: Adrenal glands are normal bilaterally. Bilateral renal cysts are stable. The urinary bladder is normal. Ureters are unremarkable. Stomach/Bowel: Stomach and duodenum are within normal limits. Small bowel is unremarkable. The appendix is visualized and normal. Ascending and transverse colon are within normal limits. The descending and sigmoid colon are normal. Lymphatic: No significant retroperitoneal adenopathy is present. Reproductive: Prostate gland is mildly enlarged, stable Other: No  abdominal wall hernia or abnormality. No abdominopelvic ascites. Musculoskeletal: Moderate degenerative changes are evident within the lumbar spine. There is chronic loss of disc height at L1-2 and to a greater extent at L2-3. No focal lytic or blastic lesions are present. Bony pelvis is within normal limits. The hips are located and within normal limits. IMPRESSION: 1. Stable appearance of extensive atherosclerotic disease without significant stenosis or aneurysm. 2. Stable eccentric ulceration of the right common iliac artery. 3. No acute or focal lesion to explain the patient's lower abdominal pain. 4. Cholelithiasis without cholecystitis. 5. Stable bilateral renal cysts. 6.  Aortic Atherosclerosis (ICD10-I70.0). 7. Multilevel degenerative changes in the lumbar spine. Electronically Signed   By: San Morelle M.D.   On: 04/24/2019 07:47         Franchot Heidelberg, PA-C 04/24/19 1337    Blanchie Dessert, MD 04/24/19 1542

## 2019-04-24 NOTE — Discharge Instructions (Signed)
Continue taking home medications as prescribed. Continue using Tylenol or ibuprofen as needed for mild to moderate pain.  Use tramadol as needed for severe breakthrough pain.  Have caution, this is a narcotic.  Do not drive or operate heavy machinery while taking this medicine. Follow-up with Dr. Scot Dock after scheduled appointment next week. Return to the emergency room with any new, worsening, concerning symptoms.

## 2019-04-24 NOTE — ED Triage Notes (Addendum)
Pt presents to ED via [facility] coming from home, pt c/o lower abdominal pain with a known history of an iliac artery ulcer that has caused his RLQ in the past but this is different and worse. Pt states he has had increased pain today that has been so unbearable hes not been able to sleep and also states hes noticed hes not ben able to control his HTN even with his meds and an increased urinary output. Pt was ambulatory upon arrival with EMS and VS WNL.

## 2019-04-24 NOTE — ED Notes (Signed)
Pt gone to CT 

## 2019-05-01 ENCOUNTER — Ambulatory Visit: Payer: Medicare HMO | Admitting: Vascular Surgery

## 2019-05-01 ENCOUNTER — Encounter: Payer: Self-pay | Admitting: Vascular Surgery

## 2019-05-01 ENCOUNTER — Other Ambulatory Visit: Payer: Self-pay

## 2019-05-01 VITALS — BP 139/84 | HR 57 | Temp 97.9°F | Resp 20 | Ht 69.0 in | Wt 167.0 lb

## 2019-05-01 DIAGNOSIS — I700 Atherosclerosis of aorta: Secondary | ICD-10-CM | POA: Diagnosis not present

## 2019-05-01 NOTE — Progress Notes (Signed)
REASON FOR CONSULT:    Aortic atherosclerosis. The consult was requested by Dr. Earlie Raveling.  ASSESSMENT & PLAN:   AORTIC ATHEROSCLEROSIS: The patient was undergoing a work-up for right lower quadrant pain which prompted a CT angiogram of the abdomen and pelvis.  This showed evidence of aortic atherosclerosis.  There was no evidence of aneurysmal disease, dissection, or focal penetrating ulcer which would explain his symptoms.  He simply has atherosclerosis involving the abdominal aorta and iliac arteries bilaterally.  However this does not produce any significant narrowing and he has no evidence of symptoms which could be related to his aortoiliac atherosclerosis.  He denies any claudication, rest pain, or nonhealing ulcers.  Likewise he does not describe evidence of mesenteric ischemia.  He denies postprandial abdominal pain or weight loss.  I will be happy to see him back at any time if he develops any new vascular issues.  Deitra Mayo, MD Office: [phone]   HPI:   [name] is a pleasant 68 y.o. male, who I had previously seen with chronic venous insufficiency.  He is referred now for evaluation of aortic atherosclerosis.  He was undergoing a work-up for right lower abdominal pain.  This had been going on for 2 to 3 years but was getting worse.  This prompted a CT scan which showed evidence of aortic atherosclerosis with some mural thrombus in the distal abdominal aorta.  For this reason the patient was sent for vascular consultation.  The patient has been having some right lower quadrant pain.  This pain comes and goes.  There is no associated nausea or vomiting.  He has previously torn a muscle in his right medial thigh.  He has had a previous hernia repair on the right with mesh but the area of pain is above this.  He denies any history of claudication, rest pain, or nonhealing ulcers.  He denies any postprandial abdominal pain or weight loss.  Past Medical History:   Diagnosis Date  . Arthritis   . CHF (congestive heart failure) (HCC) CHRONIC SYSTOLIC WITH ACUTE EXACERBATIONS  . GERD (gastroesophageal reflux disease)   . Heart murmur   . Hyperlipemia   . Hypertension   . MVP (mitral valve prolapse) WITH SEVERE MITRAL REGURG  . Nephrolithiasis   . Palpitations WITH PREMATURE VENTRICULAR COTRACTIONS AND PREMATURE ATRIAL CONTRACTIONS  . Rheumatic fever REPORTED HISTORY  . Sensorineural hearing loss   . Varicose veins   . Weakness of right upper extremity RELATED TO RUPTURED BICEPS TENDON    Family History  Problem Relation Age of Onset  . Heart attack Mother   . Angina Mother   . Hypertension Mother   . Dementia Mother   . Seizures Father   . Hypertension Sister   . Hypertension Brother   . Colon cancer Neg Hx     SOCIAL HISTORY: Social History   Socioeconomic History  . Marital status: Married    Spouse name: Not on file  . Number of children: 0  . Years of education: Not on file  . Highest education level: Not on file  Occupational History  . Not on file  Social Needs  . Financial resource strain: Not on file  . Food insecurity    Worry: Not on file    Inability: Not on file  . Transportation needs    Medical: Not on file    Non-medical: Not on file  Tobacco Use  . Smoking status: Former Smoker    Types: Cigarettes  Quit date: 08/12/1994    Years since quitting: 24.7  . Smokeless tobacco: Never Used  Substance and Sexual Activity  . Alcohol use: No  . Drug use: No  . Sexual activity: Not on file  Lifestyle  . Physical activity    Days per week: Not on file    Minutes per session: Not on file  . Stress: Not on file  Relationships  . Social Herbalist on phone: Not on file    Gets together: Not on file    Attends religious service: Not on file    Active member of club or organization: Not on file    Attends meetings of clubs or organizations: Not on file    Relationship status: Not on file  . Intimate  partner violence    Fear of current or ex partner: Not on file    Emotionally abused: Not on file    Physically abused: Not on file    Forced sexual activity: Not on file  Other Topics Concern  . Not on file  Social History Narrative  . Not on file    Allergies  Allergen Reactions  . Atenolol Other (See Comments)    Stated doctor told him he was allergic. Stated doctor told him he was allergic. Unsure of reaction  . Nadolol Other (See Comments)    Stated doctor told him he was allergic   . Statins Other (See Comments)    Increase liver enzymes    Current Outpatient Medications  Medication Sig Dispense Refill  . amLODipine (NORVASC) 5 MG tablet Take 5 mg by mouth daily.     Marland Kitchen amoxicillin (AMOXIL) 500 MG capsule Take 2,000 mg by mouth See admin instructions. Only takes when has dental work done    . aspirin 81 MG tablet Take 81 mg by mouth daily.      . Coenzyme Q10 (CO Q 10) 100 MG CAPS Take 1 tablet by mouth every morning.     Marland Kitchen ibuprofen (ADVIL,MOTRIN) 200 MG tablet Take 200 mg by mouth every 6 (six) hours as needed for moderate pain.     Marland Kitchen levothyroxine (SYNTHROID, LEVOTHROID) 100 MCG tablet Take 100 mcg by mouth daily.    Marland Kitchen losartan (COZAAR) 50 MG tablet Take 50 mg by mouth 2 (two) times daily.    . LUTEIN-ZEAXANTHIN PO Take 20 mg by mouth daily.     . metoprolol (TOPROL-XL) 100 MG 24 hr tablet Take 100 mg by mouth daily.      . pravastatin (PRAVACHOL) 40 MG tablet Take by mouth.    . Simethicone 125 MG TABS Take 1 tablet by mouth daily as needed (gas).     . traMADol (ULTRAM) 50 MG tablet Take 1 tablet (50 mg total) by mouth every 12 (twelve) hours as needed. 6 tablet 0  . triamcinolone cream (KENALOG) 0.1 % Apply 1 application topically 2 (two) times daily.      No current facility-administered medications for this visit.     REVIEW OF SYSTEMS:  [X]  denotes positive finding, [ ]  denotes negative finding Cardiac  Comments:  Chest pain or chest pressure:    Shortness  of breath upon exertion:    Short of breath when lying flat:    Irregular heart rhythm:        Vascular    Pain in calf, thigh, or hip brought on by ambulation: x   Pain in feet at night that wakes you up from your sleep:  Blood clot in your veins:    Leg swelling:         Pulmonary    Oxygen at home:    Productive cough:     Wheezing:         Neurologic    Sudden weakness in arms or legs:     Sudden numbness in arms or legs:     Sudden onset of difficulty speaking or slurred speech:    Temporary loss of vision in one eye:     Problems with dizziness:         Gastrointestinal    Blood in stool:     Vomited blood:         Genitourinary    Burning when urinating:     Blood in urine:        Psychiatric    Major depression:         Hematologic    Bleeding problems:    Problems with blood clotting too easily:        Skin    Rashes or ulcers:        Constitutional    Fever or chills:     PHYSICAL EXAM:   Vitals:   05/01/19 1322  BP: 139/84  Pulse: (!) 57  Resp: 20  Temp: 97.9 F (36.6 C)  SpO2: 100%  Weight: 167 lb (75.8 kg)  Height: 5\' 9"  (1.753 m)    GENERAL: The patient is a well-nourished male, in no acute distress. The vital signs are documented above. CARDIAC: There is a regular rate and rhythm.  VASCULAR: I do not detect carotid bruits. He has palpable femoral, popliteal, dorsalis pedis, posterior tibial pulses bilaterally. He has no significant lower extremity swelling. PULMONARY: There is good air exchange bilaterally without wheezing or rales. ABDOMEN: Soft and non-tender with normal pitched bowel sounds.  I do not palpate an abdominal aortic aneurysm. MUSCULOSKELETAL: There are no major deformities or cyanosis. NEUROLOGIC: No focal weakness or paresthesias are detected. SKIN: There are no ulcers or rashes noted. PSYCHIATRIC: The patient has a normal affect.  DATA:    CT ANGIO ABDOMEN PELVIS: I have reviewed the CT angiogram of the abdomen  and pelvis that was performed on 04/24/2019.  This showed atherosclerotic disease of the infrarenal aorta but no focal stenosis or aneurysm.  There was also a stable eccentric ulceration in the right common iliac artery.  There was no obvious explanation for his abdominal pain based on this study.  He did have cholelithiasis with but no cholecystitis.  He had bilateral renal cysts.  He also had multilevel degenerative changes in his lumbar spine.

## 2019-05-02 ENCOUNTER — Ambulatory Visit (HOSPITAL_COMMUNITY): Payer: Medicare HMO

## 2019-05-02 ENCOUNTER — Ambulatory Visit: Payer: Medicare HMO | Admitting: Vascular Surgery

## 2019-05-17 DIAGNOSIS — Z0100 Encounter for examination of eyes and vision without abnormal findings: Secondary | ICD-10-CM | POA: Diagnosis not present

## 2019-05-20 DIAGNOSIS — E038 Other specified hypothyroidism: Secondary | ICD-10-CM | POA: Diagnosis not present

## 2019-05-20 DIAGNOSIS — Z23 Encounter for immunization: Secondary | ICD-10-CM | POA: Diagnosis not present

## 2019-05-20 DIAGNOSIS — E7849 Other hyperlipidemia: Secondary | ICD-10-CM | POA: Diagnosis not present

## 2019-05-20 DIAGNOSIS — Z125 Encounter for screening for malignant neoplasm of prostate: Secondary | ICD-10-CM | POA: Diagnosis not present

## 2019-05-23 DIAGNOSIS — R82998 Other abnormal findings in urine: Secondary | ICD-10-CM | POA: Diagnosis not present

## 2019-05-27 DIAGNOSIS — R7401 Elevation of levels of liver transaminase levels: Secondary | ICD-10-CM | POA: Diagnosis not present

## 2019-05-27 DIAGNOSIS — I480 Paroxysmal atrial fibrillation: Secondary | ICD-10-CM | POA: Diagnosis not present

## 2019-05-27 DIAGNOSIS — E038 Other specified hypothyroidism: Secondary | ICD-10-CM | POA: Diagnosis not present

## 2019-05-27 DIAGNOSIS — I10 Essential (primary) hypertension: Secondary | ICD-10-CM | POA: Diagnosis not present

## 2019-05-27 DIAGNOSIS — Z1331 Encounter for screening for depression: Secondary | ICD-10-CM | POA: Diagnosis not present

## 2019-05-27 DIAGNOSIS — E785 Hyperlipidemia, unspecified: Secondary | ICD-10-CM | POA: Diagnosis not present

## 2019-05-27 DIAGNOSIS — R1031 Right lower quadrant pain: Secondary | ICD-10-CM | POA: Diagnosis not present

## 2019-05-27 DIAGNOSIS — Z0000 Encounter for general adult medical examination without abnormal findings: Secondary | ICD-10-CM | POA: Diagnosis not present

## 2019-05-29 DIAGNOSIS — Z1212 Encounter for screening for malignant neoplasm of rectum: Secondary | ICD-10-CM | POA: Diagnosis not present

## 2019-07-10 NOTE — Progress Notes (Signed)
HPI: Follow-up history of mitral valve repair secondary to mitral valve prolapse mitral regurgitation, paroxysmal atrial fibrillation, hypertension and hyperlipidemia. Previously followed by Dr. Acie Fredrickson. Patient had mitral valve repair in 2012. Note preoperative cardiac catheterization March 2012 showed no coronary disease. Echocardiogram February 2019 showed normal LV function, prior mitral valve repair with "trace mitral stenosis" and mild tricuspid regurgitation. CTA November 2020 showed aortic atherosclerosis but no aneurysm.  Patient doing well symptomatically.  He denies dyspnea on exertion, orthopnea, PND, pedal edema or exertional chest pain.  No syncope.  He does have occasional brief palpitations for several seconds.  Current Outpatient Medications  Medication Sig Dispense Refill  . amLODipine (NORVASC) 5 MG tablet Take 5 mg by mouth daily.     Marland Kitchen amoxicillin (AMOXIL) 500 MG capsule Take 2,000 mg by mouth See admin instructions. Only takes when has dental work done    . aspirin 81 MG tablet Take 81 mg by mouth daily.      . Coenzyme Q10 (CO Q 10) 100 MG CAPS Take 1 tablet by mouth every morning.     Marland Kitchen ibuprofen (ADVIL,MOTRIN) 200 MG tablet Take 200 mg by mouth every 6 (six) hours as needed for moderate pain.     Marland Kitchen levothyroxine (SYNTHROID, LEVOTHROID) 100 MCG tablet Take 100 mcg by mouth daily.    Marland Kitchen losartan (COZAAR) 50 MG tablet Take 50 mg by mouth 2 (two) times daily.    . LUTEIN-ZEAXANTHIN PO Take 20 mg by mouth daily.     . metoprolol (TOPROL-XL) 100 MG 24 hr tablet Take 100 mg by mouth daily.      . pravastatin (PRAVACHOL) 40 MG tablet Take by mouth.    . Simethicone 125 MG TABS Take 1 tablet by mouth daily as needed (gas).     . traMADol (ULTRAM) 50 MG tablet Take 1 tablet (50 mg total) by mouth every 12 (twelve) hours as needed. 6 tablet 0  . triamcinolone cream (KENALOG) 0.1 % Apply 1 application topically 2 (two) times daily.      No current facility-administered  medications for this visit.     Past Medical History:  Diagnosis Date  . Arthritis   . CHF (congestive heart failure) (HCC) CHRONIC SYSTOLIC WITH ACUTE EXACERBATIONS  . GERD (gastroesophageal reflux disease)   . Heart murmur   . Hyperlipemia   . Hypertension   . MVP (mitral valve prolapse) WITH SEVERE MITRAL REGURG  . Nephrolithiasis   . Palpitations WITH PREMATURE VENTRICULAR COTRACTIONS AND PREMATURE ATRIAL CONTRACTIONS  . Rheumatic fever REPORTED HISTORY  . Sensorineural hearing loss   . Varicose veins   . Weakness of right upper extremity RELATED TO RUPTURED BICEPS TENDON    Past Surgical History:  Procedure Laterality Date  . HEMORRHOID SURGERY    . HERNIA REPAIR    . HYDROCELE EXCISION / REPAIR Left   . INGUINAL HERNIA REPAIR Right    Dr. Deon Pilling  . MITRAL VALVE REPAIR  08/19/2010   Dr. Ricard Dillon.  complex valvuloplasty with 53mm ring annuloplasty via right mini thoracotomy (Dr. Roxy Manns)  . Multiple surgical procedures in his use for    . ROTATOR CUFF REPAIR    . The patient has also had    . TONSILLECTOMY    . URETER ECTOPIC RESECTION Left     Social History   Socioeconomic History  . Marital status: Married    Spouse name: Not on file  . Number of children: 0  . Years of education:  Not on file  . Highest education level: Not on file  Occupational History  . Not on file  Tobacco Use  . Smoking status: Former Smoker    Types: Cigarettes    Quit date: 08/12/1994    Years since quitting: 24.9  . Smokeless tobacco: Never Used  Substance and Sexual Activity  . Alcohol use: No  . Drug use: No  . Sexual activity: Not on file  Other Topics Concern  . Not on file  Social History Narrative  . Not on file   Social Determinants of Health   Financial Resource Strain:   . Difficulty of Paying Living Expenses: Not on file  Food Insecurity:   . Worried About Charity fundraiser in the Last Year: Not on file  . Ran Out of Food in the Last Year: Not on file   Transportation Needs:   . Lack of Transportation (Medical): Not on file  . Lack of Transportation (Non-Medical): Not on file  Physical Activity:   . Days of Exercise per Week: Not on file  . Minutes of Exercise per Session: Not on file  Stress:   . Feeling of Stress : Not on file  Social Connections:   . Frequency of Communication with Friends and Family: Not on file  . Frequency of Social Gatherings with Friends and Family: Not on file  . Attends Religious Services: Not on file  . Active Member of Clubs or Organizations: Not on file  . Attends Archivist Meetings: Not on file  . Marital Status: Not on file  Intimate Partner Violence:   . Fear of Current or Ex-Partner: Not on file  . Emotionally Abused: Not on file  . Physically Abused: Not on file  . Sexually Abused: Not on file    Family History  Problem Relation Age of Onset  . Heart attack Mother   . Angina Mother   . Hypertension Mother   . Dementia Mother   . Seizures Father   . Hypertension Sister   . Hypertension Brother   . Colon cancer Neg Hx     ROS: no fevers or chills, productive cough, hemoptysis, dysphasia, odynophagia, melena, hematochezia, dysuria, hematuria, rash, seizure activity, orthopnea, PND, pedal edema, claudication. Remaining systems are negative.  Physical Exam: Well-developed well-nourished in no acute distress.  Skin is warm and dry.  HEENT is normal.  Neck is supple.  Chest is clear to auscultation with normal expansion.  Cardiovascular exam is regular rate and rhythm.  Abdominal exam nontender or distended. No masses palpated. Extremities show no edema. neuro grossly intact  A/P  1 prior mitral valve repair secondary to mitral valve prolapse/mitral regurgitation-patient doing well with no dyspnea.  Continue SBE prophylaxis.  2 palpitations-symptoms reasonably well controlled.  Continue Toprol at present dose.  3 hypertension-blood pressure controlled.  Continue present  medications.  4 hyperlipidemia-recent laboratories from December 2020 reviewed.  Total cholesterol 184 with LDL 105.  We discussed options today.  He did not tolerate Crestor or Lipitor due to elevated liver functions in the past.  He had success with Zocor.  We discussed changing pravastatin to Zocor and also the addition of Zetia.  I also mentioned Repatha and Praluent.  He will consider these options and contact us.  5 aortic atherosclerosis-continue aspirin and statin.  Would like for LDL to be less than 70 ideally.  [name], MD

## 2019-07-16 ENCOUNTER — Other Ambulatory Visit: Payer: Self-pay

## 2019-07-16 ENCOUNTER — Ambulatory Visit: Payer: Medicare HMO | Admitting: Cardiology

## 2019-07-16 ENCOUNTER — Encounter: Payer: Self-pay | Admitting: Cardiology

## 2019-07-16 VITALS — BP 110/66 | HR 64 | Temp 93.7°F | Ht 65.0 in | Wt 172.0 lb

## 2019-07-16 DIAGNOSIS — E785 Hyperlipidemia, unspecified: Secondary | ICD-10-CM | POA: Diagnosis not present

## 2019-07-16 DIAGNOSIS — Z9889 Other specified postprocedural states: Secondary | ICD-10-CM

## 2019-07-16 DIAGNOSIS — I10 Essential (primary) hypertension: Secondary | ICD-10-CM | POA: Diagnosis not present

## 2019-07-16 NOTE — Patient Instructions (Signed)
Medication Instructions:  NO CHANGE *If you need a refill on your cardiac medications before your next appointment, please call your pharmacy*  Lab Work: If you have labs (blood work) drawn today and your tests are completely normal, you will receive your results only by: . MyChart Message (if you have MyChart) OR . A paper copy in the mail If you have any lab test that is abnormal or we need to change your treatment, we will call you to review the results.  Follow-Up: At [facility], you and your health needs are our priority.  As part of our continuing mission to provide you with exceptional heart care, we have created designated Provider Care Teams.  These Care Teams include your primary Cardiologist (physician) and Advanced Practice Providers (APPs -  Physician Assistants and Nurse Practitioners) who all work together to provide you with the care you need, when you need it.  Your next appointment:   6 month(s)  The format for your next appointment:   Either In Person or Virtual  Provider:   You may see BRIAN CRENSHAW MD or one of the following Advanced Practice Providers on your designated Care Team:    Luke Kilroy, PA-C  Callie Goodrich, PA-C  Jesse Cleaver, FNP    

## 2019-09-12 DIAGNOSIS — E7849 Other hyperlipidemia: Secondary | ICD-10-CM | POA: Diagnosis not present

## 2019-09-17 ENCOUNTER — Encounter: Payer: Self-pay | Admitting: Family Medicine

## 2019-09-17 ENCOUNTER — Other Ambulatory Visit: Payer: Self-pay

## 2019-09-17 ENCOUNTER — Ambulatory Visit: Payer: Medicare HMO | Admitting: Family Medicine

## 2019-09-17 VITALS — BP 142/74 | HR 83 | Ht 65.0 in | Wt 168.0 lb

## 2019-09-17 DIAGNOSIS — M545 Low back pain, unspecified: Secondary | ICD-10-CM

## 2019-09-17 DIAGNOSIS — G8929 Other chronic pain: Secondary | ICD-10-CM | POA: Diagnosis not present

## 2019-09-17 DIAGNOSIS — M999 Biomechanical lesion, unspecified: Secondary | ICD-10-CM | POA: Diagnosis not present

## 2019-09-17 MED ORDER — COLCHICINE 0.6 MG PO TABS: 0.6000 mg | ORAL_TABLET | Freq: Every day | ORAL | 0 refills | Status: DC

## 2019-09-17 MED ORDER — KETOROLAC TROMETHAMINE 60 MG/2ML IM SOLN
60.0000 mg | Freq: Once | INTRAMUSCULAR | Status: AC
  Administered 2019-09-17: 60 mg via INTRAMUSCULAR

## 2019-09-17 MED ORDER — METHYLPREDNISOLONE ACETATE 80 MG/ML IJ SUSP
80.0000 mg | Freq: Once | INTRAMUSCULAR | Status: AC
  Administered 2019-09-17: 80 mg via INTRAMUSCULAR

## 2019-09-17 NOTE — Progress Notes (Signed)
[place] Phone: [phone] Subjective:   [name], am serving as a scribe for Dr. Hulan Saas. This visit occurred during the SARS-CoV-2 public health emergency.  Safety protocols were in place, including screening questions prior to the visit, additional usage of staff PPE, and extensive cleaning of exam room while observing appropriate contact time as indicated for disinfecting solutions.   I'm seeing this patient by the request  of:  Scott Battles, MD  CC: Low back pain  [id number]  [name] is a 69 y.o. male coming in with complaint of hip and back pain. Last seen for OMT June 2018. Patient states that the past 2 weeks he feels everything in his body that he has ever injured is increasing in the pain level. Pain over right adductor which radiates into the abdomen. Has similar pain on left side as well. Notes a temperature of 99 the other day. Had second [place] vaccination on March 16th. Did have muscles aches 2 days later and his pain got so bad he couldn't perform a squat. Also developed pink eye following the vaccination.  Patient states that he has been significantly worse in the mornings.  Gets better in the midday and then more tightness at night again.  Rates the severity of pain is 8 out of 10.  Has had 2 other inflammatory responses in which DepoMedrol alleviated the symptoms.     Past Medical History:  Diagnosis Date  . Arthritis   . CHF (congestive heart failure) (HCC) CHRONIC SYSTOLIC WITH ACUTE EXACERBATIONS  . GERD (gastroesophageal reflux disease)   . Heart murmur   . Hyperlipemia   . Hypertension   . MVP (mitral valve prolapse) WITH SEVERE MITRAL REGURG  . Nephrolithiasis   . Palpitations WITH PREMATURE VENTRICULAR COTRACTIONS AND PREMATURE ATRIAL CONTRACTIONS  . Rheumatic fever REPORTED HISTORY  . Sensorineural hearing loss   . Varicose veins   . Weakness of right upper  extremity RELATED TO RUPTURED BICEPS TENDON   Past Surgical History:  Procedure Laterality Date  . HEMORRHOID SURGERY    . HERNIA REPAIR    . HYDROCELE EXCISION / REPAIR Left   . INGUINAL HERNIA REPAIR Right    Dr. Deon Pilling  . MITRAL VALVE REPAIR  08/19/2010   Dr. Ricard Dillon.  complex valvuloplasty with 29mm ring annuloplasty via right mini thoracotomy (Dr. Roxy Manns)  . Multiple surgical procedures in his use for    . ROTATOR CUFF REPAIR    . The patient has also had    . TONSILLECTOMY    . URETER ECTOPIC RESECTION Left    Social History   Socioeconomic History  . Marital status: Married    Spouse name: Not on file  . Number of children: 0  . Years of education: Not on file  . Highest education level: Not on file  Occupational History  . Not on file  Tobacco Use  . Smoking status: Former Smoker    Types: Cigarettes    Quit date: 08/12/1994    Years since quitting: 25.1  . Smokeless tobacco: Never Used  Substance and Sexual Activity  . Alcohol use: No  . Drug use: No  . Sexual activity: Not on file  Other Topics Concern  . Not on file  Social History Narrative  . Not on file   Social Determinants of Health   Financial Resource Strain:   . Difficulty of Paying Living Expenses:   Food Insecurity:   .  Worried About Charity fundraiser in the Last Year:   . Arboriculturist in the Last Year:   Transportation Needs:   . Film/video editor (Medical):   Marland Kitchen Lack of Transportation (Non-Medical):   Physical Activity:   . Days of Exercise per Week:   . Minutes of Exercise per Session:   Stress:   . Feeling of Stress :   Social Connections:   . Frequency of Communication with Friends and Family:   . Frequency of Social Gatherings with Friends and Family:   . Attends Religious Services:   . Active Member of Clubs or Organizations:   . Attends Archivist Meetings:   Marland Kitchen Marital Status:    Allergies  Allergen Reactions  . Atenolol Other (See Comments)    Stated doctor  told him he was allergic. Stated doctor told him he was allergic. Unsure of reaction  . Nadolol Other (See Comments)    Stated doctor told him he was allergic   . Statins Other (See Comments)    Increase liver enzymes   Family History  Problem Relation Age of Onset  . Heart attack Mother   . Angina Mother   . Hypertension Mother   . Dementia Mother   . Seizures Father   . Hypertension Sister   . Hypertension Brother   . Colon cancer Neg Hx     Current Outpatient Medications (Endocrine & Metabolic):  .  levothyroxine (SYNTHROID, LEVOTHROID) 100 MCG tablet, Take 100 mcg by mouth daily.  Current Outpatient Medications (Cardiovascular):  .  amLODipine (NORVASC) 5 MG tablet, Take 5 mg by mouth daily.  Marland Kitchen  losartan (COZAAR) 50 MG tablet, Take 50 mg by mouth 2 (two) times daily. .  metoprolol (TOPROL-XL) 100 MG 24 hr tablet, Take 100 mg by mouth daily.   .  pravastatin (PRAVACHOL) 40 MG tablet, Take by mouth.   Current Outpatient Medications (Analgesics):  .  aspirin 81 MG tablet, Take 81 mg by mouth daily.   Marland Kitchen  ibuprofen (ADVIL,MOTRIN) 200 MG tablet, Take 200 mg by mouth every 6 (six) hours as needed for moderate pain.  .  traMADol (ULTRAM) 50 MG tablet, Take 1 tablet (50 mg total) by mouth every 12 (twelve) hours as needed. .  colchicine 0.6 MG tablet, Take 1 tablet (0.6 mg total) by mouth daily.   Current Outpatient Medications (Other):  .  amoxicillin (AMOXIL) 500 MG capsule, Take 2,000 mg by mouth See admin instructions. Only takes when has dental work done .  Coenzyme Q10 (CO Q 10) 100 MG CAPS, Take 1 tablet by mouth every morning.  .  LUTEIN-ZEAXANTHIN PO*, Take 20 mg by mouth daily.  .  Simethicone 125 MG TABS, Take 1 tablet by mouth daily as needed (gas).  .  triamcinolone cream (KENALOG) 0.1 %, Apply 1 application topically 2 (two) times daily.  * These medications belong to multiple therapeutic classes and are listed under each applicable group.   Reviewed prior  external information including notes and imaging from  primary care provider As well as notes that were available from care everywhere and other healthcare systems.  Past medical history, social, surgical and family history all reviewed in electronic medical record.  No pertanent information unless stated regarding to the chief complaint.   Review of Systems:  No headache, visual changes, nausea, vomiting, diarrhea, constipation, dizziness, abdominal pain, skin rash, fevers, chills, night sweats, weight loss, swollen lymph nodes, body aches, joint swelling, chest pain, shortness  of breath, mood changes. POSITIVE muscle aches  Objective  Blood pressure (!) 142/74, pulse 83, height 5\' 5"  (1.651 m), weight 168 lb (76.2 kg), SpO2 99 %.   General: No apparent distress alert and oriented x3 mood and affect normal, dressed appropriately.  HEENT: Pupils equal, extraocular movements intact  Respiratory: Patient's speak in full sentences and does not appear short of breath  Cardiovascular: No lower extremity edema, non tender, no erythema  Neuro: Cranial nerves II through XII are intact, neurovascularly intact in all extremities with 2+ DTRs and 2+ pulses.  Gait normal with good balance and coordination.  MSK: Arthritic changes of multiple joints Patient's low back does have significant loss of lordosis.  Tightness with straight leg test and mild tightness with Corky Sox test.  Patient does have pain in the L4-S1 paraspinal musculature of the lumbar spine bilaterally.  Tightness in the pelvic area as well.  Tenderness over the pubic symphysis that is quite severe.  Osteopathic findings T6 extended rotated and side bent left L1 flexed rotated and side bent right L5 flexed rotated and side bent left Sacrum right on right    Impression and Recommendations:     This case required medical decision making of moderate complexity. The above documentation has been reviewed and is accurate and complete  Lyndal Pulley, DO       Note: This dictation was prepared with Dragon dictation along with smaller phrase technology. Any transcriptional errors that result from this process are unintentional.

## 2019-09-17 NOTE — Patient Instructions (Addendum)
CoQ10 400mg  daily for the next 2 weeks Pennsaid 2x daily as needed 2 injections today Exercise 3 times a week Colchicine daily for 5 days  Follow up in 5 weeks If not better Monday and I will order labs

## 2019-09-17 NOTE — Assessment & Plan Note (Signed)
Continued chronic pain with known degenerative disc disease.  I do think the patient is having a severe exacerbation.  Pelvic pain though is somewhat concerning for potential osteitis pubis.  Short course of colchicine given.  Patient does complain mostly of muscle pain and we discussed over-the-counter medications.  Toradol and Depo-Medrol given today and attempted osteopathic manipulation.  No swelling of the lower extremities are not concerned about the heart but if continuing to have pain I do feel laboratory work-up would be beneficial.  No fevers chills or any abnormal weight loss recently.  Follow-up again in 3 to 6 weeks

## 2019-09-17 NOTE — Assessment & Plan Note (Signed)

## 2019-09-23 ENCOUNTER — Ambulatory Visit: Payer: Medicare HMO | Admitting: Family Medicine

## 2019-09-26 ENCOUNTER — Other Ambulatory Visit: Payer: Self-pay

## 2019-09-26 ENCOUNTER — Other Ambulatory Visit (INDEPENDENT_AMBULATORY_CARE_PROVIDER_SITE_OTHER): Payer: Medicare HMO

## 2019-09-26 ENCOUNTER — Telehealth: Payer: Self-pay | Admitting: *Deleted

## 2019-09-26 DIAGNOSIS — M2550 Pain in unspecified joint: Secondary | ICD-10-CM

## 2019-09-26 LAB — COMPREHENSIVE METABOLIC PANEL
ALT: 59 U/L — ABNORMAL HIGH (ref 0–53)
AST: 36 U/L (ref 0–37)
Albumin: 4.1 g/dL (ref 3.5–5.2)
Alkaline Phosphatase: 117 U/L (ref 39–117)
BUN: 24 mg/dL — ABNORMAL HIGH (ref 6–23)
CO2: 28 mEq/L (ref 19–32)
Calcium: 9.7 mg/dL (ref 8.4–10.5)
Chloride: 99 mEq/L (ref 96–112)
Creatinine, Ser: 1.02 mg/dL (ref 0.40–1.50)
GFR: 72.34 mL/min (ref >60.00)
Glucose, Bld: 90 mg/dL (ref 70–99)
Potassium: 4.5 mEq/L (ref 3.5–5.1)
Sodium: 134 mEq/L — ABNORMAL LOW (ref 135–145)
Total Bilirubin: 0.7 mg/dL (ref 0.2–1.2)
Total Protein: 7.6 g/dL (ref 6.0–8.3)

## 2019-09-26 LAB — CBC WITH DIFFERENTIAL/PLATELET
Basophils Absolute: 0.1 10*3/uL (ref 0.0–0.1)
Basophils Relative: 0.5 % (ref 0.0–3.0)
Eosinophils Absolute: 0.1 10*3/uL (ref 0.0–0.7)
Eosinophils Relative: 0.6 % (ref 0.0–5.0)
HCT: 36.9 % — ABNORMAL LOW (ref 39.0–52.0)
Hemoglobin: 12.4 g/dL — ABNORMAL LOW (ref 13.0–17.0)
Lymphocytes Relative: 12.6 % (ref 12.0–46.0)
Lymphs Abs: 1.2 10*3/uL (ref 0.7–4.0)
MCHC: 33.7 g/dL (ref 30.0–36.0)
MCV: 89.2 fl (ref 78.0–100.0)
Monocytes Absolute: 1 10*3/uL (ref 0.1–1.0)
Monocytes Relative: 10.2 % (ref 3.0–12.0)
Neutro Abs: 7.4 10*3/uL (ref 1.4–7.7)
Neutrophils Relative %: 76.1 % (ref 43.0–77.0)
Platelets: 337 10*3/uL (ref 150.0–400.0)
RBC: 4.13 Mil/uL — ABNORMAL LOW (ref 4.22–5.81)
RDW: 13.6 % (ref 11.5–15.5)
WBC: 9.8 10*3/uL (ref 4.0–10.5)

## 2019-09-26 LAB — TESTOSTERONE: Testosterone: 188.09 ng/dL — ABNORMAL LOW (ref 300.00–890.00)

## 2019-09-26 LAB — FERRITIN: Ferritin: 101.2 ng/mL (ref 22.0–322.0)

## 2019-09-26 LAB — SEDIMENTATION RATE: Sed Rate: 32 mm/hr — ABNORMAL HIGH (ref 0–20)

## 2019-09-26 LAB — TSH: TSH: 4.75 u[IU]/mL — ABNORMAL HIGH (ref 0.35–4.50)

## 2019-09-26 LAB — URIC ACID: Uric Acid, Serum: 5.6 mg/dL (ref 4.0–7.8)

## 2019-09-26 LAB — C-REACTIVE PROTEIN: CRP: 7.8 mg/dL (ref 0.5–20.0)

## 2019-09-26 LAB — VITAMIN D 25 HYDROXY (VIT D DEFICIENCY, FRACTURES): VITD: 31.94 ng/mL (ref 30.00–100.00)

## 2019-09-26 LAB — IBC PANEL
Iron: 38 ug/dL — ABNORMAL LOW (ref 42–165)
Saturation Ratios: 12.5 % — ABNORMAL LOW (ref 20.0–50.0)
Transferrin: 217 mg/dL (ref 212.0–360.0)

## 2019-09-26 NOTE — Telephone Encounter (Signed)
Pt called stating he was feeling better over the weekend but now the pain is back. Pt stated that when he saw Dr. Tamala Julian last week he mentioned getting some labs. Does the pt need to come have labs drawn?

## 2019-09-26 NOTE — Telephone Encounter (Signed)
Spoke with patient that labs are in and he can come by today to get them drawn.

## 2019-09-27 ENCOUNTER — Telehealth: Payer: Self-pay | Admitting: Cardiology

## 2019-09-27 ENCOUNTER — Other Ambulatory Visit: Payer: Self-pay

## 2019-09-27 MED ORDER — LEVOTHYROXINE SODIUM 125 MCG PO TABS: 125.0000 ug | ORAL_TABLET | Freq: Every day | ORAL | 6 refills | Status: DC

## 2019-09-27 NOTE — Telephone Encounter (Signed)
Spoke with patient in regards to labs and OTC recommendations for iron.

## 2019-09-27 NOTE — Telephone Encounter (Addendum)
Patient called regarding his lab results. He is very concerned with his sedimentation rate and other low levels. He asked if these labs could be faxed over to his primary care physician (Dr Philip Aspen) and also if Dr Tamala Julian could address those levels.  Dr Philip Aspen - Fax: [phone]  ** If it's okay with you, I can fax it.

## 2019-09-27 NOTE — Telephone Encounter (Signed)
Yes please fax

## 2019-09-27 NOTE — Telephone Encounter (Signed)
Pt had some inflammation and because his PCP is on vacation, he went to Dr. Hulan Saas at  [facility]. While there he had a full panel of labs done. The results that the patient is most concerned about is the Sed Rate. He would like Dr. [name] to interpret those results. He made an appt to see Dr. [name] on Monday

## 2019-09-27 NOTE — Telephone Encounter (Signed)
Patient would like for Dr. [name] to review his most recent lab results. He has an appointment on Monday and would like to discuss the results with him then.

## 2019-09-29 ENCOUNTER — Encounter (HOSPITAL_COMMUNITY): Payer: Self-pay | Admitting: Emergency Medicine

## 2019-09-29 ENCOUNTER — Emergency Department (HOSPITAL_COMMUNITY)
Admission: EM | Admit: 2019-09-29 | Discharge: 2019-09-29 | Disposition: A | Discharge status: Home / Self Care | Payer: Medicare HMO | Attending: Emergency Medicine | Admitting: Emergency Medicine

## 2019-09-29 ENCOUNTER — Emergency Department (HOSPITAL_COMMUNITY): Payer: Medicare HMO

## 2019-09-29 ENCOUNTER — Other Ambulatory Visit: Payer: Self-pay

## 2019-09-29 DIAGNOSIS — I213 ST elevation (STEMI) myocardial infarction of unspecified site: Secondary | ICD-10-CM | POA: Diagnosis not present

## 2019-09-29 DIAGNOSIS — R0602 Shortness of breath: Secondary | ICD-10-CM | POA: Diagnosis not present

## 2019-09-29 DIAGNOSIS — R000 Tachycardia, unspecified: Secondary | ICD-10-CM | POA: Insufficient documentation

## 2019-09-29 DIAGNOSIS — R531 Weakness: Secondary | ICD-10-CM | POA: Diagnosis not present

## 2019-09-29 DIAGNOSIS — I10 Essential (primary) hypertension: Secondary | ICD-10-CM | POA: Diagnosis not present

## 2019-09-29 DIAGNOSIS — Z5321 Procedure and treatment not carried out due to patient leaving prior to being seen by health care provider: Secondary | ICD-10-CM | POA: Diagnosis not present

## 2019-09-29 DIAGNOSIS — R0902 Hypoxemia: Secondary | ICD-10-CM | POA: Diagnosis not present

## 2019-09-29 HISTORY — DX: Unspecified atrial fibrillation: I48.91

## 2019-09-29 LAB — BASIC METABOLIC PANEL
Anion gap: 10 (ref 5–15)
BUN: 19 mg/dL (ref 8–23)
CO2: 27 mmol/L (ref 22–32)
Calcium: 9.1 mg/dL (ref 8.9–10.3)
Chloride: 101 mmol/L (ref 98–111)
Creatinine, Ser: 1.13 mg/dL (ref 0.61–1.24)
GFR calc Af Amer: >60 mL/min (ref >60)
GFR calc non Af Amer: >60 mL/min (ref >60)
Glucose, Bld: 119 mg/dL — ABNORMAL HIGH (ref 70–99)
Potassium: 4.1 mmol/L (ref 3.5–5.1)
Sodium: 138 mmol/L (ref 135–145)

## 2019-09-29 LAB — CBC
HCT: 37.6 % — ABNORMAL LOW (ref 39.0–52.0)
Hemoglobin: 11.9 g/dL — ABNORMAL LOW (ref 13.0–17.0)
MCH: 29.5 pg (ref 26.0–34.0)
MCHC: 31.6 g/dL (ref 30.0–36.0)
MCV: 93.1 fL (ref 80.0–100.0)
Platelets: 359 10*3/uL (ref 150–400)
RBC: 4.04 MIL/uL — ABNORMAL LOW (ref 4.22–5.81)
RDW: 13.1 % (ref 11.5–15.5)
WBC: 8.8 10*3/uL (ref 4.0–10.5)
nRBC: 0 % (ref 0.0–0.2)

## 2019-09-29 LAB — TROPONIN I (HIGH SENSITIVITY): Troponin I (High Sensitivity): 6 ng/L (ref <18)

## 2019-09-29 MED ORDER — SODIUM CHLORIDE 0.9% FLUSH: 3.0000 mL | Freq: Once | INTRAVENOUS | Status: DC

## 2019-09-29 NOTE — ED Notes (Signed)
Pt states he wants to go home. Writer recommended to patient that he stay, however, pt states he is feeling better and wold like to leave. IV removed and pt marked LWBS.

## 2019-09-29 NOTE — ED Notes (Signed)
Pt did not answer in the lobby for vitals signs recheck

## 2019-09-29 NOTE — ED Triage Notes (Addendum)
Pt to triage via [facility] from home.  Reports rapid HR that lasted approx 15 min.  Fire arrived and reports HR 140.  By the time EMS got pt on monitor he converted to HR 80 with vagal maneuvers.  Pt reports SOB during episode of tachycardia.  Denies chest pain.

## 2019-09-30 ENCOUNTER — Ambulatory Visit: Payer: Medicare HMO | Admitting: Cardiology

## 2019-09-30 ENCOUNTER — Encounter: Payer: Self-pay | Admitting: Cardiology

## 2019-09-30 VITALS — BP 126/72 | Temp 97.4°F | Resp 20 | Ht 67.0 in | Wt 165.8 lb

## 2019-09-30 DIAGNOSIS — E785 Hyperlipidemia, unspecified: Secondary | ICD-10-CM

## 2019-09-30 DIAGNOSIS — Z9889 Other specified postprocedural states: Secondary | ICD-10-CM | POA: Diagnosis not present

## 2019-09-30 DIAGNOSIS — I10 Essential (primary) hypertension: Secondary | ICD-10-CM

## 2019-09-30 LAB — PROTEIN ELECTROPHORESIS, SERUM
Albumin ELP: 3.7 g/dL — ABNORMAL LOW (ref 3.8–4.8)
Alpha 1: 0.5 g/dL — ABNORMAL HIGH (ref 0.2–0.3)
Alpha 2: 0.9 g/dL (ref 0.5–0.9)
Beta 2: 0.4 g/dL (ref 0.2–0.5)
Beta Globulin: 0.5 g/dL (ref 0.4–0.6)
Gamma Globulin: 1.1 g/dL (ref 0.8–1.7)
Total Protein: 7.1 g/dL (ref 6.1–8.1)

## 2019-09-30 LAB — ANTI-NUCLEAR AB-TITER (ANA TITER): ANA Titer 1: 1:40 {titer} — ABNORMAL HIGH

## 2019-09-30 LAB — ANA: Anti Nuclear Antibody (ANA): POSITIVE — AB

## 2019-09-30 LAB — PTH, INTACT AND CALCIUM
Calcium: 9.5 mg/dL (ref 8.6–10.3)
PTH: 27 pg/mL (ref 14–64)

## 2019-09-30 LAB — ANGIOTENSIN CONVERTING ENZYME: Angiotensin-Converting Enzyme: 29 U/L (ref 9–67)

## 2019-09-30 LAB — RHEUMATOID FACTOR: Rheumatoid fact SerPl-aCnc: <14 IU/mL (ref <14)

## 2019-09-30 LAB — CALCIUM, IONIZED: Calcium, Ion: 5.1 mg/dL (ref 4.8–5.6)

## 2019-09-30 LAB — CYCLIC CITRUL PEPTIDE ANTIBODY, IGG: Cyclic Citrullin Peptide Ab: <16 UNITS

## 2019-09-30 NOTE — Progress Notes (Signed)
HPI: Follow-up history of mitral valve repair secondary to mitral valve prolapse mitral regurgitation, paroxysmal atrial fibrillation, hypertension and hyperlipidemia. Previously followed by Dr. Acie Fredrickson. Patient had mitral valve repair in 2012. Note preoperative cardiac catheterization March 2012 showed no coronary disease. Echocardiogram February 2019 showed normal LV function, prior mitral valve repair with "trace mitral stenosis" and mild tricuspid regurgitation. CTA November 2020 showed aortic atherosclerosis but no aneurysm.   Patient developed palpitations yesterday. 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  However he converted and went home.  Troponin normal, hemoglobin 11.9, chest x-ray without active disease.  Potassium 4.1.  Since last seen patient has multiple complaints.  He complains of diffuse myalgias and arthralgias.  He complains of general fatigue.  He complains of lower abdominal pain.  He denies chest pain or dyspnea.  He did have palpitations with his elevated heart rate.  Current Outpatient Medications  Medication Sig Dispense Refill  . amLODipine (NORVASC) 5 MG tablet Take 5 mg by mouth daily.     Marland Kitchen amoxicillin (AMOXIL) 500 MG capsule Take 2,000 mg by mouth See admin instructions. Only takes when has dental work done    . aspirin 81 MG tablet Take 81 mg by mouth daily.      . Coenzyme Q10 (CO Q 10) 100 MG CAPS Take 1 tablet by mouth every morning.     . colchicine 0.6 MG tablet Take 1 tablet (0.6 mg total) by mouth daily. 5 tablet 0  . ibuprofen (ADVIL,MOTRIN) 200 MG tablet Take 200 mg by mouth every 6 (six) hours as needed for moderate pain.     Marland Kitchen levothyroxine (SYNTHROID) 125 MCG tablet Take 1 tablet (125 mcg total) by mouth daily. 30 tablet 6  . losartan (COZAAR) 50 MG tablet Take 50 mg by mouth 2 (two) times daily.    . LUTEIN-ZEAXANTHIN PO Take 20 mg by mouth daily.     . metoprolol (TOPROL-XL) 100 MG 24 hr tablet Take 100 mg by mouth daily.      .  pravastatin (PRAVACHOL) 40 MG tablet Take by mouth.    . Simethicone 125 MG TABS Take 1 tablet by mouth daily as needed (gas).     . simvastatin (ZOCOR) 20 MG tablet     . traMADol (ULTRAM) 50 MG tablet Take 1 tablet (50 mg total) by mouth every 12 (twelve) hours as needed. 6 tablet 0  . triamcinolone cream (KENALOG) 0.1 % Apply 1 application topically 2 (two) times daily.      No current facility-administered medications for this visit.     Past Medical History:  Diagnosis Date  . Arthritis   . Atrial fibrillation ([place])   . CHF (congestive heart failure) (HCC) CHRONIC SYSTOLIC WITH ACUTE EXACERBATIONS  . GERD (gastroesophageal reflux disease)   . Heart murmur   . Hyperlipemia   . Hypertension   . MVP (mitral valve prolapse) WITH SEVERE MITRAL REGURG  . Nephrolithiasis   . Palpitations WITH PREMATURE VENTRICULAR COTRACTIONS AND PREMATURE ATRIAL CONTRACTIONS  . Rheumatic fever REPORTED HISTORY  . Sensorineural hearing loss   . Varicose veins   . Weakness of right upper extremity RELATED TO RUPTURED BICEPS TENDON    Past Surgical History:  Procedure Laterality Date  . HEMORRHOID SURGERY    . HERNIA REPAIR    . HYDROCELE EXCISION / REPAIR Left   . INGUINAL HERNIA REPAIR Right    Dr. Deon Pilling  . MITRAL VALVE REPAIR  08/19/2010   Dr. Ricard Dillon.  complex valvuloplasty with 84mm ring annuloplasty via right mini thoracotomy (Dr. Roxy Manns)  . Multiple surgical procedures in his use for    . ROTATOR CUFF REPAIR    . The patient has also had    . TONSILLECTOMY    . URETER ECTOPIC RESECTION Left     Social History   Socioeconomic History  . Marital status: Married    Spouse name: Not on file  . Number of children: 0  . Years of education: Not on file  . Highest education level: Not on file  Occupational History  . Not on file  Tobacco Use  . Smoking status: Former Smoker    Types: Cigarettes    Quit date: 08/12/1994    Years since quitting: 25.1  . Smokeless tobacco: Never Used   Substance and Sexual Activity  . Alcohol use: No  . Drug use: No  . Sexual activity: Not on file  Other Topics Concern  . Not on file  Social History Narrative  . Not on file   Social Determinants of Health   Financial Resource Strain:   . Difficulty of Paying Living Expenses:   Food Insecurity:   . Worried About Charity fundraiser in the Last Year:   . Arboriculturist in the Last Year:   Transportation Needs:   . Film/video editor (Medical):   Marland Kitchen Lack of Transportation (Non-Medical):   Physical Activity:   . Days of Exercise per Week:   . Minutes of Exercise per Session:   Stress:   . Feeling of Stress :   Social Connections:   . Frequency of Communication with Friends and Family:   . Frequency of Social Gatherings with Friends and Family:   . Attends Religious Services:   . Active Member of Clubs or Organizations:   . Attends Archivist Meetings:   Marland Kitchen Marital Status:   Intimate Partner Violence:   . Fear of Current or Ex-Partner:   . Emotionally Abused:   Marland Kitchen Physically Abused:   . Sexually Abused:     Family History  Problem Relation Age of Onset  . Heart attack Mother   . Angina Mother   . Hypertension Mother   . Dementia Mother   . Seizures Father   . Hypertension Sister   . Hypertension Brother   . Colon cancer Neg Hx     ROS: no fevers or chills, productive cough, hemoptysis, dysphasia, odynophagia, melena, hematochezia, dysuria, hematuria, rash, seizure activity, orthopnea, PND, pedal edema, claudication. Remaining systems are negative.  Physical Exam: Well-developed well-nourished in no acute distress.  Skin is warm and dry.  HEENT is normal.  Neck is supple.  Chest is clear to auscultation with normal expansion.  Cardiovascular exam is regular rate and rhythm.  Abdominal exam nontender or distended. No masses palpated. Extremities show no edema. neuro grossly intact  ECG-September 29, 2019-sinus rhythm with probable early  repolarization abnormality.  Personally reviewed  A/P  1 recent episode of palpitations-I have no documentation of rhythm disturbance.  If he has more frequent symptoms in the future we will consider a monitor.  2 prior mitral valve repair-patient denies dyspnea.  Continue SBE prophylaxis.  3 hypertension-blood pressure controlled.  Continue present medical regimen.  4 hyperlipidemia-patient is describing myalgias and arthralgias.  Question contribution from statin.  We will discontinue all statins and follow.  If he does not improve we will resume in the future.  If he does improve he will likely need  Repatha.  5 history of aortic atherosclerosis-continue aspirin and statin.  [name], MD

## 2019-09-30 NOTE — Patient Instructions (Signed)

## 2019-10-02 DIAGNOSIS — I480 Paroxysmal atrial fibrillation: Secondary | ICD-10-CM | POA: Diagnosis not present

## 2019-10-02 DIAGNOSIS — M2550 Pain in unspecified joint: Secondary | ICD-10-CM | POA: Diagnosis not present

## 2019-10-02 DIAGNOSIS — E291 Testicular hypofunction: Secondary | ICD-10-CM | POA: Diagnosis not present

## 2019-10-02 DIAGNOSIS — E038 Other specified hypothyroidism: Secondary | ICD-10-CM | POA: Diagnosis not present

## 2019-10-02 DIAGNOSIS — E785 Hyperlipidemia, unspecified: Secondary | ICD-10-CM | POA: Diagnosis not present

## 2019-10-02 DIAGNOSIS — D649 Anemia, unspecified: Secondary | ICD-10-CM | POA: Diagnosis not present

## 2019-10-08 DIAGNOSIS — E038 Other specified hypothyroidism: Secondary | ICD-10-CM | POA: Diagnosis not present

## 2019-10-08 DIAGNOSIS — R7989 Other specified abnormal findings of blood chemistry: Secondary | ICD-10-CM | POA: Diagnosis not present

## 2019-10-08 DIAGNOSIS — M2550 Pain in unspecified joint: Secondary | ICD-10-CM | POA: Diagnosis not present

## 2019-10-08 DIAGNOSIS — E291 Testicular hypofunction: Secondary | ICD-10-CM | POA: Diagnosis not present

## 2019-10-08 DIAGNOSIS — R1031 Right lower quadrant pain: Secondary | ICD-10-CM | POA: Diagnosis not present

## 2019-10-08 DIAGNOSIS — E785 Hyperlipidemia, unspecified: Secondary | ICD-10-CM | POA: Diagnosis not present

## 2019-10-22 DIAGNOSIS — K625 Hemorrhage of anus and rectum: Secondary | ICD-10-CM | POA: Diagnosis not present

## 2019-10-22 DIAGNOSIS — D509 Iron deficiency anemia, unspecified: Secondary | ICD-10-CM | POA: Diagnosis not present

## 2019-10-24 ENCOUNTER — Other Ambulatory Visit: Payer: Self-pay

## 2019-10-24 ENCOUNTER — Ambulatory Visit: Payer: Medicare HMO | Admitting: Family Medicine

## 2019-10-24 VITALS — BP 120/82 | HR 68 | Ht 67.0 in | Wt 163.0 lb

## 2019-10-24 DIAGNOSIS — E038 Other specified hypothyroidism: Secondary | ICD-10-CM | POA: Diagnosis not present

## 2019-10-24 DIAGNOSIS — M545 Low back pain, unspecified: Secondary | ICD-10-CM

## 2019-10-24 DIAGNOSIS — E7849 Other hyperlipidemia: Secondary | ICD-10-CM | POA: Diagnosis not present

## 2019-10-24 DIAGNOSIS — G8929 Other chronic pain: Secondary | ICD-10-CM

## 2019-10-24 DIAGNOSIS — E291 Testicular hypofunction: Secondary | ICD-10-CM | POA: Diagnosis not present

## 2019-10-24 DIAGNOSIS — D649 Anemia, unspecified: Secondary | ICD-10-CM | POA: Diagnosis not present

## 2019-10-24 MED ORDER — TRAMADOL HCL 50 MG PO TABS: 50.0000 mg | ORAL_TABLET | Freq: Two times a day (BID) | ORAL | 0 refills | Status: DC | PRN

## 2019-10-24 MED ORDER — PREDNISONE 20 MG PO TABS: ORAL_TABLET | ORAL | 0 refills | Status: DC

## 2019-10-24 NOTE — Patient Instructions (Addendum)
Tramadol sent in if you need it Lumbar and pelvis xray today at [facility] MRI of the back ordered but schedule after colonoscopy. You can cancel if we have an answer When you know MRI date follow up 2 days after that

## 2019-10-24 NOTE — Progress Notes (Signed)
[name] Phone: [phone] Subjective:   [name], am serving as a scribe for Dr. Hulan Saas. This visit occurred during the SARS-CoV-2 public health emergency.  Safety protocols were in place, including screening questions prior to the visit, additional usage of staff PPE, and extensive cleaning of exam room while observing appropriate contact time as indicated for disinfecting solutions.   I'm seeing this patient by the request  of:  Scott Battles, MD  CC: Low back pain follow-up  [id number]  [name] is a 69 y.o. male coming in with complaint of back pain. Last seen on 09/17/2019 for OMT. Patient states his back pain was better after cocktail injection last visit. Lasted one week. Had a hard time walking as the pain had increased so much in back and lower quadrant. Tried course of prednisone for 8 days which did help his pain. Pain returned after finishing medication. Was put on another prednisone course by another physician. Is currently on 20mg  for past 10 days. Does not want pain to come back after he finishes medication.   Had tachycardic event in which took him into ED since last visit. Saw cardiologist. Spoke with them about inflammation that is causing lower quadrant pain. Patient notes losing weight quickly. Five pounds down from last visit. Stopped taking statins. Had bloodwork this morning and will see his PCP next week. Is using 16mcg Synthroid. Has not begun DHEA as cardiologist didn't want him to begin at this time.   Has colonoscopy scheduled for June 3rd, 2021 to look for colon cancer. Would like rx for prednisone 10mg  until colonoscopy. Patient states that the pain seems to be fairly well resolved when he is on the prednisone but when he is off of it he is unable to even get out of the bed.  Reviewed patient's most recent labs.  Found to have a worsening anemia our lab showed a  positive ANA but low titer TSH still elevated at 4.75     Past Medical History:  Diagnosis Date  . Arthritis   . Atrial fibrillation ([place])   . CHF (congestive heart failure) ([facility]) CHRONIC SYSTOLIC WITH ACUTE EXACERBATIONS  . GERD (gastroesophageal reflux disease)   . Heart murmur   . Hyperlipemia   . Hypertension   . MVP (mitral valve prolapse) WITH SEVERE MITRAL REGURG  . Nephrolithiasis   . Palpitations WITH PREMATURE VENTRICULAR COTRACTIONS AND PREMATURE ATRIAL CONTRACTIONS  . Rheumatic fever REPORTED HISTORY  . Sensorineural hearing loss   . Varicose veins   . Weakness of right upper extremity RELATED TO RUPTURED BICEPS TENDON   Past Surgical History:  Procedure Laterality Date  . HEMORRHOID SURGERY    . HERNIA REPAIR    . HYDROCELE EXCISION / REPAIR Left   . INGUINAL HERNIA REPAIR Right    Dr. Deon [name]  . MITRAL VALVE REPAIR  08/19/2010   Dr. Ricard [name].  complex valvuloplasty with 81mm ring annuloplasty via right mini thoracotomy (Dr. Roxy [name])  . Multiple surgical procedures in his use for    . ROTATOR CUFF REPAIR    . The patient has also had    . TONSILLECTOMY    . URETER ECTOPIC RESECTION Left    Social History   Socioeconomic History  . Marital status: Married    Spouse name: Not on file  . Number of children: 0  . Years of education: Not on file  . Highest education level: Not on  file  Occupational History  . Not on file  Tobacco Use  . Smoking status: Former Smoker    Types: Cigarettes    Quit date: 08/12/1994    Years since quitting: 25.2  . Smokeless tobacco: Never Used  Substance and Sexual Activity  . Alcohol use: [name]  . Drug use: [name]  . Sexual activity: Not on file  Other Topics Concern  . Not on file  Social History Narrative  . Not on file   Social Determinants of Health   Financial Resource Strain:   . Difficulty of Paying Living Expenses:   Food Insecurity:   . Worried About Charity fundraiser in the Last Year:   . Arboriculturist in the Last  Year:   Transportation Needs:   . Film/video editor (Medical):   [name] Kitchen Lack of Transportation (Non-Medical):   Physical Activity:   . Days of Exercise per Week:   . Minutes of Exercise per Session:   Stress:   . Feeling of Stress :   Social Connections:   . Frequency of Communication with Friends and Family:   . Frequency of Social Gatherings with Friends and Family:   . Attends Religious Services:   . Active Member of Clubs or Organizations:   . Attends Archivist Meetings:   [name] Kitchen Marital Status:    Allergies  Allergen Reactions  . Atenolol Other (See Comments)    Stated doctor told him he was allergic. Stated doctor told him he was allergic. Unsure of reaction  . Nadolol Other (See Comments)    Stated doctor told him he was allergic   . Statins Other (See Comments)    Increase liver enzymes   Family History  Problem Relation Age of Onset  . Heart attack Mother   . Angina Mother   . Hypertension Mother   . Dementia Mother   . Seizures Father   . Hypertension Sister   . Hypertension Brother   . Colon cancer Neg Hx     Current Outpatient Medications (Endocrine & Metabolic):  .  levothyroxine (SYNTHROID) 125 MCG tablet, Take 1 tablet (125 mcg total) by mouth daily. .  predniSONE (DELTASONE) 20 MG tablet, Prednisone 20 mg daily for 5 days 10 mg daily for 10 days then 5 mg until bottle is out  Current Outpatient Medications (Cardiovascular):  .  amLODipine (NORVASC) 5 MG tablet, Take 5 mg by mouth daily.  [name] Kitchen  losartan (COZAAR) 50 MG tablet, Take 50 mg by mouth 2 (two) times daily. .  metoprolol (TOPROL-XL) 100 MG 24 hr tablet, Take 100 mg by mouth daily.   .  pravastatin (PRAVACHOL) 40 MG tablet, Take by mouth. .  simvastatin (ZOCOR) 20 MG tablet,    Current Outpatient Medications (Analgesics):  .  aspirin 81 MG tablet, Take 81 mg by mouth daily.   .  colchicine 0.6 MG tablet, Take 1 tablet (0.6 mg total) by mouth daily. [name] Kitchen  ibuprofen (ADVIL,MOTRIN) 200 MG  tablet, Take 200 mg by mouth every 6 (six) hours as needed for moderate pain.  .  traMADol (ULTRAM) 50 MG tablet, Take 1 tablet (50 mg total) by mouth every 12 (twelve) hours as needed.   Current Outpatient Medications (Other):  .  amoxicillin (AMOXIL) 500 MG capsule, Take 2,000 mg by mouth See admin instructions. Only takes when has dental work done .  Coenzyme Q10 (CO Q 10) 100 MG CAPS, Take 1 tablet by mouth every morning.  .  LUTEIN-ZEAXANTHIN  PO*, Take 20 mg by mouth daily.  .  Simethicone 125 MG TABS, Take 1 tablet by mouth daily as needed (gas).  .  triamcinolone cream (KENALOG) 0.1 %, Apply 1 application topically 2 (two) times daily.  * These medications belong to multiple therapeutic classes and are listed under each applicable group.   Reviewed prior external information including notes and imaging from  primary care provider As well as notes that were available from care everywhere and other healthcare systems.  Past medical history, social, surgical and family history all reviewed in electronic medical record.  [name] pertanent information unless stated regarding to the chief complaint.   Review of Systems:  [name] headache, visual changes, nausea, vomiting, diarrhea, constipation, dizziness, abdominal pain, skin rash, fevers, chills, night sweats, weight loss, swollen lymph nodes, , joint swelling, chest pain, shortness of breath, mood changes. POSITIVE muscle aches, body aches  Objective  Blood pressure 120/82, pulse 68, height 5\' 7"  (1.702 m), weight 163 lb (73.9 kg), SpO2 99 %.    General: [name] apparent distress alert and oriented x3 mood and affect normal, dressed appropriately.  HEENT: Pupils equal, extraocular movements intact patient does have significant hard time hearing Respiratory: Patient's speak in full sentences and does not appear short of breath  Cardiovascular: [name] lower extremity edema, non tender, [name] erythema  Neuro: Cranial nerves II through XII are intact,  neurovascularly intact in all extremities with 2+ DTRs and 2+ pulses.  Gait antalgic and cautious MSK: Arthritic changes Back exam shows loss of lordosis and diffuse tenderness to palpation.  Patient does not have a true straight leg test but does seem to have worsened symptoms with extension.  Neurovascularly intact distally.   Impression and Recommendations:     This case required medical decision making of moderate complexity. The above documentation has been reviewed and is accurate and complete Lyndal Pulley, DO       Note: This dictation was prepared with Dragon dictation along with smaller phrase technology. Any transcriptional errors that result from this process are unintentional.

## 2019-10-25 ENCOUNTER — Encounter: Payer: Self-pay | Admitting: Family Medicine

## 2019-10-25 NOTE — Assessment & Plan Note (Addendum)
Chronic low back pain with worsening symptoms.  Patient has failed all conservative therapy.  Only responding to corticosteroids.  Patient notes also having an refractory anemia and awaiting further work-up including colonoscopy.  I believe that regimen addition of this we should consider the possibility of imaging of the lumbar spine with x-rays as well as MRI.  Patient would like to wait until after his colonoscopy for the advanced imaging.  Tramadol for breakthrough pain given today.  Will give patient pain taper of corticosteroids until the colonoscopy but patient is to address this with his gastroenterologist.  We will discuss further after advanced imaging patient knows of worsening pain to seek medical attention immediately.

## 2019-10-28 ENCOUNTER — Other Ambulatory Visit: Payer: Self-pay

## 2019-10-28 ENCOUNTER — Ambulatory Visit (INDEPENDENT_AMBULATORY_CARE_PROVIDER_SITE_OTHER)
Admission: RE | Admit: 2019-10-28 | Discharge: 2019-10-28 | Disposition: A | Discharge status: Home / Self Care | Payer: Medicare HMO | Source: Ambulatory Visit | Attending: Family Medicine | Admitting: Family Medicine

## 2019-10-28 DIAGNOSIS — M549 Dorsalgia, unspecified: Secondary | ICD-10-CM | POA: Diagnosis not present

## 2019-10-28 DIAGNOSIS — M545 Low back pain, unspecified: Secondary | ICD-10-CM

## 2019-10-31 DIAGNOSIS — E038 Other specified hypothyroidism: Secondary | ICD-10-CM | POA: Diagnosis not present

## 2019-10-31 DIAGNOSIS — R7989 Other specified abnormal findings of blood chemistry: Secondary | ICD-10-CM | POA: Diagnosis not present

## 2019-10-31 DIAGNOSIS — E785 Hyperlipidemia, unspecified: Secondary | ICD-10-CM | POA: Diagnosis not present

## 2019-10-31 DIAGNOSIS — M2550 Pain in unspecified joint: Secondary | ICD-10-CM | POA: Diagnosis not present

## 2019-10-31 DIAGNOSIS — I10 Essential (primary) hypertension: Secondary | ICD-10-CM | POA: Diagnosis not present

## 2019-10-31 DIAGNOSIS — Z1331 Encounter for screening for depression: Secondary | ICD-10-CM | POA: Diagnosis not present

## 2019-10-31 DIAGNOSIS — E291 Testicular hypofunction: Secondary | ICD-10-CM | POA: Diagnosis not present

## 2019-11-06 DIAGNOSIS — Z1152 Encounter for screening for COVID-19: Secondary | ICD-10-CM | POA: Diagnosis not present

## 2019-11-10 ENCOUNTER — Other Ambulatory Visit: Payer: Self-pay | Admitting: Family Medicine

## 2019-11-14 DIAGNOSIS — R195 Other fecal abnormalities: Secondary | ICD-10-CM | POA: Diagnosis not present

## 2019-11-14 DIAGNOSIS — D509 Iron deficiency anemia, unspecified: Secondary | ICD-10-CM | POA: Diagnosis not present

## 2019-11-14 DIAGNOSIS — K449 Diaphragmatic hernia without obstruction or gangrene: Secondary | ICD-10-CM | POA: Diagnosis not present

## 2019-11-14 DIAGNOSIS — K635 Polyp of colon: Secondary | ICD-10-CM | POA: Diagnosis not present

## 2019-11-14 DIAGNOSIS — D125 Benign neoplasm of sigmoid colon: Secondary | ICD-10-CM | POA: Diagnosis not present

## 2019-11-14 DIAGNOSIS — K648 Other hemorrhoids: Secondary | ICD-10-CM | POA: Diagnosis not present

## 2019-11-14 DIAGNOSIS — D122 Benign neoplasm of ascending colon: Secondary | ICD-10-CM | POA: Diagnosis not present

## 2019-11-14 DIAGNOSIS — D120 Benign neoplasm of cecum: Secondary | ICD-10-CM | POA: Diagnosis not present

## 2019-11-15 ENCOUNTER — Telehealth: Payer: Self-pay | Admitting: Family Medicine

## 2019-11-15 NOTE — Telephone Encounter (Signed)
Patient called stating that he had his colonoscopy yesterday and he found 5 polyps that they are biopsying.  He would like to cancel his MRI for now until he gets everything figured out.  I have canceled his MRI appointment. He will let us know when he would like to reschedule.

## 2019-11-19 DIAGNOSIS — Z860101 Personal history of adenomatous and serrated colon polyps: Secondary | ICD-10-CM | POA: Insufficient documentation

## 2019-11-20 ENCOUNTER — Other Ambulatory Visit: Payer: Medicare HMO

## 2019-11-22 DIAGNOSIS — Z0100 Encounter for examination of eyes and vision without abnormal findings: Secondary | ICD-10-CM | POA: Diagnosis not present

## 2019-11-26 DIAGNOSIS — M353 Polymyalgia rheumatica: Secondary | ICD-10-CM | POA: Diagnosis not present

## 2019-11-26 DIAGNOSIS — R768 Other specified abnormal immunological findings in serum: Secondary | ICD-10-CM | POA: Diagnosis not present

## 2019-11-26 DIAGNOSIS — E663 Overweight: Secondary | ICD-10-CM | POA: Diagnosis not present

## 2019-11-26 DIAGNOSIS — R7401 Elevation of levels of liver transaminase levels: Secondary | ICD-10-CM | POA: Diagnosis not present

## 2019-11-26 DIAGNOSIS — Z6825 Body mass index (BMI) 25.0-25.9, adult: Secondary | ICD-10-CM | POA: Diagnosis not present

## 2019-11-26 DIAGNOSIS — M2550 Pain in unspecified joint: Secondary | ICD-10-CM | POA: Diagnosis not present

## 2019-12-23 DIAGNOSIS — R69 Illness, unspecified: Secondary | ICD-10-CM | POA: Diagnosis not present

## 2020-01-30 DIAGNOSIS — D225 Melanocytic nevi of trunk: Secondary | ICD-10-CM | POA: Diagnosis not present

## 2020-01-30 DIAGNOSIS — B351 Tinea unguium: Secondary | ICD-10-CM | POA: Diagnosis not present

## 2020-01-30 DIAGNOSIS — L700 Acne vulgaris: Secondary | ICD-10-CM | POA: Diagnosis not present

## 2020-01-30 DIAGNOSIS — L814 Other melanin hyperpigmentation: Secondary | ICD-10-CM | POA: Diagnosis not present

## 2020-01-30 DIAGNOSIS — L821 Other seborrheic keratosis: Secondary | ICD-10-CM | POA: Diagnosis not present

## 2020-01-30 DIAGNOSIS — L570 Actinic keratosis: Secondary | ICD-10-CM | POA: Diagnosis not present

## 2020-01-30 DIAGNOSIS — L438 Other lichen planus: Secondary | ICD-10-CM | POA: Diagnosis not present

## 2020-01-30 DIAGNOSIS — L738 Other specified follicular disorders: Secondary | ICD-10-CM | POA: Diagnosis not present

## 2020-01-30 DIAGNOSIS — L918 Other hypertrophic disorders of the skin: Secondary | ICD-10-CM | POA: Diagnosis not present

## 2020-02-10 ENCOUNTER — Encounter: Payer: Self-pay | Admitting: Family Medicine

## 2020-02-10 DIAGNOSIS — E785 Hyperlipidemia, unspecified: Secondary | ICD-10-CM | POA: Diagnosis not present

## 2020-02-10 DIAGNOSIS — M8700 Idiopathic aseptic necrosis of unspecified bone: Secondary | ICD-10-CM | POA: Diagnosis not present

## 2020-02-10 DIAGNOSIS — M545 Low back pain: Secondary | ICD-10-CM | POA: Diagnosis not present

## 2020-02-10 DIAGNOSIS — I480 Paroxysmal atrial fibrillation: Secondary | ICD-10-CM | POA: Diagnosis not present

## 2020-02-10 DIAGNOSIS — M353 Polymyalgia rheumatica: Secondary | ICD-10-CM | POA: Diagnosis not present

## 2020-02-10 DIAGNOSIS — I10 Essential (primary) hypertension: Secondary | ICD-10-CM | POA: Diagnosis not present

## 2020-02-19 DIAGNOSIS — M353 Polymyalgia rheumatica: Secondary | ICD-10-CM | POA: Diagnosis not present

## 2020-02-19 DIAGNOSIS — Z6826 Body mass index (BMI) 26.0-26.9, adult: Secondary | ICD-10-CM | POA: Diagnosis not present

## 2020-02-19 DIAGNOSIS — R7401 Elevation of levels of liver transaminase levels: Secondary | ICD-10-CM | POA: Diagnosis not present

## 2020-02-19 DIAGNOSIS — M87052 Idiopathic aseptic necrosis of left femur: Secondary | ICD-10-CM | POA: Diagnosis not present

## 2020-02-19 DIAGNOSIS — E663 Overweight: Secondary | ICD-10-CM | POA: Diagnosis not present

## 2020-02-19 DIAGNOSIS — R768 Other specified abnormal immunological findings in serum: Secondary | ICD-10-CM | POA: Diagnosis not present

## 2020-02-19 DIAGNOSIS — M87051 Idiopathic aseptic necrosis of right femur: Secondary | ICD-10-CM | POA: Diagnosis not present

## 2020-03-31 NOTE — Progress Notes (Signed)
HPI: Follow-up history of mitral valve repair secondary to mitral valve prolapse mitral regurgitation, paroxysmal atrial fibrillation (postoperative), hypertension and hyperlipidemia. Previously followed by Dr. Acie Fredrickson. Patient had mitral valve repair in 2012. Note preoperative cardiac catheterization March 2012 showed no coronary disease. Echocardiogram February 2019 showed normal LV function, prior mitral valve repair with "trace mitral stenosis" and mild tricuspid regurgitation. CTA November 2020 showed aortic atherosclerosis but no aneurysm. Since last seen  patient denies dyspnea, chest pain or syncope.  Occasional brief palpitations not sustained.  Current Outpatient Medications  Medication Sig Dispense Refill  . amLODipine (NORVASC) 5 MG tablet Take 5 mg by mouth daily.     Marland Kitchen amoxicillin (AMOXIL) 500 MG capsule Take 2,000 mg by mouth See admin instructions. Only takes when has dental work done    . aspirin 81 MG tablet Take 81 mg by mouth daily.      . Coenzyme Q10 (CO Q 10) 100 MG CAPS Take 1 tablet by mouth every morning.     . colchicine 0.6 MG tablet Take 1 tablet (0.6 mg total) by mouth daily. 5 tablet 0  . ibuprofen (ADVIL,MOTRIN) 200 MG tablet Take 200 mg by mouth every 6 (six) hours as needed for moderate pain.     Marland Kitchen levothyroxine (SYNTHROID) 125 MCG tablet Take 1 tablet (125 mcg total) by mouth daily. 30 tablet 6  . losartan (COZAAR) 50 MG tablet Take 50 mg by mouth 2 (two) times daily.    . LUTEIN-ZEAXANTHIN PO Take 20 mg by mouth daily.     . metoprolol (TOPROL-XL) 100 MG 24 hr tablet Take 100 mg by mouth daily.      . pravastatin (PRAVACHOL) 40 MG tablet Take by mouth.    . predniSONE (DELTASONE) 20 MG tablet Prednisone 20 mg daily for 5 days 10 mg daily for 10 days then 5 mg until bottle is out 12 tablet 0  . Simethicone 125 MG TABS Take 1 tablet by mouth daily as needed (gas).     . simvastatin (ZOCOR) 20 MG tablet     . traMADol (ULTRAM) 50 MG tablet Take 1 tablet  (50 mg total) by mouth every 12 (twelve) hours as needed. 10 tablet 0  . triamcinolone cream (KENALOG) 0.1 % Apply 1 application topically 2 (two) times daily.      No current facility-administered medications for this visit.     Past Medical History:  Diagnosis Date  . Arthritis   . Atrial fibrillation ([place])   . CHF (congestive heart failure) ([facility]) CHRONIC SYSTOLIC WITH ACUTE EXACERBATIONS  . GERD (gastroesophageal reflux disease)   . Heart murmur   . Hyperlipemia   . Hypertension   . MVP (mitral valve prolapse) WITH SEVERE MITRAL REGURG  . Nephrolithiasis   . Palpitations WITH PREMATURE VENTRICULAR COTRACTIONS AND PREMATURE ATRIAL CONTRACTIONS  . Rheumatic fever REPORTED HISTORY  . Sensorineural hearing loss   . Varicose veins   . Weakness of right upper extremity RELATED TO RUPTURED BICEPS TENDON    Past Surgical History:  Procedure Laterality Date  . HEMORRHOID SURGERY    . HERNIA REPAIR    . HYDROCELE EXCISION / REPAIR Left   . INGUINAL HERNIA REPAIR Right    Dr. Deon Pilling  . MITRAL VALVE REPAIR  08/19/2010   Dr. Ricard Dillon.  complex valvuloplasty with 60mm ring annuloplasty via right mini thoracotomy (Dr. Roxy Manns)  . Multiple surgical procedures in his use for    . ROTATOR CUFF REPAIR    .  The patient has also had    . TONSILLECTOMY    . URETER ECTOPIC RESECTION Left     Social History   Socioeconomic History  . Marital status: Married    Spouse name: Not on file  . Number of children: 0  . Years of education: Not on file  . Highest education level: Not on file  Occupational History  . Not on file  Tobacco Use  . Smoking status: Former Smoker    Types: Cigarettes    Quit date: 08/12/1994    Years since quitting: 25.6  . Smokeless tobacco: Never Used  Vaping Use  . Vaping Use: Never used  Substance and Sexual Activity  . Alcohol use: No  . Drug use: No  . Sexual activity: Not on file  Other Topics Concern  . Not on file  Social History Narrative  . Not on  file   Social Determinants of Health   Financial Resource Strain:   . Difficulty of Paying Living Expenses: Not on file  Food Insecurity:   . Worried About Charity fundraiser in the Last Year: Not on file  . Ran Out of Food in the Last Year: Not on file  Transportation Needs:   . Lack of Transportation (Medical): Not on file  . Lack of Transportation (Non-Medical): Not on file  Physical Activity:   . Days of Exercise per Week: Not on file  . Minutes of Exercise per Session: Not on file  Stress:   . Feeling of Stress : Not on file  Social Connections:   . Frequency of Communication with Friends and Family: Not on file  . Frequency of Social Gatherings with Friends and Family: Not on file  . Attends Religious Services: Not on file  . Active Member of Clubs or Organizations: Not on file  . Attends Archivist Meetings: Not on file  . Marital Status: Not on file  Intimate Partner Violence:   . Fear of Current or Ex-Partner: Not on file  . Emotionally Abused: Not on file  . Physically Abused: Not on file  . Sexually Abused: Not on file    Family History  Problem Relation Age of Onset  . Heart attack Mother   . Angina Mother   . Hypertension Mother   . Dementia Mother   . Seizures Father   . Hypertension Sister   . Hypertension Brother   . Colon cancer Neg Hx     ROS: no fevers or chills, productive cough, hemoptysis, dysphasia, odynophagia, melena, hematochezia, dysuria, hematuria, rash, seizure activity, orthopnea, PND, pedal edema, claudication. Remaining systems are negative.  Physical Exam: Well-developed well-nourished in no acute distress.  Skin is warm and dry.  HEENT is normal.  Neck is supple.  Chest is clear to auscultation with normal expansion.  Cardiovascular exam is regular rate and rhythm.  Abdominal exam nontender or distended. No masses palpated. Extremities show no edema. neuro grossly intact  A/P  1 palpitations-patient's palpitations  have improved.  We discussed the potential for a watch to monitor his symptoms and to rule out recurrent atrial fibrillation that he had postoperatively.  2 history of mitral valve repair-continue SBE prophylaxis.  3 hypertension-blood pressure is controlled today.  Continue present medications and follow.  4 hyperlipidemia-continue statin.  5 history of aortic atherosclerosis-continue medical therapy including aspirin and statin.  [name], MD

## 2020-04-06 ENCOUNTER — Ambulatory Visit: Payer: Medicare HMO | Admitting: Cardiology

## 2020-04-06 ENCOUNTER — Other Ambulatory Visit: Payer: Self-pay

## 2020-04-06 ENCOUNTER — Encounter: Payer: Self-pay | Admitting: Cardiology

## 2020-04-06 VITALS — BP 108/50 | HR 60 | Ht 67.0 in | Wt 170.0 lb

## 2020-04-06 DIAGNOSIS — Z9889 Other specified postprocedural states: Secondary | ICD-10-CM

## 2020-04-06 DIAGNOSIS — E785 Hyperlipidemia, unspecified: Secondary | ICD-10-CM

## 2020-04-06 DIAGNOSIS — I10 Essential (primary) hypertension: Secondary | ICD-10-CM

## 2020-04-06 NOTE — Patient Instructions (Signed)

## 2020-05-14 ENCOUNTER — Other Ambulatory Visit: Payer: Self-pay | Admitting: Internal Medicine

## 2020-05-14 DIAGNOSIS — R5381 Other malaise: Secondary | ICD-10-CM

## 2020-05-19 DIAGNOSIS — S62172A Displaced fracture of trapezium [larger multangular], left wrist, initial encounter for closed fracture: Secondary | ICD-10-CM | POA: Diagnosis not present

## 2020-05-29 DIAGNOSIS — M353 Polymyalgia rheumatica: Secondary | ICD-10-CM | POA: Diagnosis not present

## 2020-05-29 DIAGNOSIS — M87051 Idiopathic aseptic necrosis of right femur: Secondary | ICD-10-CM | POA: Diagnosis not present

## 2020-05-29 DIAGNOSIS — R768 Other specified abnormal immunological findings in serum: Secondary | ICD-10-CM | POA: Diagnosis not present

## 2020-05-29 DIAGNOSIS — E663 Overweight: Secondary | ICD-10-CM | POA: Diagnosis not present

## 2020-05-29 DIAGNOSIS — M87052 Idiopathic aseptic necrosis of left femur: Secondary | ICD-10-CM | POA: Diagnosis not present

## 2020-05-29 DIAGNOSIS — R7401 Elevation of levels of liver transaminase levels: Secondary | ICD-10-CM | POA: Diagnosis not present

## 2020-05-29 DIAGNOSIS — Z6827 Body mass index (BMI) 27.0-27.9, adult: Secondary | ICD-10-CM | POA: Diagnosis not present

## 2020-05-30 DIAGNOSIS — R69 Illness, unspecified: Secondary | ICD-10-CM | POA: Diagnosis not present

## 2020-06-02 DIAGNOSIS — Z125 Encounter for screening for malignant neoplasm of prostate: Secondary | ICD-10-CM | POA: Diagnosis not present

## 2020-06-02 DIAGNOSIS — E291 Testicular hypofunction: Secondary | ICD-10-CM | POA: Diagnosis not present

## 2020-06-02 DIAGNOSIS — E785 Hyperlipidemia, unspecified: Secondary | ICD-10-CM | POA: Diagnosis not present

## 2020-06-10 DIAGNOSIS — E038 Other specified hypothyroidism: Secondary | ICD-10-CM | POA: Diagnosis not present

## 2020-06-16 DIAGNOSIS — Z0000 Encounter for general adult medical examination without abnormal findings: Secondary | ICD-10-CM | POA: Diagnosis not present

## 2020-06-16 DIAGNOSIS — R82998 Other abnormal findings in urine: Secondary | ICD-10-CM | POA: Diagnosis not present

## 2020-06-16 DIAGNOSIS — E291 Testicular hypofunction: Secondary | ICD-10-CM | POA: Diagnosis not present

## 2020-06-16 DIAGNOSIS — K8020 Calculus of gallbladder without cholecystitis without obstruction: Secondary | ICD-10-CM | POA: Diagnosis not present

## 2020-06-16 DIAGNOSIS — M549 Dorsalgia, unspecified: Secondary | ICD-10-CM | POA: Diagnosis not present

## 2020-06-16 DIAGNOSIS — M25561 Pain in right knee: Secondary | ICD-10-CM | POA: Diagnosis not present

## 2020-06-16 DIAGNOSIS — I10 Essential (primary) hypertension: Secondary | ICD-10-CM | POA: Diagnosis not present

## 2020-06-16 DIAGNOSIS — M353 Polymyalgia rheumatica: Secondary | ICD-10-CM | POA: Diagnosis not present

## 2020-06-16 DIAGNOSIS — M545 Low back pain, unspecified: Secondary | ICD-10-CM | POA: Diagnosis not present

## 2020-06-16 DIAGNOSIS — N529 Male erectile dysfunction, unspecified: Secondary | ICD-10-CM | POA: Diagnosis not present

## 2020-06-16 DIAGNOSIS — E038 Other specified hypothyroidism: Secondary | ICD-10-CM | POA: Diagnosis not present

## 2020-06-16 DIAGNOSIS — E785 Hyperlipidemia, unspecified: Secondary | ICD-10-CM | POA: Diagnosis not present

## 2020-06-16 DIAGNOSIS — Z7952 Long term (current) use of systemic steroids: Secondary | ICD-10-CM | POA: Diagnosis not present

## 2020-06-16 DIAGNOSIS — R69 Illness, unspecified: Secondary | ICD-10-CM | POA: Diagnosis not present

## 2020-06-16 DIAGNOSIS — M25512 Pain in left shoulder: Secondary | ICD-10-CM | POA: Diagnosis not present

## 2020-06-18 DIAGNOSIS — S62172A Displaced fracture of trapezium [larger multangular], left wrist, initial encounter for closed fracture: Secondary | ICD-10-CM | POA: Diagnosis not present

## 2020-07-23 DIAGNOSIS — S62172A Displaced fracture of trapezium [larger multangular], left wrist, initial encounter for closed fracture: Secondary | ICD-10-CM | POA: Diagnosis not present

## 2020-08-10 DIAGNOSIS — H903 Sensorineural hearing loss, bilateral: Secondary | ICD-10-CM | POA: Diagnosis not present

## 2020-09-28 ENCOUNTER — Telehealth: Payer: Self-pay | Admitting: Cardiology

## 2020-09-28 NOTE — Telephone Encounter (Signed)
4.18.22 LVM on cell to sch. 6 mo fu w/Dr [name].[name]

## 2020-10-06 DIAGNOSIS — M87051 Idiopathic aseptic necrosis of right femur: Secondary | ICD-10-CM | POA: Diagnosis not present

## 2020-10-06 DIAGNOSIS — R768 Other specified abnormal immunological findings in serum: Secondary | ICD-10-CM | POA: Diagnosis not present

## 2020-10-06 DIAGNOSIS — Z6827 Body mass index (BMI) 27.0-27.9, adult: Secondary | ICD-10-CM | POA: Diagnosis not present

## 2020-10-06 DIAGNOSIS — R7401 Elevation of levels of liver transaminase levels: Secondary | ICD-10-CM | POA: Diagnosis not present

## 2020-10-06 DIAGNOSIS — M353 Polymyalgia rheumatica: Secondary | ICD-10-CM | POA: Diagnosis not present

## 2020-10-06 DIAGNOSIS — M87052 Idiopathic aseptic necrosis of left femur: Secondary | ICD-10-CM | POA: Diagnosis not present

## 2020-10-06 DIAGNOSIS — E663 Overweight: Secondary | ICD-10-CM | POA: Diagnosis not present

## 2020-11-04 ENCOUNTER — Ambulatory Visit: Payer: Medicare HMO | Attending: Internal Medicine

## 2020-11-04 DIAGNOSIS — Z23 Encounter for immunization: Secondary | ICD-10-CM

## 2020-11-04 NOTE — Progress Notes (Signed)
   Covid-19 Vaccination Clinic  Name:  BILAAL LEIB    MRN: [id number] DOB: 1950/11/21  11/04/2020  Mr. Krantz was observed post Covid-19 immunization for 15 minutes without incident. He was provided with Vaccine Information Sheet and instruction to access the V-Safe system.   Mr. Varkey was instructed to call 911 with any severe reactions post vaccine: Marland Kitchen Difficulty breathing  . Swelling of face and throat  . A fast heartbeat  . A bad rash all over body  . Dizziness and weakness   Immunizations Administered    Name Date Dose VIS Date Route   PFIZER Comrnaty(Gray TOP) Covid-19 Vaccine 11/04/2020 10:39 AM 0.3 mL 05/21/2020 Intramuscular   Manufacturer: [employer], [employer]   Lot: [id number]   NDC: [phone]

## 2020-11-05 ENCOUNTER — Other Ambulatory Visit (HOSPITAL_COMMUNITY): Payer: Self-pay

## 2020-11-05 MED ORDER — COVID-19 MRNA VAC-TRIS(PFIZER) 30 MCG/0.3ML IM SUSP
INTRAMUSCULAR | 0 refills | Status: DC
  Filled 2020-11-05: qty 0.3, 17d supply, fill #0

## 2020-11-06 ENCOUNTER — Other Ambulatory Visit (HOSPITAL_COMMUNITY): Payer: Self-pay

## 2021-02-08 DIAGNOSIS — M87052 Idiopathic aseptic necrosis of left femur: Secondary | ICD-10-CM | POA: Diagnosis not present

## 2021-02-08 DIAGNOSIS — E663 Overweight: Secondary | ICD-10-CM | POA: Diagnosis not present

## 2021-02-08 DIAGNOSIS — R7401 Elevation of levels of liver transaminase levels: Secondary | ICD-10-CM | POA: Diagnosis not present

## 2021-02-08 DIAGNOSIS — M353 Polymyalgia rheumatica: Secondary | ICD-10-CM | POA: Diagnosis not present

## 2021-02-08 DIAGNOSIS — Z6827 Body mass index (BMI) 27.0-27.9, adult: Secondary | ICD-10-CM | POA: Diagnosis not present

## 2021-02-08 DIAGNOSIS — R768 Other specified abnormal immunological findings in serum: Secondary | ICD-10-CM | POA: Diagnosis not present

## 2021-02-08 DIAGNOSIS — M87051 Idiopathic aseptic necrosis of right femur: Secondary | ICD-10-CM | POA: Diagnosis not present

## 2021-02-16 ENCOUNTER — Telehealth: Payer: Self-pay | Admitting: Cardiology

## 2021-02-16 NOTE — Telephone Encounter (Signed)
Pt sent this Via MyChart to our [facility] pool:    Swelling in both ankles, like puffy donuts around the ankles above the feet (photo attached from the morning) 2 pound weight gain so far, I have NOT gained 3 to 5 in a week My daily weights have been between 170.0 and 170.6 lbs each day for weeks.  This morning it was 172.6 lbs No fluid pills Not short of breath No travel   ----- Message -----      From:Shana S      Sent:02/16/2021  1:09 PM EDT        [name]   Subject:Appointment Request  I sure can.  Can you answer a couple questions for me about your swelling so I can forward it to our triage nurse?    If swelling, where is the swelling located?   How much weight have you gained and in what time span?   Have you gained 3 pounds in a day or 5 pounds in a week?   Do you have a log of your daily weights (if so, list)?   Are you currently taking a fluid pill?   Are you currently SOB?   Have you traveled recently?     ----- Message -----      From:[name]      Sent:02/16/2021  1:06 PM EDT        To:[name], MD   Subject:Appointment Request  Can you put me on your list to call if there is a cancellation?  I have been suddenly getting some fluid retention, this morning had puffy donuts around both ankles, which I have never had before, and was 2 pounds heavier than usual.  Back in February I was told I would be given an appointment for October but no one ever gave me one. October will be a year since my last visit

## 2021-02-16 NOTE — Telephone Encounter (Signed)
Pt added this to the previous message:   Thanks.  Although I have no SOB, when I climb the steep staircase to the 2nd floor of my garage a couple of times in succession, my legs ache badly.... front and back of lower thighs, upper calves, hips, and front and back of knees.  I've had bad varicose veins for decades, and believe I may have peripheral vascular disease at this point.

## 2021-02-16 NOTE — Telephone Encounter (Signed)
Spoke with patient of Dr. [name] who reports ankle swelling (described as donuts), slight weight gain, and some leg pain. He follows a low sodium diet. He is due for a yearly visit in Oct but is not scheduled until Jan 2023. Scheduled him for acute visit on 9/8 with Melvenia Needles PA

## 2021-02-17 NOTE — Progress Notes (Signed)
Cardiology Office Note:    Date:  02/17/2021   ID:  [name], DOB 1951/04/01, MRN [id number]  PCP:  [name], MD Referring MD: [name], Sh[place]  Cardiologist:  [name], MD   Reason for visit: Leg swelling  History of Present Illness:    [name] is a 70 y.o. male with a hx of mitral valve repair 2012 secondary to mitral valve prolapse mitral regurgitation, postop paroxysmal atrial fibrillation, hypertension and hyperlipidemia. Preop LHC in March 2012 showed no coronary disease. Echocardiogram February 2019 showed normal LV function, prior mitral valve repair with "trace mitral stenosis" and mild tricuspid regurgitation. CTA November 2020 showed aortic atherosclerosis but no aneurysm.   She last saw Dr. [name] in October 2021 and noted improvement in palpitations.  The patient called our office this week complaining of ankle swelling and discomfort.  Today he comes in with concerns of bilateral ankle swelling that occurred 2 days ago.  He stated his ankles look like donuts.  This was associated with a 2 pound weight gain.  He ended up taking half tablet of Lasix 20 mg.  This was his wife's medication which she takes for lymphedema post breast cancer.  His swelling improved after Lasix.  He mentions chronic mild right ankle puffiness.  He has significant varicosities and has seen a vein specialist in the past.  He did not follow-up with vein surgery due to life events at the time.  He states his weight is back at baseline.  He notes his blood pressure has been increased at night recently.  His primary care switched him from losartan once a day to 50 mg twice daily.  He takes amlodipine at bedtime.  He states his blood pressure is usually less than 120/70.  He does notice a difference in his blood pressure depending on the losartan specific brand.  The generic brand changed on his refill on Friday.  And he noticed his blood  pressure was higher with systolic blood pressures that 140s to 150s.  He thinks his increased blood pressure may be correlated with his ankle puffiness.  His blood pressure at home this morning was 120/72.  He has compression stockings though does not wear them.  He avoids salt.  He states he is very active.  He does yard work and walks the dogs at the park every day.  Denies chest pain.  He does note dyspnea on exertion when it is very hot outside or when he is climbing a steep flight of stairs.  He thinks his dyspnea has increased overall.  He mentions 1 instance of lightheadedness due to heat and overexertion.  No syncope or palpitations.  No orthopnea or PND.    Past Medical History:  Diagnosis Date   Arthritis    Atrial fibrillation (HCC)    CHF (congestive heart failure) (HCC) CHRONIC SYSTOLIC WITH ACUTE EXACERBATIONS   GERD (gastroesophageal reflux disease)    Heart murmur    Hyperlipemia    Hypertension    MVP (mitral valve prolapse) WITH SEVERE MITRAL REGURG   Nephrolithiasis    Palpitations WITH PREMATURE VENTRICULAR COTRACTIONS AND PREMATURE ATRIAL CONTRACTIONS   Rheumatic fever REPORTED HISTORY   Sensorineural hearing loss    Varicose veins    Weakness of right upper extremity RELATED TO RUPTURED BICEPS TENDON    Past Surgical History:  Procedure Laterality Date   HEMORRHOID SURGERY     HERNIA REPAIR  HYDROCELE EXCISION / REPAIR Left    INGUINAL HERNIA REPAIR Right    Dr. Deon Pilling   MITRAL VALVE REPAIR  08/19/2010   Dr. Ricard Dillon.  complex valvuloplasty with 65m ring annuloplasty via right mini thoracotomy (Dr. ORoxy Manns   Multiple surgical procedures in his use for     ROTATOR CUFF REPAIR     The patient has also had     TONSILLECTOMY     URETER ECTOPIC RESECTION Left     Current Medications: No outpatient medications have been marked as taking for the 02/18/21 encounter (Appointment) with LWarren Lacy PA-C.     Allergies:   Atenolol, Nadolol, Statins, and  Hydromorphone   Social History   Socioeconomic History   Marital status: Married    Spouse name: Not on file   Number of children: 0   Years of education: Not on file   Highest education level: Not on file  Occupational History   Not on file  Tobacco Use   Smoking status: Former    Types: Cigarettes    Quit date: 08/12/1994    Years since quitting: 26.5   Smokeless tobacco: Never  Vaping Use   Vaping Use: Never used  Substance and Sexual Activity   Alcohol use: No   Drug use: No   Sexual activity: Not on file  Other Topics Concern   Not on file  Social History Narrative   Not on file   Social Determinants of Health   Financial Resource Strain: Not on file  Food Insecurity: Not on file  Transportation Needs: Not on file  Physical Activity: Not on file  Stress: Not on file  Social Connections: Not on file     Family History: The patient's family history includes Angina in his mother; Dementia in his mother; Heart attack in his mother; Hypertension in his brother, mother, and sister; Seizures in his father. There is no history of Colon cancer.  ROS:   Please see the history of present illness.     EKGs/Labs/Other Studies Reviewed:    EKG:  The ekg ordered today demonstrates sinus bradycardia, heart rate 59, QRS 82 ms, early repolarization unchanged from April 2021.  Recent Labs: No results found for requested labs within last 8760 hours.  Recent Lipid Panel No results found for: CHOL, TRIG, HDL, CHOLHDL, VLDL, LDLCALC, LDLDIRECT  Physical Exam:    VS:  There were no vitals taken for this visit.    Wt Readings from Last 3 Encounters:  04/06/20 170 lb (77.1 kg)  10/24/19 163 lb (73.9 kg)  09/30/19 165 lb 12.8 oz (75.2 kg)     GEN:  Well nourished, well developed in no acute distress HEENT: Normal NECK: No JVD; No carotid bruits CARDIAC: RRR, no murmurs, rubs, gallops RESPIRATORY:  Clear to auscultation without rales, wheezing or rhonchi  ABDOMEN: Soft,  non-tender, non-distended MUSCULOSKELETAL: trace edema bilaterally below knees; No deformity  SKIN: Warm and dry.  Significant varicosities bilaterally. NEUROLOGIC:  Alert and oriented PSYCHIATRIC:  Normal affect   ASSESSMENT AND PLAN   LE edema, improved - Previous [place]/venous reflux study 05/2018 showed abnormal reflux times in both the left and right leg.  No DVT. -Edema improved post Lasix 10 mg.  Told him it is okay to take this dose as needed. -He think he would benefit more from wearing compression stockings daily. -His varicose veins cause him discomfort or persistent swelling, I would recommend following up with a vein specialist. -We will order repeat  2D echo with his history of mitral regurgitation though I do not hear significant murmur on exam. -Do not suspect a DVT.  Mitral regurgitation s/p mitral valve repair 2012 -Continue SBE PE prophylaxis -Check 2D echo with increased dyspnea on exertion and lower extremity edema.  Aortic atherosclerosis -continue aspirin and statin. -LDL 100 and December 2021.  Continue pravastatin.  Has his annual physical scheduled later this year.  Goal LDL less than 100.  Hypertension, overall controlled - He states he is sensitive to generic brands.  He is talking to his pharmacist tomorrow to see if they can get the brand of losartan that has worked well from the past.  I have offered switching him to valsartan to see if that works better for him.  He will call back if he would like to make this switch after talking to his pharmacist. - Goal BP is <130/80.  Recommend DASH diet (high in vegetables, fruits, low-fat dairy products, whole grains, poultry, fish, and nuts and low in sweets, sugar-sweetened beverages, and red meats), salt restriction and increase physical activity.  Disposition - Follow-up as scheduled.  He will call if he would like to switch to valsartan after speaking to this pharmacist.         Medication  Adjustments/Labs and Tests Ordered: Current medicines are reviewed at length with the patient today.  Concerns regarding medicines are outlined above.  No orders of the defined types were placed in this encounter.  No orders of the defined types were placed in this encounter.   There are no Patient Instructions on file for this visit.   Signed, Warren Lacy, PA-C  02/17/2021 11:08 PM    [facility]

## 2021-02-18 ENCOUNTER — Ambulatory Visit: Payer: Medicare HMO | Admitting: Physician Assistant

## 2021-02-18 ENCOUNTER — Other Ambulatory Visit: Payer: Self-pay

## 2021-02-18 ENCOUNTER — Encounter: Payer: Self-pay | Admitting: Physician Assistant

## 2021-02-18 VITALS — BP 140/66 | HR 60 | Ht 67.0 in | Wt 174.6 lb

## 2021-02-18 DIAGNOSIS — E785 Hyperlipidemia, unspecified: Secondary | ICD-10-CM | POA: Diagnosis not present

## 2021-02-18 DIAGNOSIS — Z9889 Other specified postprocedural states: Secondary | ICD-10-CM

## 2021-02-18 DIAGNOSIS — R002 Palpitations: Secondary | ICD-10-CM | POA: Diagnosis not present

## 2021-02-18 DIAGNOSIS — I700 Atherosclerosis of aorta: Secondary | ICD-10-CM

## 2021-02-18 DIAGNOSIS — I10 Essential (primary) hypertension: Secondary | ICD-10-CM

## 2021-02-18 DIAGNOSIS — R600 Localized edema: Secondary | ICD-10-CM | POA: Diagnosis not present

## 2021-02-18 NOTE — Patient Instructions (Signed)
Medication Instructions:  No Changes *If you need a refill on your cardiac medications before your next appointment, please call your pharmacy*   Lab Work: No Labs If you have labs (blood work) drawn today and your tests are completely normal, you will receive your results only by: [place] (if you have MyChart) OR A paper copy in the mail If you have any lab test that is abnormal or we need to change your treatment, we will call you to review the results.   Testing/Procedures: [address], [address] Your physician has requested that you have an echocardiogram. Echocardiography is a painless test that uses sound waves to create images of your heart. It provides your doctor with information about the size and shape of your heart and how well your heart's chambers and valves are working. This procedure takes approximately one hour. There are no restrictions for this procedure.    Follow-Up: At [facility], you and your health needs are our priority.  As part of our continuing mission to provide you with exceptional heart care, we have created designated Provider Care Teams.  These Care Teams include your primary Cardiologist (physician) and Advanced Practice Providers (APPs -  Physician Assistants and Nurse Practitioners) who all work together to provide you with the care you need, when you need it.   Your next appointment:   June 30, 2021 4:00 PM  The format for your next appointment:   In Person  Provider:   [name], MD   Other Instructions Recommended wear Compression Stocking. Consider taking Valsartan per conversation with Anderson Malta.

## 2021-02-19 ENCOUNTER — Other Ambulatory Visit: Payer: Self-pay

## 2021-02-19 MED ORDER — VALSARTAN 160 MG PO TABS: 160.0000 mg | ORAL_TABLET | Freq: Every day | ORAL | 3 refills | Status: DC

## 2021-02-19 NOTE — Telephone Encounter (Signed)
Called patient about changes. Per Caron Presume PA,  Stop losartan and Start Valsartan 160 mg once daily. Please tell patient that I am optimistic his BP will be better controlled. Patient agreed to plan and will contact us if he has any other concerns.

## 2021-02-22 ENCOUNTER — Other Ambulatory Visit: Payer: Self-pay

## 2021-03-05 ENCOUNTER — Ambulatory Visit: Payer: Medicare HMO | Admitting: Cardiology

## 2021-03-12 ENCOUNTER — Ambulatory Visit (HOSPITAL_COMMUNITY): Payer: Medicare HMO | Attending: Cardiology

## 2021-03-12 ENCOUNTER — Other Ambulatory Visit: Payer: Self-pay

## 2021-03-12 DIAGNOSIS — I10 Essential (primary) hypertension: Secondary | ICD-10-CM | POA: Diagnosis not present

## 2021-03-12 DIAGNOSIS — Z9889 Other specified postprocedural states: Secondary | ICD-10-CM | POA: Insufficient documentation

## 2021-03-12 DIAGNOSIS — Z954 Presence of other heart-valve replacement: Secondary | ICD-10-CM

## 2021-03-12 DIAGNOSIS — R002 Palpitations: Secondary | ICD-10-CM | POA: Insufficient documentation

## 2021-03-12 DIAGNOSIS — E785 Hyperlipidemia, unspecified: Secondary | ICD-10-CM | POA: Insufficient documentation

## 2021-03-12 DIAGNOSIS — I700 Atherosclerosis of aorta: Secondary | ICD-10-CM | POA: Insufficient documentation

## 2021-03-12 DIAGNOSIS — R600 Localized edema: Secondary | ICD-10-CM | POA: Diagnosis not present

## 2021-03-14 LAB — ECHOCARDIOGRAM COMPLETE
Area-P 1/2: 2.62 cm2
MV VTI: 2.26 cm2
S' Lateral: 2.7 cm

## 2021-03-18 ENCOUNTER — Telehealth: Payer: Self-pay

## 2021-03-18 NOTE — Telephone Encounter (Addendum)
Called patient with results of echocardiogram.----- Message from Warren Lacy, PA-C sent at 03/17/2021  7:39 AM EDT ----- Normal heart squeeze with ejection fraction 60 to 65%.  There is mild mitral stenosis s/p mitral ring 2012.  No mitral regurgitation.  Nothing significant enough to contribute to leg swelling or cause other heart failure symptoms.  We can continue to monitor periodically.

## 2021-04-18 IMAGING — CT CT CTA ABD/PEL W/CM AND/OR W/O CM
2 of 5 series · 15 of 46 positions shown, 17 images · IV contrast (OMNI 350)
Comparison: Prior CTA of the chest, abdomen and pelvis on
01/26/2017

CLINICAL DATA: Lower abdominal pain, right greater than left.

EXAM:
CT ANGIOGRAPHY ABDOMEN AND PELVIS WITH CONTRAST AND WITHOUT CONTRAST
TECHNIQUE: Multidetector CT imaging of the abdomen and pelvis was performed
using the standard protocol during bolus administration of
intravenous contrast. Multiplanar reconstructed images and MIPs were
obtained and reviewed to evaluate the vascular anatomy.
CONTRAST:  100mL OMNIPAQUE IOHEXOL 350 MG/ML SOLN

[Series 5: dissection 2mm · axial · 0.86mm/px · z∈[+826,+1216]mm · 12 of 225 slices shown, 14 images]
[im 15/225  soft-tissue]
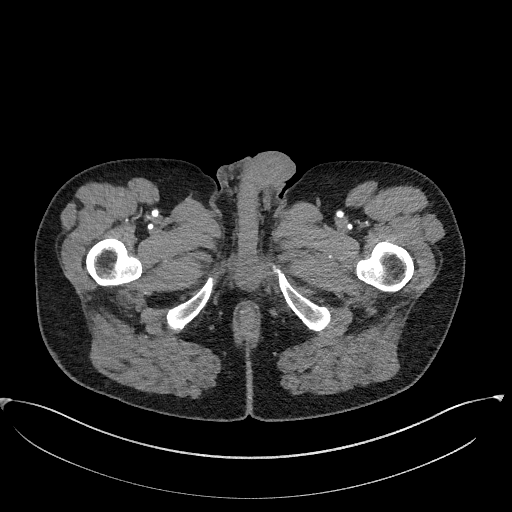
[im 15/225  bone]
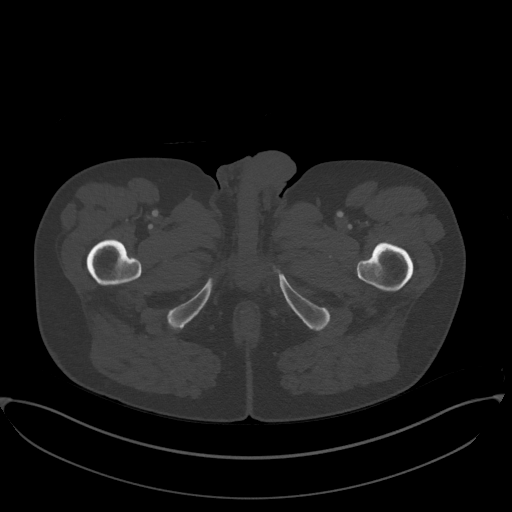
[im 29/225  soft-tissue]
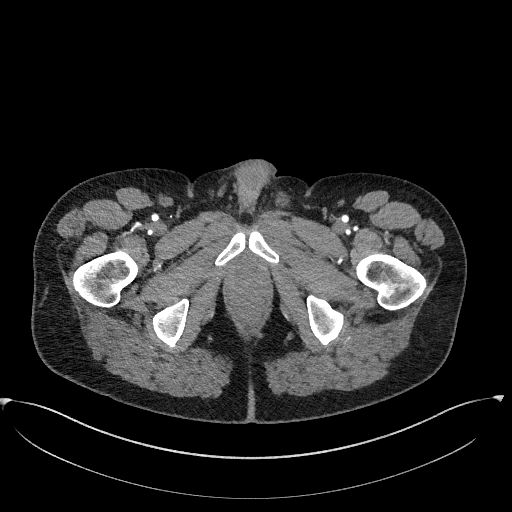
[im 51/225  soft-tissue]
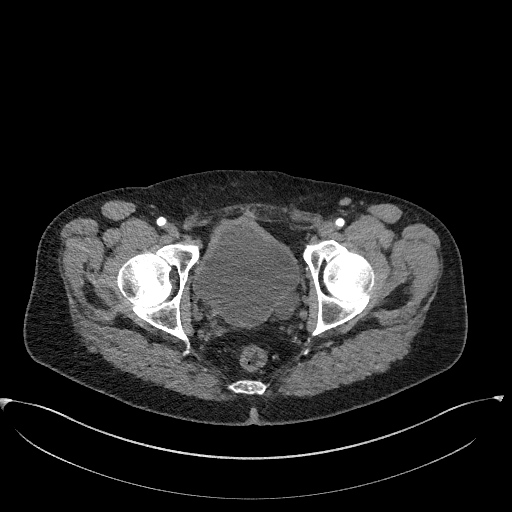
[im 66/225  soft-tissue]
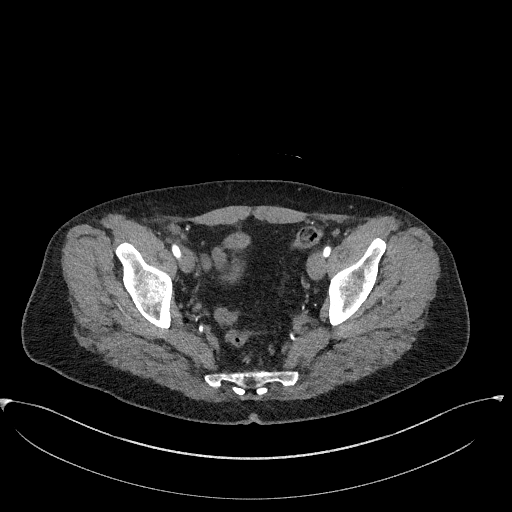
[im 87/225  soft-tissue]
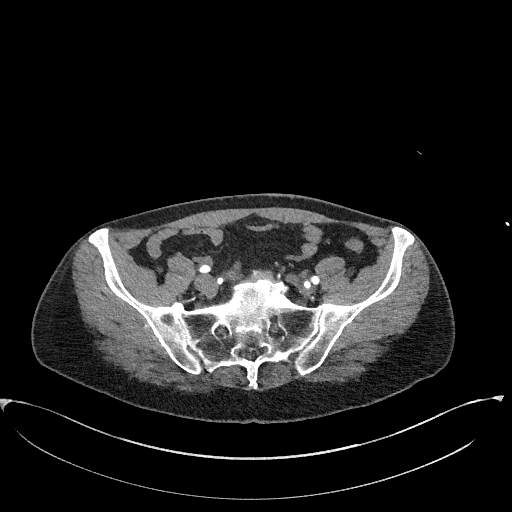
[im 102/225  soft-tissue]
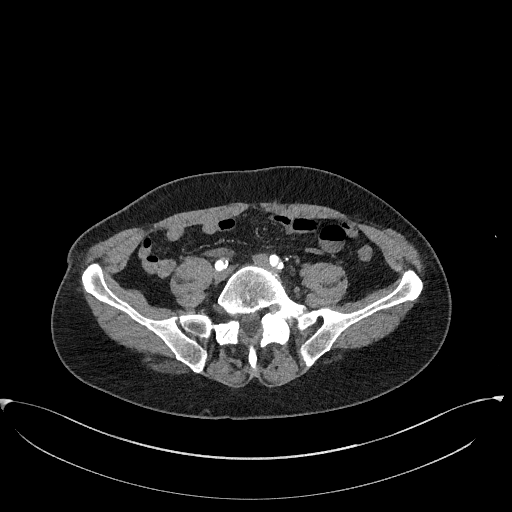
[im 123/225  soft-tissue]
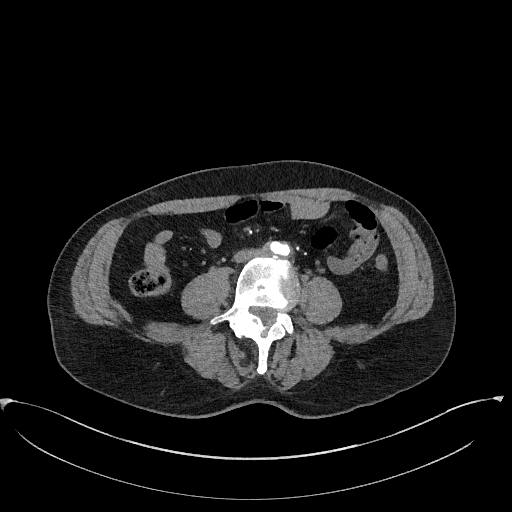
[im 138/225  soft-tissue]
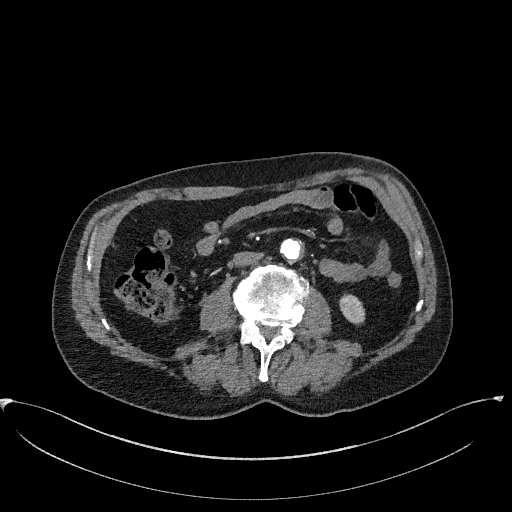
[im 159/225  soft-tissue]
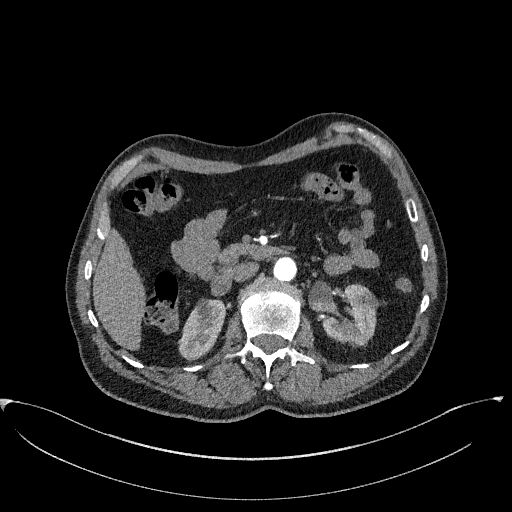
[im 159/225  bone]
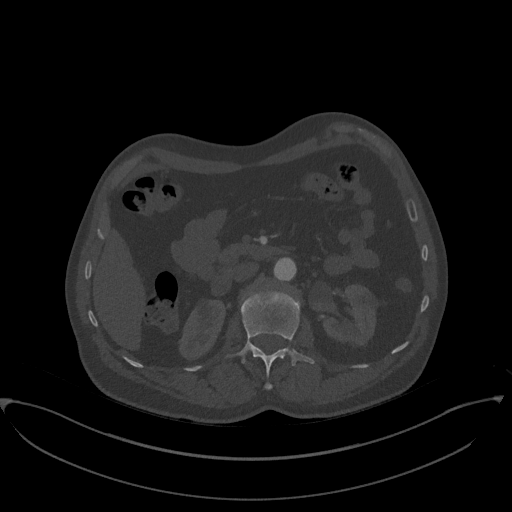
[im 174/225  soft-tissue]
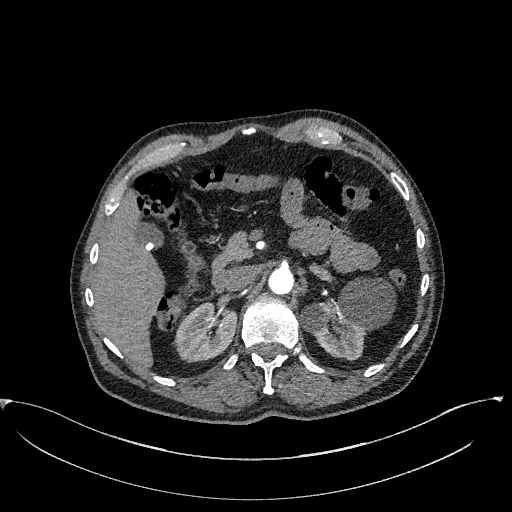
[im 196/225  soft-tissue]
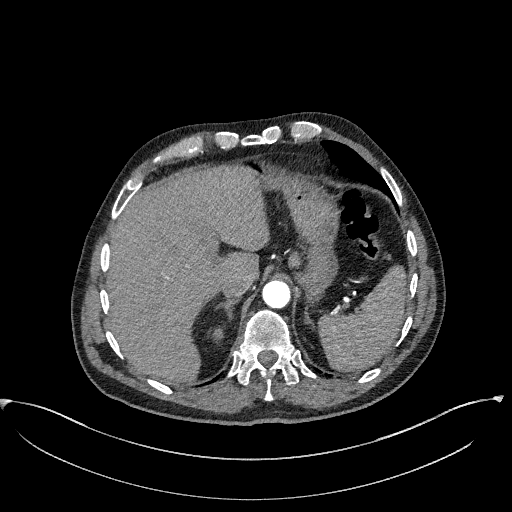
[im 210/225  soft-tissue]
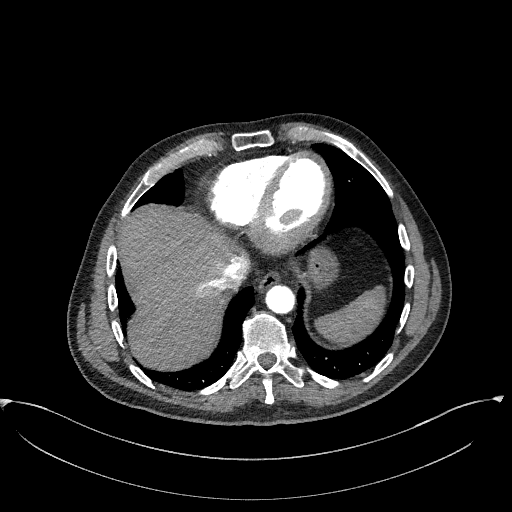

[Series 8: dissection 2mm cor · coronal · 0.88mm/px · 3 of 141 slices shown]
[im 36/141  soft-tissue]
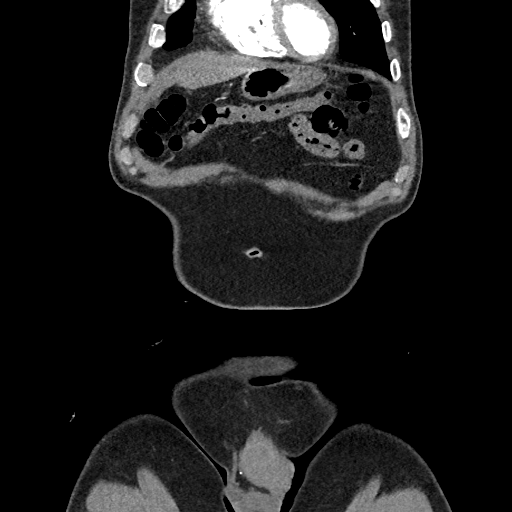
[im 71/141  soft-tissue]
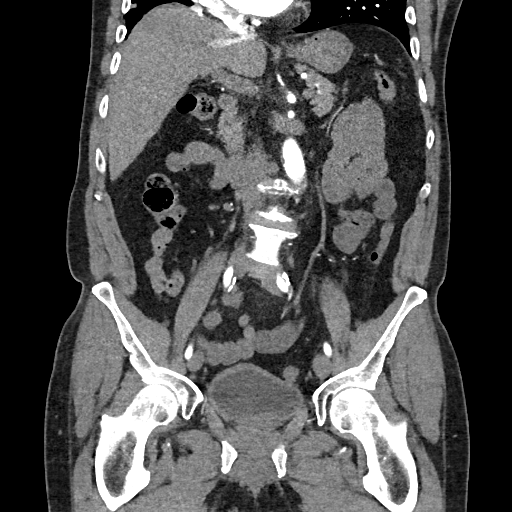
[im 106/141  soft-tissue]
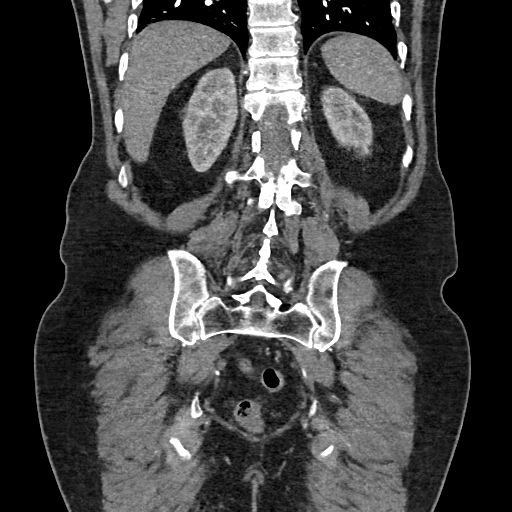

[15 of 46 positions shown; findings below may reference images not displayed]

FINDINGS: VASCULAR

Aorta: Stable atherosclerosis of the distal abdominal aorta
consisting primarily of noncalcified plaque with potential component
of a mild amount of mural thrombus. Some of the soft plaque appears
ulcerated. No evidence of aneurysm, dissection or penetrating ulcer
disease of the aorta. No evidence of vasculitis.

Celiac: Normally patent. Normally patent distal branch vessels and
branching anatomy.

SMA: Normally patent.

Renals: There are 2 separate right renal arteries with tiny lower
pole accessory artery present. The larger right renal artery
demonstrates beaded appearance in its mid to distal segment
consistent with fibromuscular dysplasia. Single left renal artery is
normally patent. The distal aspect of the left renal artery is
mildly irregular in appearance which may be consistent with
underlying fibromuscular dysplasia. No significant focal renal
artery stenosis bilaterally.

IMA: Patent.

Inflow: Atherosclerosis again identified of both common iliac
arteries. Compared to the prior study, there is new irregularity
along the superior and lateral luminal aspect of the proximal to mid
right common iliac artery having the appearance of ulcerated plaque
and potentially also penetrating ulcer. This is not associated with
aneurysmal disease. No significant obstructive disease of the iliac
arteries.

Proximal Outflow: Common femoral arteries and femoral bifurcations
are normally patent.

Review of the MIP images confirms the above findings.

NON-VASCULAR

Lower chest: No acute abnormality.

Hepatobiliary: No focal liver abnormality is seen. Stable calcified
gallstones in a nondistended gallbladder. No biliary dilatation.

Pancreas: Unremarkable. No pancreatic ductal dilatation or
surrounding inflammatory changes.

Spleen: Normal in size without focal abnormality.

Adrenals/Urinary Tract: Normal adrenal glands. Stable bilateral
renal cysts. No hydronephrosis or renal masses. Unremarkable
appearance of bladder.

Stomach/Bowel: Bowel shows no evidence of obstruction, ileus or
inflammation. No free intraperitoneal air identified.

Lymphatic: No enlarged lymph nodes identified in the abdomen or
pelvis.

Reproductive: Prostate is unremarkable.

Other: No abdominal wall hernia or abnormality. No abdominopelvic
ascites.

Musculoskeletal: Stable degenerative disc disease of the lumbar
spine.
IMPRESSION: 1. Stable atherosclerosis of the distal abdominal aorta with some
ulcerated plaque present.
2. New irregularity along the superior and lateral luminal aspect of
the proximal to mid right common iliac artery having the appearance
of ulcerated plaque and potentially also penetrating ulcer. This is
not associated with aneurysmal disease.
3. Evidence of probable fibromuscular dysplasia involving bilateral
renal arteries, right greater than left.
4. Stable cholelithiasis without evidence of cholecystitis.

## 2021-05-14 IMAGING — CT CT CTA ABD/PEL W/CM AND/OR W/O CM
2 of 9 series · 12 of 46 positions shown, 17 images · IV contrast (OMNIPAQUE 350)
Comparison: CT of the abdomen and pelvis 03/29/2019

CLINICAL DATA: Lower abdominal pain. Iliac artery ulceration.

EXAM:
CTA ABDOMEN AND PELVIS WITHOUT AND WITH CONTRAST
TECHNIQUE: Multidetector CT imaging of the abdomen and pelvis was performed
using the standard protocol during bolus administration of
intravenous contrast. Multiplanar reconstructed images and MIPs were
obtained and reviewed to evaluate the vascular anatomy.
CONTRAST:  100mL OMNIPAQUE IOHEXOL 350 MG/ML SOLN

[Series 8: coronal mpr · coronal · 0.65mm/px · 2 of 129 slices shown]
[im 43/129  soft-tissue]
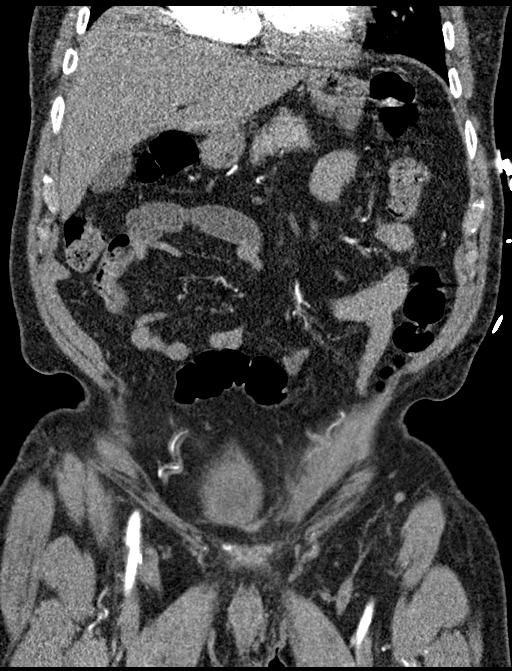
[im 86/129  soft-tissue]
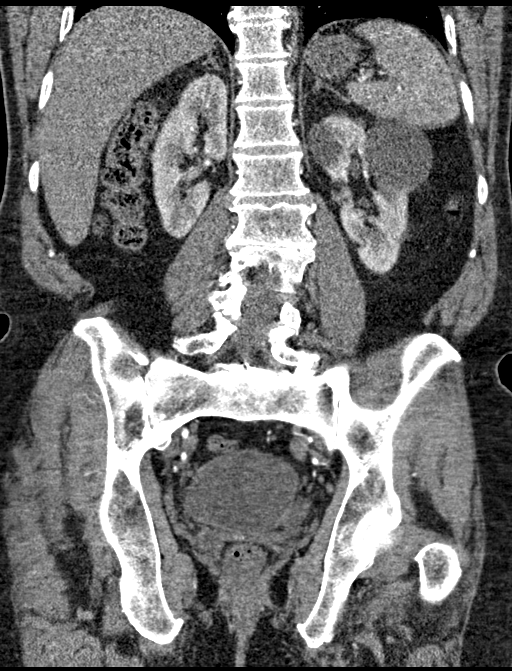

[Series 12: axial venous · axial · portal-venous · 0.70mm/px · z∈[+1137,+1509]mm · 10 of 220 slices shown, 15 images]
[im 17/220  soft-tissue]
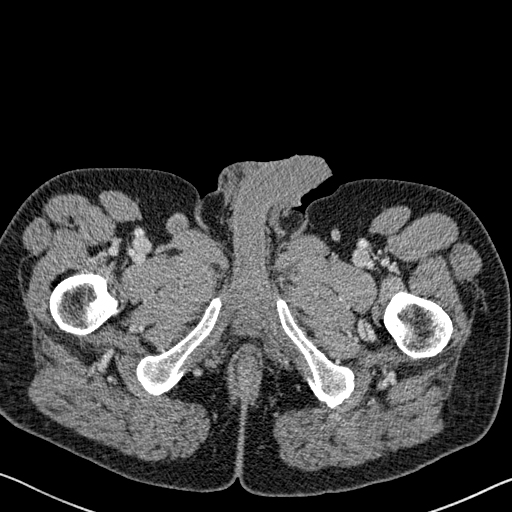
[im 17/220  bone]
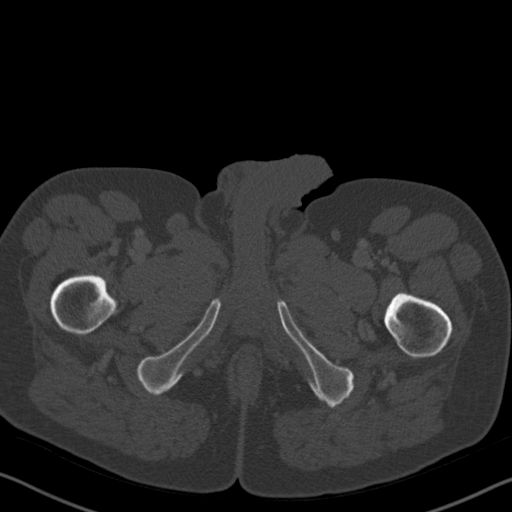
[im 51/220  soft-tissue]
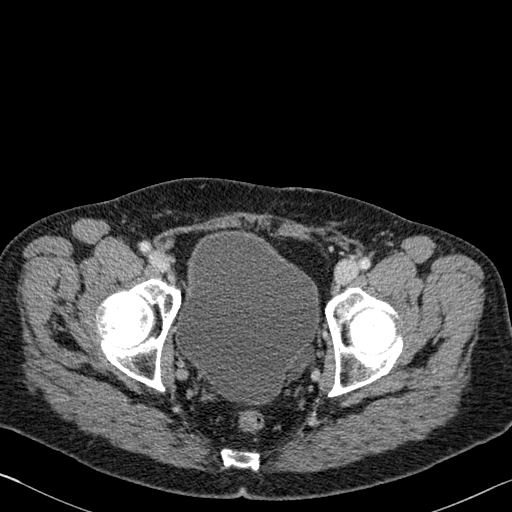
[im 68/220  soft-tissue]
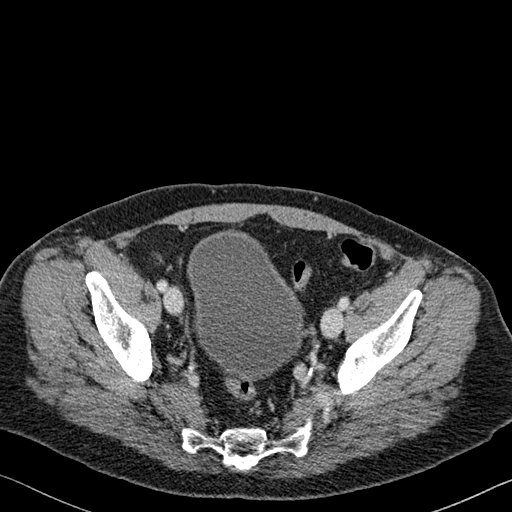
[im 85/220  soft-tissue]
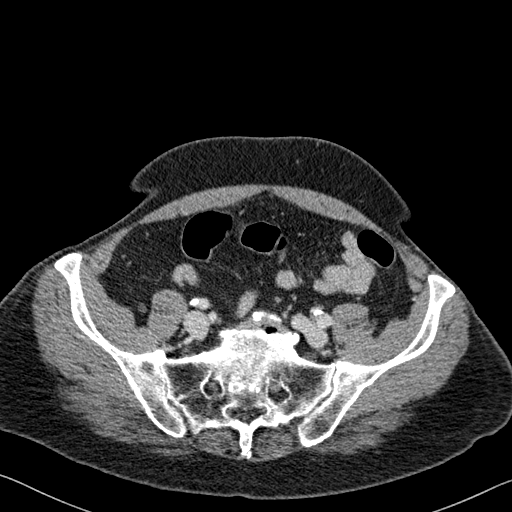
[im 118/220  soft-tissue]
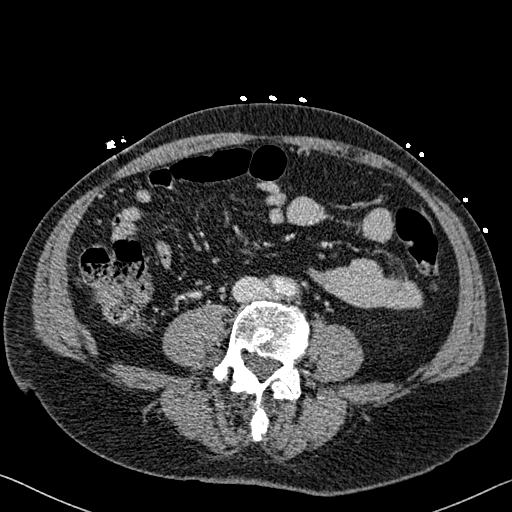
[im 135/220  soft-tissue]
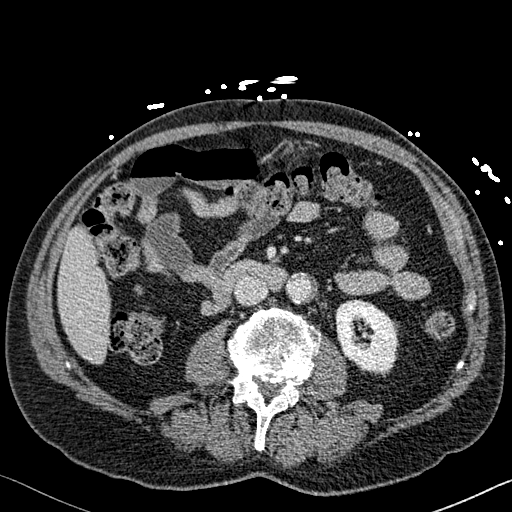
[im 152/220  soft-tissue]
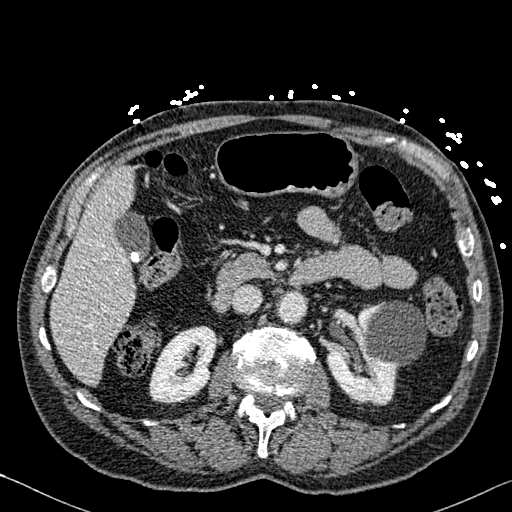
[im 152/220  lung]
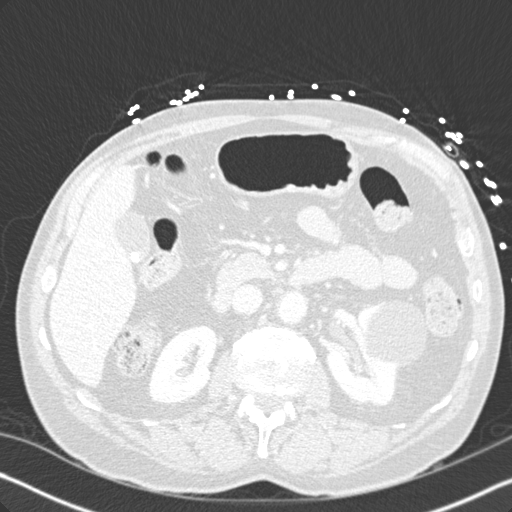
[im 169/220  lung]
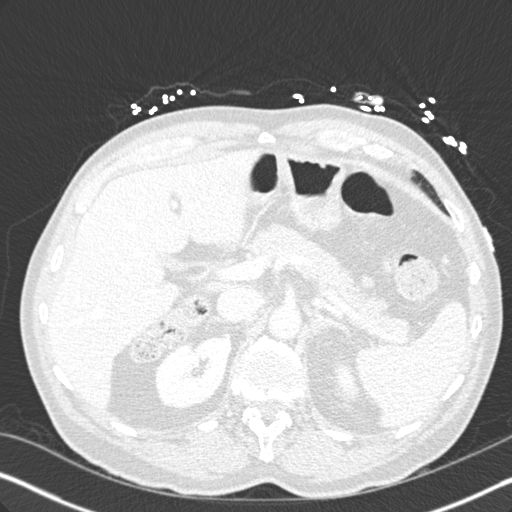
[im 186/220  soft-tissue]
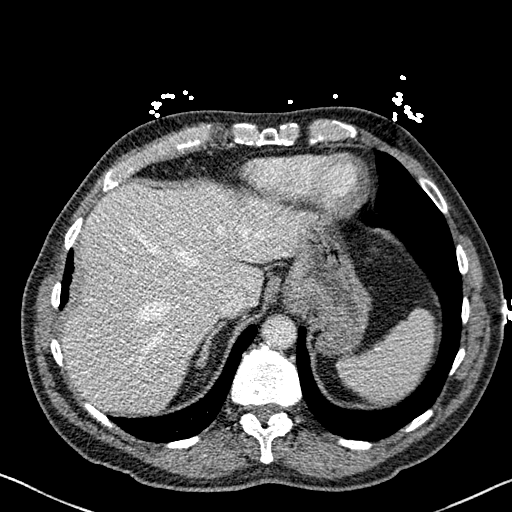
[im 186/220  lung]
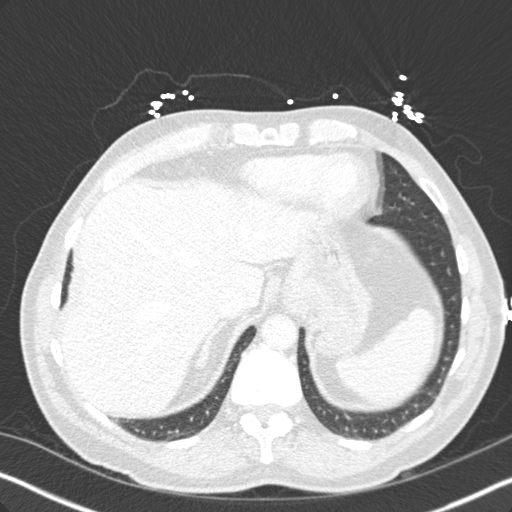
[im 203/220  soft-tissue]
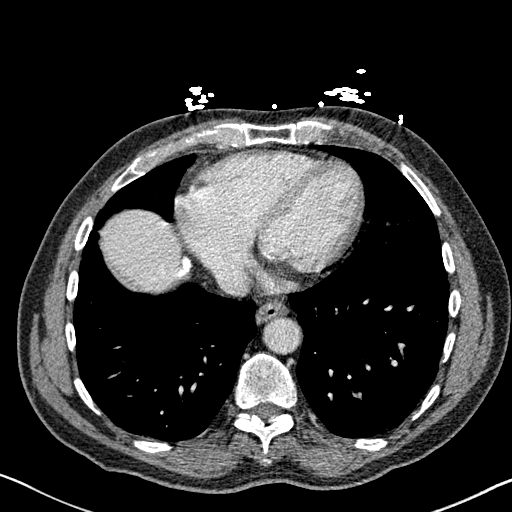
[im 203/220  lung]
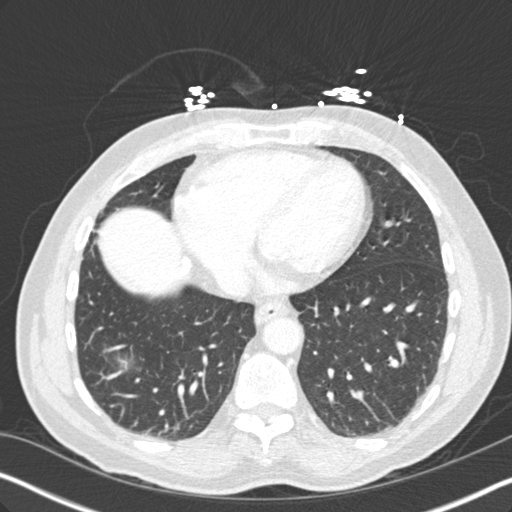
[im 203/220  bone]
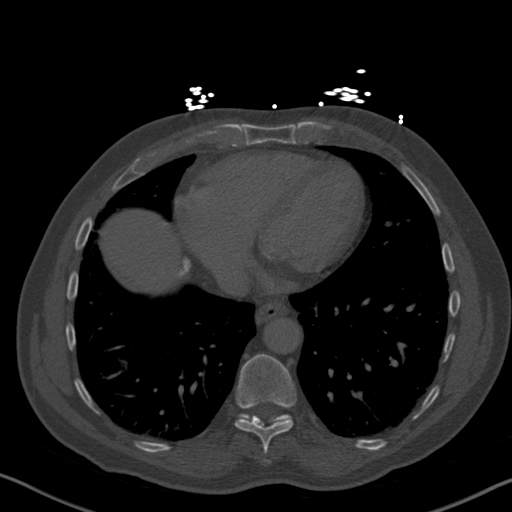

[12 of 46 positions shown; findings below may reference images not displayed]

FINDINGS: VASCULAR

Aorta: Atherosclerotic changes are present in the infrarenal
abdominal aorta. Calcified noncalcified plaque is most prominent
just above the bifurcation. There is no aneurysm. Mild ulceration of
the soft plaque is similar the prior study.

Celiac: Mild atherosclerotic calcification and narrowing is present
at the celiac origin. There is no significant stenosis. Branch
vessels are within normal limits.

SMA: Mild atherosclerotic calcifications are present. No significant
stenosis is present.

Renals: Duplicated right renal artery is again noted. No significant
stenosis is evident.

IMA: Patent

Inflow: Atherosclerotic changes are again noted throughout the
common iliac arteries bilaterally. Posterolateral ulceration of the
right common iliac artery is stable. Diffuse irregularity is present
without significant stenosis or other focal ulceration or change.

Proximal Outflow: Atherosclerotic calcifications are present without
significant stenosis or aneurysm.

Veins: Patent

Review of the MIP images confirms the above findings.

NON-VASCULAR

Lower chest: The lung bases are clear without focal nodule, mass, or
airspace disease. Heart is mildly enlarged. Calcifications are
present within the coronary arteries.

Hepatobiliary: No focal patent lesions are present. Layering
gallstones are again seen without inflammatory change. The common
bile duct is within normal limits.

Pancreas: Unremarkable. No pancreatic ductal dilatation or
surrounding inflammatory changes.

Spleen: Normal in size without focal abnormality.

Adrenals/Urinary Tract: Adrenal glands are normal bilaterally.
Bilateral renal cysts are stable. The urinary bladder is normal.
Ureters are unremarkable.

Stomach/Bowel: Stomach and duodenum are within normal limits. Small
bowel is unremarkable. The appendix is visualized and normal.
Ascending and transverse colon are within normal limits. The
descending and sigmoid colon are normal.

Lymphatic: No significant retroperitoneal adenopathy is present.

Reproductive: Prostate gland is mildly enlarged, stable

Other: No abdominal wall hernia or abnormality. No abdominopelvic
ascites.

Musculoskeletal: Moderate degenerative changes are evident within
the lumbar spine. There is chronic loss of disc height at L1-2 and
to a greater extent at L2-3. No focal lytic or blastic lesions are
present. Bony pelvis is within normal limits. The hips are located
and within normal limits.
IMPRESSION: 1. Stable appearance of extensive atherosclerotic disease without
significant stenosis or aneurysm.
2. Stable eccentric ulceration of the right common iliac artery.
3. No acute or focal lesion to explain the patient's lower abdominal
pain.
4. Cholelithiasis without cholecystitis.
5. Stable bilateral renal cysts.
6.  Aortic Atherosclerosis ([id number]-[id number]).
7. Multilevel degenerative changes in the lumbar spine.

## 2021-06-17 NOTE — Progress Notes (Signed)
HPI: Follow-up history of mitral valve repair secondary to mitral valve prolapse mitral regurgitation, paroxysmal atrial fibrillation (postoperative), hypertension and hyperlipidemia. Previously followed by Dr. Acie Fredrickson. Patient had mitral valve repair in 2012. Note preoperative cardiac catheterization March 2012 showed no coronary disease. CTA November 2020 showed aortic atherosclerosis but no aneurysm. Echocardiogram September 2022 showed normal LV function, basal septal hypertrophy, mild left atrial enlargement, prosthetic annuloplasty ring with no mitral regurgitation and mild to moderate mitral stenosis with mean gradient 6 mmHg, mild to moderate tricuspid regurgitation.  Since last seen there is no dyspnea, chest pain, palpitations or syncope.  Current Outpatient Medications  Medication Sig Dispense Refill   amLODipine (NORVASC) 5 MG tablet Take 5 mg by mouth daily.      amoxicillin (AMOXIL) 500 MG capsule Take 2,000 mg by mouth See admin instructions. Only takes when has dental work done     aspirin 81 MG tablet Take 81 mg by mouth daily.       Coenzyme Q10 (CO Q 10) 100 MG CAPS Take 1 tablet by mouth every morning.      fluticasone (FLONASE) 50 MCG/ACT nasal spray Place 2 sprays into both nostrils as needed.     levothyroxine (SYNTHROID) 100 MCG tablet Take 100 mcg by mouth daily before breakfast.     LUTEIN-ZEAXANTHIN PO Take 20 mg by mouth daily.      metoprolol (TOPROL-XL) 100 MG 24 hr tablet Take 100 mg by mouth daily.       pravastatin (PRAVACHOL) 40 MG tablet Take by mouth.     Simethicone 125 MG TABS Take 1 tablet by mouth daily as needed (gas).      triamcinolone cream (KENALOG) 0.1 % Apply 1 application topically as needed.     valsartan (DIOVAN) 160 MG tablet Take 1 tablet (160 mg total) by mouth daily. 90 tablet 3   COVID-19 mRNA Vac-TriS, Pfizer, SUSP injection Inject into the muscle. 0.3 mL 0   No current facility-administered medications for this visit.     Past  Medical History:  Diagnosis Date   Arthritis    Atrial fibrillation (HCC)    CHF (congestive heart failure) (HCC) CHRONIC SYSTOLIC WITH ACUTE EXACERBATIONS   GERD (gastroesophageal reflux disease)    Heart murmur    Hyperlipemia    Hypertension    MVP (mitral valve prolapse) WITH SEVERE MITRAL REGURG   Nephrolithiasis    Palpitations WITH PREMATURE VENTRICULAR COTRACTIONS AND PREMATURE ATRIAL CONTRACTIONS   Rheumatic fever REPORTED HISTORY   Sensorineural hearing loss    Varicose veins    Weakness of right upper extremity RELATED TO RUPTURED BICEPS TENDON    Past Surgical History:  Procedure Laterality Date   HEMORRHOID SURGERY     HERNIA REPAIR     HYDROCELE EXCISION / REPAIR Left    INGUINAL HERNIA REPAIR Right    Dr. Deon Pilling   MITRAL VALVE REPAIR  08/19/2010   Dr. Ricard Dillon.  complex valvuloplasty with 56mm ring annuloplasty via right mini thoracotomy (Dr. Roxy Manns)   Multiple surgical procedures in his use for     ROTATOR CUFF REPAIR     The patient has also had     TONSILLECTOMY     URETER ECTOPIC RESECTION Left     Social History   Socioeconomic History   Marital status: Married    Spouse name: Not on file   Number of children: 0   Years of education: Not on file   Highest education level: Not on  file  Occupational History   Not on file  Tobacco Use   Smoking status: Former    Types: Cigarettes    Quit date: 08/12/1994    Years since quitting: 26.9   Smokeless tobacco: Never  Vaping Use   Vaping Use: Never used  Substance and Sexual Activity   Alcohol use: No   Drug use: No   Sexual activity: Not on file  Other Topics Concern   Not on file  Social History Narrative   Not on file   Social Determinants of Health   Financial Resource Strain: Not on file  Food Insecurity: Not on file  Transportation Needs: Not on file  Physical Activity: Not on file  Stress: Not on file  Social Connections: Not on file  Intimate Partner Violence: Not on file    Family  History  Problem Relation Age of Onset   Heart attack Mother    Angina Mother    Hypertension Mother    Dementia Mother    Seizures Father    Hypertension Sister    Hypertension Brother    Colon cancer Neg Hx     ROS: no fevers or chills, productive cough, hemoptysis, dysphasia, odynophagia, melena, hematochezia, dysuria, hematuria, rash, seizure activity, orthopnea, PND, pedal edema, claudication. Remaining systems are negative.  Physical Exam: Well-developed well-nourished in no acute distress.  Skin is warm and dry.  HEENT is normal.  Neck is supple.  Chest is clear to auscultation with normal expansion.  Cardiovascular exam is regular rate and rhythm.  Abdominal exam nontender or distended. No masses palpated. Extremities show no edema. neuro grossly intact   A/P  1 history of mitral valve repair-continue SBE prophylaxis.  2 hypertension-blood pressure elevated; however he follows this closely at home and it is typically controlled.  Continue present medications and follow.  3 hyperlipidemia-continue statin.  We will have most recent lipids and liver forwarded to [place] from primary care.  4 palpitations-symptoms have improved compared to previous.  5 aortic atherosclerosis-continue medical therapy including aspirin and statin.  [name], MD

## 2021-06-23 DIAGNOSIS — K648 Other hemorrhoids: Secondary | ICD-10-CM | POA: Diagnosis not present

## 2021-06-30 ENCOUNTER — Encounter: Payer: Self-pay | Admitting: Cardiology

## 2021-06-30 ENCOUNTER — Other Ambulatory Visit: Payer: Self-pay

## 2021-06-30 ENCOUNTER — Ambulatory Visit: Payer: Medicare HMO | Admitting: Cardiology

## 2021-06-30 VITALS — BP 150/86 | HR 58 | Ht 67.0 in | Wt 178.4 lb

## 2021-06-30 DIAGNOSIS — I10 Essential (primary) hypertension: Secondary | ICD-10-CM | POA: Diagnosis not present

## 2021-06-30 DIAGNOSIS — E785 Hyperlipidemia, unspecified: Secondary | ICD-10-CM

## 2021-06-30 DIAGNOSIS — Z9889 Other specified postprocedural states: Secondary | ICD-10-CM | POA: Diagnosis not present

## 2021-06-30 DIAGNOSIS — R002 Palpitations: Secondary | ICD-10-CM

## 2021-06-30 DIAGNOSIS — I700 Atherosclerosis of aorta: Secondary | ICD-10-CM

## 2021-06-30 NOTE — Patient Instructions (Signed)

## 2021-07-02 ENCOUNTER — Encounter: Payer: Self-pay | Admitting: Cardiology

## 2021-07-02 MED ORDER — AMLODIPINE BESYLATE 10 MG PO TABS: 10.0000 mg | ORAL_TABLET | Freq: Every day | ORAL | 3 refills | Status: DC

## 2021-07-02 NOTE — Addendum Note (Signed)
Addended by: Rexanne Mano B on: 07/02/2021 03:20 PM   Modules accepted: Orders

## 2021-07-06 DIAGNOSIS — E785 Hyperlipidemia, unspecified: Secondary | ICD-10-CM | POA: Diagnosis not present

## 2021-07-06 DIAGNOSIS — Z125 Encounter for screening for malignant neoplasm of prostate: Secondary | ICD-10-CM | POA: Diagnosis not present

## 2021-07-06 DIAGNOSIS — I10 Essential (primary) hypertension: Secondary | ICD-10-CM | POA: Diagnosis not present

## 2021-07-06 DIAGNOSIS — E291 Testicular hypofunction: Secondary | ICD-10-CM | POA: Diagnosis not present

## 2021-07-13 DIAGNOSIS — I10 Essential (primary) hypertension: Secondary | ICD-10-CM | POA: Diagnosis not present

## 2021-07-13 DIAGNOSIS — I480 Paroxysmal atrial fibrillation: Secondary | ICD-10-CM | POA: Diagnosis not present

## 2021-07-13 DIAGNOSIS — Z1331 Encounter for screening for depression: Secondary | ICD-10-CM | POA: Diagnosis not present

## 2021-07-13 DIAGNOSIS — E291 Testicular hypofunction: Secondary | ICD-10-CM | POA: Diagnosis not present

## 2021-07-13 DIAGNOSIS — E785 Hyperlipidemia, unspecified: Secondary | ICD-10-CM | POA: Diagnosis not present

## 2021-07-13 DIAGNOSIS — E038 Other specified hypothyroidism: Secondary | ICD-10-CM | POA: Diagnosis not present

## 2021-07-13 DIAGNOSIS — M353 Polymyalgia rheumatica: Secondary | ICD-10-CM | POA: Diagnosis not present

## 2021-07-13 DIAGNOSIS — Z0000 Encounter for general adult medical examination without abnormal findings: Secondary | ICD-10-CM | POA: Diagnosis not present

## 2021-07-13 DIAGNOSIS — Z1212 Encounter for screening for malignant neoplasm of rectum: Secondary | ICD-10-CM | POA: Diagnosis not present

## 2021-07-13 DIAGNOSIS — K8020 Calculus of gallbladder without cholecystitis without obstruction: Secondary | ICD-10-CM | POA: Diagnosis not present

## 2021-07-13 DIAGNOSIS — I700 Atherosclerosis of aorta: Secondary | ICD-10-CM | POA: Diagnosis not present

## 2021-07-13 DIAGNOSIS — R82998 Other abnormal findings in urine: Secondary | ICD-10-CM | POA: Diagnosis not present

## 2021-07-13 DIAGNOSIS — Z1339 Encounter for screening examination for other mental health and behavioral disorders: Secondary | ICD-10-CM | POA: Diagnosis not present

## 2021-07-13 DIAGNOSIS — I8390 Asymptomatic varicose veins of unspecified lower extremity: Secondary | ICD-10-CM | POA: Diagnosis not present

## 2021-07-28 DIAGNOSIS — K648 Other hemorrhoids: Secondary | ICD-10-CM | POA: Diagnosis not present

## 2021-09-02 DIAGNOSIS — E038 Other specified hypothyroidism: Secondary | ICD-10-CM | POA: Diagnosis not present

## 2021-09-08 DIAGNOSIS — K648 Other hemorrhoids: Secondary | ICD-10-CM | POA: Diagnosis not present

## 2021-09-16 DIAGNOSIS — I8312 Varicose veins of left lower extremity with inflammation: Secondary | ICD-10-CM | POA: Diagnosis not present

## 2021-09-16 DIAGNOSIS — B351 Tinea unguium: Secondary | ICD-10-CM | POA: Diagnosis not present

## 2021-09-16 DIAGNOSIS — D225 Melanocytic nevi of trunk: Secondary | ICD-10-CM | POA: Diagnosis not present

## 2021-09-16 DIAGNOSIS — L918 Other hypertrophic disorders of the skin: Secondary | ICD-10-CM | POA: Diagnosis not present

## 2021-09-16 DIAGNOSIS — L570 Actinic keratosis: Secondary | ICD-10-CM | POA: Diagnosis not present

## 2021-09-16 DIAGNOSIS — L814 Other melanin hyperpigmentation: Secondary | ICD-10-CM | POA: Diagnosis not present

## 2021-09-16 DIAGNOSIS — I8311 Varicose veins of right lower extremity with inflammation: Secondary | ICD-10-CM | POA: Diagnosis not present

## 2021-09-16 DIAGNOSIS — L821 Other seborrheic keratosis: Secondary | ICD-10-CM | POA: Diagnosis not present

## 2021-09-16 DIAGNOSIS — I872 Venous insufficiency (chronic) (peripheral): Secondary | ICD-10-CM | POA: Diagnosis not present

## 2021-11-24 DIAGNOSIS — E038 Other specified hypothyroidism: Secondary | ICD-10-CM | POA: Diagnosis not present

## 2021-11-24 DIAGNOSIS — E785 Hyperlipidemia, unspecified: Secondary | ICD-10-CM | POA: Diagnosis not present

## 2022-01-13 ENCOUNTER — Other Ambulatory Visit: Payer: Self-pay | Admitting: Physician Assistant

## 2022-03-08 DIAGNOSIS — H52223 Regular astigmatism, bilateral: Secondary | ICD-10-CM | POA: Diagnosis not present

## 2022-03-08 DIAGNOSIS — H5213 Myopia, bilateral: Secondary | ICD-10-CM | POA: Diagnosis not present

## 2022-03-08 DIAGNOSIS — Z135 Encounter for screening for eye and ear disorders: Secondary | ICD-10-CM | POA: Diagnosis not present

## 2022-03-08 DIAGNOSIS — H2513 Age-related nuclear cataract, bilateral: Secondary | ICD-10-CM | POA: Diagnosis not present

## 2022-03-08 DIAGNOSIS — H524 Presbyopia: Secondary | ICD-10-CM | POA: Diagnosis not present

## 2022-03-16 ENCOUNTER — Encounter: Payer: Self-pay | Admitting: Cardiology

## 2022-03-17 ENCOUNTER — Encounter: Payer: Self-pay | Admitting: Cardiology

## 2022-03-20 NOTE — Progress Notes (Unsigned)
Cardiology Clinic Note   Patient Name: [name] Date of Encounter: 03/21/2022  Primary Care Provider:  Donnajean Lopes, MD Primary Cardiologist:  Scott Ruths, MD  Patient Profile    [name] 71 year old male presents the clinic today for follow-up evaluation of his mitral valve repair, palpitations, and essential hypertension.  Past Medical History    Past Medical History:  Diagnosis Date   Arthritis    Atrial fibrillation (HCC)    CHF (congestive heart failure) (HCC) CHRONIC SYSTOLIC WITH ACUTE EXACERBATIONS   GERD (gastroesophageal reflux disease)    Heart murmur    Hyperlipemia    Hypertension    MVP (mitral valve prolapse) WITH SEVERE MITRAL REGURG   Nephrolithiasis    Palpitations WITH PREMATURE VENTRICULAR COTRACTIONS AND PREMATURE ATRIAL CONTRACTIONS   Rheumatic fever REPORTED HISTORY   Sensorineural hearing loss    Varicose veins    Weakness of right upper extremity RELATED TO RUPTURED BICEPS TENDON   Past Surgical History:  Procedure Laterality Date   HEMORRHOID SURGERY     HERNIA REPAIR     HYDROCELE EXCISION / REPAIR Left    INGUINAL HERNIA REPAIR Right    Dr. Deon [name]   MITRAL VALVE REPAIR  08/19/2010   Dr. Ricard [name].  complex valvuloplasty with 70m ring annuloplasty via right mini thoracotomy (Dr. ORoxy [name]   Multiple surgical procedures in his use for     ROTATOR CUFF REPAIR     The patient has also had     TONSILLECTOMY     URETER ECTOPIC RESECTION Left     Allergies  Allergies  Allergen Reactions   Atenolol Other (See Comments)    Stated doctor told him he was allergic. Stated doctor told him he was allergic. Unsure of reaction   Nadolol Other (See Comments)    Stated doctor told him he was allergic    Statins Other (See Comments)    Increase liver enzymes   Fentanyl And Related Hives, Itching, Nausea And Vomiting, Anxiety and Palpitations   Hydromorphone Anxiety, Hives, Itching and Palpitations    History of Present Illness     [name] a PMH of mitral valve prolapse status post mitral valve repair, paroxysmal A-fib postoperatively, HTN, and HLD.  He was previously followed by Dr. NKatharina [name]  He underwent mitral valve repair in 2012.  He had a cardiac catheterization postoperatively 3/12 which showed no coronary disease.  His coronary CTA 11/20 showed aortic atherosclerosis but no aneurysm.  His echocardiogram 9/22 showed normal LV function, basal septal hypertrophy, mild left atrial enlargement, prosthetic annuloplasty ring with no mitral regurgitation, mild-moderate mitral stenosis with a mean gradient of 6 mmHg, mild-moderate tricuspid regurgitation.  He was seen in follow-up by Dr. CStanford [name] 06/30/2021.  During that time he denied chest pain, palpitations, syncope and dyspnea.  He presents to the clinic today for follow-up evaluation and states he feels well today.  He notes that he was having more palpitations that he believes were PVCs after visiting his primary care doctor.  His primary concern at that time was decreased blood pressure.  His amlodipine was decreased to 5 mg daily.  His TSH was also decreased.  He increased his amlodipine back to 10 mg daily and has not had any further episodes of palpitations.  He indicated that the episodes were also more prominent in the summer when he was working outside in the clinic.  We reviewed the importance of staying well-hydrated and avoiding extreme temperatures.  He expressed understanding.  I will continue his current medication regimen, have him keep his follow-up appointment in February and maintain his physical activity and diet.  Today he denies chest pain, shortness of breath, lower extremity edema, fatigue, palpitations, melena, hematuria, hemoptysis, diaphoresis, weakness, presyncope, syncope, orthopnea, and PND.      Home Medications    Prior to Admission medications   Medication Sig Start Date End Date Taking? Authorizing Provider  amLODipine  (NORVASC) 10 MG tablet Take 1 tablet (10 mg total) by mouth daily. 07/02/21   Scott Perla, MD  amoxicillin (AMOXIL) 500 MG capsule Take 2,000 mg by mouth See admin instructions. Only takes when has dental work done 12/27/14   [provider]  aspirin 81 MG tablet Take 81 mg by mouth daily.      [provider]  Coenzyme Q10 (CO Q 10) 100 MG CAPS Take 1 tablet by mouth every morning.     [provider]  COVID-19 mRNA Vac-TriS, Pfizer, SUSP injection Inject into the muscle. 11/04/20   Scott Basques, MD  fluticasone (FLONASE) 50 MCG/ACT nasal spray Place 2 sprays into both nostrils as needed. 01/12/21   [provider]  levothyroxine (SYNTHROID) 100 MCG tablet Take 100 mcg by mouth daily before breakfast.    [provider]  LUTEIN-ZEAXANTHIN PO Take 20 mg by mouth daily.     [provider]  metoprolol (TOPROL-XL) 100 MG 24 hr tablet Take 100 mg by mouth daily.      [provider]  pravastatin (PRAVACHOL) 40 MG tablet Take by mouth.    [provider]  Simethicone 125 MG TABS Take 1 tablet by mouth daily as needed (gas).     [provider]  triamcinolone cream (KENALOG) 0.1 % Apply 1 application topically as needed. 11/06/18   [provider]  valsartan (DIOVAN) 160 MG tablet TAKE 1 TABLET BY MOUTH EVERY DAY 01/13/22   Scott Perla, MD    Family History    Family History  Problem Relation Age of Onset   Heart attack Mother    Angina Mother    Hypertension Mother    Dementia Mother    Seizures Father    Hypertension Sister    Hypertension Brother    Colon cancer Neg Hx    He indicated that his mother is deceased. He indicated that his father is deceased. He indicated that his sister is alive. He indicated that his brother is alive. He indicated that the status of his neg hx is unknown.  Social History    Social History   Socioeconomic History   Marital status: Married    Spouse name: Not  on file   Number of children: 0   Years of education: Not on file   Highest education level: Not on file  Occupational History   Not on file  Tobacco Use   Smoking status: Former    Types: Cigarettes    Quit date: 08/12/1994    Years since quitting: 27.6   Smokeless tobacco: Never  Vaping Use   Vaping Use: Never used  Substance and Sexual Activity   Alcohol use: No   Drug use: No   Sexual activity: Not on file  Other Topics Concern   Not on file  Social History Narrative   Not on file   Social Determinants of Health   Financial Resource Strain: Not on file  Food Insecurity: Not on file  Transportation Needs: Not on file  Physical Activity: Not on file  Stress: Not on file  Social Connections: Not on file  Intimate Partner Violence: Not on file     Review of Systems    General:  No chills, fever, night sweats or weight changes.  Cardiovascular:  No chest pain, dyspnea on exertion, edema, orthopnea, palpitations, paroxysmal nocturnal dyspnea. Dermatological: No rash, lesions/masses Respiratory: No cough, dyspnea Urologic: No hematuria, dysuria Abdominal:   No nausea, vomiting, diarrhea, bright red blood per rectum, melena, or hematemesis Neurologic:  No visual changes, wkns, changes in mental status. All other systems reviewed and are otherwise negative except as noted above.  Physical Exam    VS:  BP 134/84   Pulse (!) 57   Ht '5\' 7"'$  (1.702 m)   Wt 174 lb 12.8 oz (79.3 kg)   SpO2 96%   BMI 27.38 kg/m  , BMI Body mass index is 27.38 kg/m. GEN: Well nourished, well developed, in no acute distress. HEENT: normal. Neck: Supple, no JVD, carotid bruits, or masses. Cardiac: RRR, no murmurs, rubs, or gallops. No clubbing, cyanosis, edema.  Radials/DP/PT 2+ and equal bilaterally.  Respiratory:  Respirations regular and unlabored, clear to auscultation bilaterally. GI: Soft, nontender, nondistended, BS + x 4. MS: no deformity or atrophy. Skin: warm and dry, no  rash. Neuro:  Strength and sensation are intact. Psych: Normal affect.  Accessory Clinical Findings    Recent Labs: No results found for requested labs within last 365 days.   Recent Lipid Panel No results found for: "CHOL", "TRIG", "HDL", "CHOLHDL", "VLDL", "LDLCALC", "LDLDIRECT"       ECG personally reviewed by me today-sinus bradycardia early repolarization 57 bpm- No acute changes  Echocardiogram 03/12/2021 IMPRESSIONS     1. Left ventricular ejection fraction, by estimation, is 60 to 65%. The  left ventricle has normal function. The left ventricle has no regional  wall motion abnormalities. There is moderate asymmetric left ventricular  hypertrophy of the basal-septal  segment. Left ventricular diastolic parameters are indeterminate. The  average left ventricular global longitudinal strain is -23.3 %.   2. Right ventricular systolic function is normal. The right ventricular  size is normal. There is mildly elevated pulmonary artery systolic  pressure.   3. Left atrial size was mildly dilated.   4. There is a prosthetic annuloplasty ring present in the mitral  position.      No evidence of mitral valve regurgitation. Mild to moderate mitral  stenosis. Moderate MS by gradients (MG 6 mmHg at HR 61bpm) but MVA 2.2  cm^2 by continuity equation suggesting mild MS   5. Tricuspid valve regurgitation is mild to moderate.   6. The aortic valve is tricuspid. Aortic valve regurgitation is not  visualized. No aortic stenosis is present.   Assessment & Plan   1.  Mitral valve prolapse-status post mitral valve repair 2012.  No increased DOE or activity intolerance.  Echocardiogram 9/22 showed mild-moderate mitral stenosis with a mean gradient of 6 mmHg. Continue to monitor Continue SBE prophylaxis  Palpitations-denies recent episodes. Avoid triggers caffeine, chocolate, EtOH, dehydration etc. Continue metoprolol  Essential hypertension-BP today 134/84 Continue amlodipine,  metoprolol, valsartan Heart healthy low-sodium diet-salty 6 given Increase physical activity as tolerated  Hyperlipidemia-on pravastatin Follows with PCP  Aortic atherosclerosis-continue aspirin and statin therapy. Heart healthy low-sodium high-fiber diet Increase physical activity as tolerated   Disposition: Follow-up with Dr. [name] or me as scheduled.   Jossie Ng. Elysha Daw NP-C     03/21/2022, 2:53 PM  Medical Group  [place] Office [phone] Fax [phone]  Notice: This dictation was prepared with Dragon dictation along with smaller phrase technology. Any transcriptional errors that result from this process are unintentional and may not be corrected upon review.  I spent 14 minutes examining this patient, reviewing medications, and using patient centered shared decision making involving her cardiac care.  Prior to her visit I spent greater than 20 minutes reviewing her past medical history,  medications, and prior cardiac tests.

## 2022-03-21 ENCOUNTER — Ambulatory Visit: Payer: Medicare HMO | Attending: General Practice | Admitting: General Practice

## 2022-03-21 ENCOUNTER — Encounter: Payer: Self-pay | Admitting: General Practice

## 2022-03-21 VITALS — BP 134/84 | HR 57 | Ht 67.0 in | Wt 174.8 lb

## 2022-03-21 DIAGNOSIS — R002 Palpitations: Secondary | ICD-10-CM | POA: Diagnosis not present

## 2022-03-21 DIAGNOSIS — I700 Atherosclerosis of aorta: Secondary | ICD-10-CM | POA: Diagnosis not present

## 2022-03-21 DIAGNOSIS — E785 Hyperlipidemia, unspecified: Secondary | ICD-10-CM

## 2022-03-21 DIAGNOSIS — Z9889 Other specified postprocedural states: Secondary | ICD-10-CM | POA: Diagnosis not present

## 2022-03-21 DIAGNOSIS — I10 Essential (primary) hypertension: Secondary | ICD-10-CM

## 2022-03-21 NOTE — Patient Instructions (Signed)
Medication Instructions:  The current medical regimen is effective;  continue present plan and medications as directed. Please refer to the Current Medication list given to you today.  *If you need a refill on your cardiac medications before your next appointment, please call your pharmacy*   Lab Work: NONE  If you have any lab test that is abnormal or we need to change your treatment, we will call you to review the results.  Testing/Procedures: NONE  Other Instructions MAINTAIN PHYSICAL ACTIVITY-AS TOLERATED  Follow-Up: At [facility], you and your health needs are our priority.  As part of our continuing mission to provide you with exceptional heart care, we have created designated Provider Care Teams.  These Care Teams include your primary Cardiologist (physician) and Advanced Practice Providers (APPs -  Physician Assistants and Nurse Practitioners) who all work together to provide you with the care you need, when you need it.  Your next appointment:   KEEP APPOINTMENT IN [place]   The format for your next appointment:   In Person  Provider:   [name], MD     Important Information About Sugar

## 2022-03-25 ENCOUNTER — Telehealth: Payer: Self-pay | Admitting: Cardiology

## 2022-03-25 ENCOUNTER — Encounter: Payer: Self-pay | Admitting: General Practice

## 2022-03-25 ENCOUNTER — Ambulatory Visit: Payer: Medicare HMO | Attending: General Practice | Admitting: General Practice

## 2022-03-25 VITALS — BP 122/72 | HR 61 | Ht 67.5 in | Wt 172.3 lb

## 2022-03-25 DIAGNOSIS — I10 Essential (primary) hypertension: Secondary | ICD-10-CM

## 2022-03-25 DIAGNOSIS — Z9889 Other specified postprocedural states: Secondary | ICD-10-CM | POA: Diagnosis not present

## 2022-03-25 DIAGNOSIS — R002 Palpitations: Secondary | ICD-10-CM

## 2022-03-25 NOTE — Telephone Encounter (Signed)
Spoke with patient of Dr. [name]. He reports rapid and irregular heart beat, BP "through the roof", jittery/shaking hands, feeling uncomfortable. He was seen on 10/9.  Vitals this morning: BP 156/99 and HR 118 BP 134/81 and HR 108  Normal HR is in the 60s  He had remote AFib post-op for valve repair He had remote AFib about 1 hour when he developed polymyalgia rheumatica (PMR) after taking COVID vaccine in the past -- he just had a COVID vaccine on 10/11 this week. He had a remote h/o ectopy which he took dilt for in the past   He was able to walk for an hour in the park with no issues yesterday but is unable to do normal activities today   Scheduled him for a visit today with Denyse Amass NP at 10:05am

## 2022-03-25 NOTE — Patient Instructions (Signed)
Medication Instructions:  MAY TAKE AN EXTRA 1/2 TAB METOPROLOL SUCCINATE AS NEEDED FOR YOUR PALPITATIONS. *If you need a refill on your cardiac medications before your next appointment, please call your pharmacy*  Lab Work: CBC, BMET, [place] If you have labs (blood work) drawn today and your tests are completely normal, you will receive your results only by:  [place] (if you have MyChart) OR  A paper copy in the mail  If you have any lab test that is abnormal or we need to change your treatment, we will call you to review the results.  Testing/Procedures: MONITOR PALPITATIONS AND CALL BACK IF NEEDED  Other Instructions MAINTAIN HYDRATION  Follow-Up: At [facility], you and your health needs are our priority.  As part of our continuing mission to provide you with exceptional heart care, we have created designated Provider Care Teams.  These Care Teams include your primary Cardiologist (physician) and Advanced Practice Providers (APPs -  Physician Assistants and Nurse Practitioners) who all work together to provide you with the care you need, when you need it.  Your next appointment:   KEEP SCHEDULED FEB APPOINTMENT   The format for your next appointment:   In Person  Provider:   [name], MD     Important Information About Sugar

## 2022-03-25 NOTE — Telephone Encounter (Signed)
Patient c/o Palpitations:  High priority if patient c/o lightheadedness, shortness of breath, or chest pain  How long have you had palpitations/irregular HR/ Afib? Are you having the symptoms now? Yesterday night, is having symptoms now  Are you currently experiencing lightheadedness, SOB or CP? No   Do you have a history of afib (atrial fibrillation) or irregular heart rhythm? Yes  Have you checked your BP or HR? (document readings if available): 156/99 HR 118  Are you experiencing any other symptoms? Feels jittery and chest "feels funny"

## 2022-03-25 NOTE — Progress Notes (Signed)
Cardiology Clinic Note   Patient Name: [name] Date of Encounter: 03/25/2022  Primary Care Provider:  Donnajean Lopes, MD Primary Cardiologist:  [name], MD  Patient Profile    [name] 71 year old male presents the clinic today for follow-up evaluation of his mitral valve repair, palpitations, and essential hypertension.  Past Medical History    Past Medical History:  Diagnosis Date   Arthritis    Atrial fibrillation (HCC)    CHF (congestive heart failure) (HCC) CHRONIC SYSTOLIC WITH ACUTE EXACERBATIONS   GERD (gastroesophageal reflux disease)    Heart murmur    Hyperlipemia    Hypertension    MVP (mitral valve prolapse) WITH SEVERE MITRAL REGURG   Nephrolithiasis    Palpitations WITH PREMATURE VENTRICULAR COTRACTIONS AND PREMATURE ATRIAL CONTRACTIONS   Rheumatic fever REPORTED HISTORY   Sensorineural hearing loss    Varicose veins    Weakness of right upper extremity RELATED TO RUPTURED BICEPS TENDON   Past Surgical History:  Procedure Laterality Date   HEMORRHOID SURGERY     HERNIA REPAIR     HYDROCELE EXCISION / REPAIR Left    INGUINAL HERNIA REPAIR Right    Dr. Deon Pilling   MITRAL VALVE REPAIR  08/19/2010   Dr. Ricard Dillon.  complex valvuloplasty with 71m ring annuloplasty via right mini thoracotomy (Dr. ORoxy Manns   Multiple surgical procedures in his use for     ROTATOR CUFF REPAIR     The patient has also had     TONSILLECTOMY     URETER ECTOPIC RESECTION Left     Allergies  Allergies  Allergen Reactions   Atenolol Other (See Comments)    Stated doctor told him he was allergic. Stated doctor told him he was allergic. Unsure of reaction   Nadolol Other (See Comments)    Stated doctor told him he was allergic    Statins Other (See Comments)    Increase liver enzymes   Fentanyl And Related Hives, Itching, Nausea And Vomiting, Anxiety and Palpitations   Hydromorphone Anxiety, Hives, Itching and Palpitations    History of Present  Illness    GKYON BENTLERis a PMH of mitral valve prolapse status post mitral valve repair, paroxysmal A-fib postoperatively, HTN, and HLD.  He was previously followed by Dr. NKatharina Caper  He underwent mitral valve repair in 2012.  He had a cardiac catheterization postoperatively 3/12 which showed no coronary disease.  His coronary CTA 11/20 showed aortic atherosclerosis but no aneurysm.  His echocardiogram 9/22 showed normal LV function, basal septal hypertrophy, mild left atrial enlargement, prosthetic annuloplasty ring with no mitral regurgitation, mild-moderate mitral stenosis with a mean gradient of 6 mmHg, mild-moderate tricuspid regurgitation.  He was seen in follow-up by Dr. CStanford Breedon 06/30/2021.  During that time he denied chest pain, palpitations, syncope and dyspnea.  He presented to the clinic 03/21/22 for follow-up evaluation and stated he felt well .  He noted that he was having more palpitations that he believed were PVCs after visiting his primary care doctor.  His primary concern at that time was decreased blood pressure.  His amlodipine was decreased to 5 mg daily.  His TSH was also decreased.  He increased his amlodipine back to 10 mg daily and had not had any further episodes of palpitations.  He indicated that the episodes were also more prominent in the summer when he was working outside in the clinic.  We reviewed the importance of staying well-hydrated and avoiding extreme temperatures.  He expressed understanding.  I continued his current medication regimen, had him keep his follow-up appointment in February and maintain his physical activity and diet.  We contacted nurse triage line on 03/25/2022.  He reported increased rapid irregular heartbeats.  His blood pressures were 156/99, 134/81, with heart rate 70 118-108 range.  He presents to the clinic today for follow-up evaluation states he had his COVID booster 2 days ago.  He woke up this morning and noticed increased palpitations.   He reports that after his second COVID dose and after his previous COVID history he had similar symptoms.  He tried Valsalva maneuvers without symptom relief.  He has been maintaining p.o. hydration.  We reviewed his hypothyroidism and sensitivities to medication.  I will order a BMP, CBC, and magnesium today.  He may take an extra half dose of metoprolol for episodes of sustained palpitations.  I will have him continue to avoid triggers for palpitations.  We will have him follow-up as scheduled with Dr. [name] in February.  Today he denies chest pain, shortness of breath, lower extremity edema, fatigue, palpitations, melena, hematuria, hemoptysis, diaphoresis, weakness, presyncope, syncope, orthopnea, and PND.      Home Medications    Prior to Admission medications   Medication Sig Start Date End Date Taking? Authorizing Provider  amLODipine (NORVASC) 10 MG tablet Take 1 tablet (10 mg total) by mouth daily. 07/02/21   Lelon Perla, MD  amoxicillin (AMOXIL) 500 MG capsule Take 2,000 mg by mouth See admin instructions. Only takes when has dental work done 12/27/14   [provider]  aspirin 81 MG tablet Take 81 mg by mouth daily.      [provider]  Coenzyme Q10 (CO Q 10) 100 MG CAPS Take 1 tablet by mouth every morning.     [provider]  COVID-19 mRNA Vac-TriS, Pfizer, SUSP injection Inject into the muscle. 11/04/20   Carlyle Basques, MD  fluticasone (FLONASE) 50 MCG/ACT nasal spray Place 2 sprays into both nostrils as needed. 01/12/21   [provider]  levothyroxine (SYNTHROID) 100 MCG tablet Take 100 mcg by mouth daily before breakfast.    [provider]  LUTEIN-ZEAXANTHIN PO Take 20 mg by mouth daily.     [provider]  metoprolol (TOPROL-XL) 100 MG 24 hr tablet Take 100 mg by mouth daily.      [provider]  pravastatin (PRAVACHOL) 40 MG tablet Take by mouth.    [provider]  Simethicone 125 MG TABS Take  1 tablet by mouth daily as needed (gas).     [provider]  triamcinolone cream (KENALOG) 0.1 % Apply 1 application topically as needed. 11/06/18   [provider]  valsartan (DIOVAN) 160 MG tablet TAKE 1 TABLET BY MOUTH EVERY DAY 01/13/22   Lelon Perla, MD    Family History    Family History  Problem Relation Age of Onset   Heart attack Mother    Angina Mother    Hypertension Mother    Dementia Mother    Seizures Father    Hypertension Sister    Hypertension Brother    Colon cancer Neg Hx    He indicated that his mother is deceased. He indicated that his father is deceased. He indicated that his sister is alive. He indicated that his brother is alive. He indicated that the status of his neg hx is unknown.  Social History    Social History   Socioeconomic History  Marital status: Married    Spouse name: Not on file   Number of children: 0   Years of education: Not on file   Highest education level: Not on file  Occupational History   Not on file  Tobacco Use   Smoking status: Former    Types: Cigarettes    Quit date: 08/12/1994    Years since quitting: 27.6   Smokeless tobacco: Never  Vaping Use   Vaping Use: Never used  Substance and Sexual Activity   Alcohol use: No   Drug use: No   Sexual activity: Not on file  Other Topics Concern   Not on file  Social History Narrative   Not on file   Social Determinants of Health   Financial Resource Strain: Not on file  Food Insecurity: Not on file  Transportation Needs: Not on file  Physical Activity: Not on file  Stress: Not on file  Social Connections: Not on file  Intimate Partner Violence: Not on file     Review of Systems    General:  No chills, fever, night sweats or weight changes.  Cardiovascular:  No chest pain, dyspnea on exertion, edema, orthopnea, palpitations, paroxysmal nocturnal dyspnea. Dermatological: No rash, lesions/masses Respiratory: No cough, dyspnea Urologic: No  hematuria, dysuria Abdominal:   No nausea, vomiting, diarrhea, bright red blood per rectum, melena, or hematemesis Neurologic:  No visual changes, wkns, changes in mental status. All other systems reviewed and are otherwise negative except as noted above.  Physical Exam    VS:  BP 122/72   Pulse 61   Ht 5' 7.5" (1.715 m)   Wt 172 lb 4.8 oz (78.2 kg)   SpO2 97%   BMI 26.59 kg/m  , BMI Body mass index is 26.59 kg/m. GEN: Well nourished, well developed, in no acute distress. HEENT: normal. Neck: Supple, no JVD, carotid bruits, or masses. Cardiac: RRR, no murmurs, rubs, or gallops. No clubbing, cyanosis, edema.  Radials/DP/PT 2+ and equal bilaterally.  Respiratory:  Respirations regular and unlabored, clear to auscultation bilaterally. GI: Soft, nontender, nondistended, BS + x 4. MS: no deformity or atrophy. Skin: warm and dry, no rash. Neuro:  Strength and sensation are intact. Psych: Normal affect.  Accessory Clinical Findings    Recent Labs: No results found for requested labs within last 365 days.   Recent Lipid Panel No results found for: "CHOL", "TRIG", "HDL", "CHOLHDL", "VLDL", "LDLCALC", "LDLDIRECT"       ECG personally reviewed by me today-normal sinus rhythm 61 bpm no ectopy.  EKG 03/21/22 sinus bradycardia early repolarization 57 bpm- No acute changes  Echocardiogram 03/12/2021 IMPRESSIONS     1. Left ventricular ejection fraction, by estimation, is 60 to 65%. The  left ventricle has normal function. The left ventricle has no regional  wall motion abnormalities. There is moderate asymmetric left ventricular  hypertrophy of the basal-septal  segment. Left ventricular diastolic parameters are indeterminate. The  average left ventricular global longitudinal strain is -23.3 %.   2. Right ventricular systolic function is normal. The right ventricular  size is normal. There is mildly elevated pulmonary artery systolic  pressure.   3. Left atrial size was mildly  dilated.   4. There is a prosthetic annuloplasty ring present in the mitral  position.      No evidence of mitral valve regurgitation. Mild to moderate mitral  stenosis. Moderate MS by gradients (MG 6 mmHg at HR 61bpm) but MVA 2.2  cm^2 by continuity equation suggesting mild MS  5. Tricuspid valve regurgitation is mild to moderate.   6. The aortic valve is tricuspid. Aortic valve regurgitation is not  visualized. No aortic stenosis is present.   Assessment & Plan   1. Palpitations-notes increased episodes and more sustained periods of palpitations.Reviewed recent TSH adjustment and educated.  Had COVID booster 2 days ago.  Had similar response with second dose of vaccine and previous booster. Avoid triggers caffeine, chocolate, EtOH, dehydration etc. Continue metoprolol 100 mg daily-May take an extra half of metoprolol as needed for sustained palpitations. Defer  cardiac event monitor at this time.  Would recommend if sustained or worsening symptoms. Order CBC, BMP, magnesium   Mitral valve prolapse-status post mitral valve repair 2012.  No increased DOE or activity intolerance.  Echocardiogram 9/22 showed mild-moderate mitral stenosis with a mean gradient of 6 mmHg. Continue to monitor Continue SBE prophylaxis   Essential hypertension-BP today 122/72 Continue amlodipine, metoprolol, valsartan Heart healthy low-sodium diet-salty 6 given Increase physical activity as tolerated     Disposition: Follow-up with Dr. [name] or me as scheduled.   Jossie Ng. Constantino Starace NP-C     03/25/2022, 10:41 AM [address] Office [phone] Fax [phone]  Notice: This dictation was prepared with Dragon dictation along with smaller phrase technology. Any transcriptional errors that result from this process are unintentional and may not be corrected upon review.  I spent 13 minutes examining this patient, reviewing medications, and using  patient centered shared decision making involving her cardiac care.  Prior to her visit I spent greater than 20 minutes reviewing her past medical history,  medications, and prior cardiac tests.

## 2022-03-26 LAB — CBC
Hematocrit: 45.4 % (ref 37.5–51.0)
Hemoglobin: 15.2 g/dL (ref 13.0–17.7)
MCH: 30 pg (ref 26.6–33.0)
MCHC: 33.5 g/dL (ref 31.5–35.7)
MCV: 90 fL (ref 79–97)
Platelets: 239 10*3/uL (ref 150–450)
RBC: 5.07 x10E6/uL (ref 4.14–5.80)
RDW: 13.3 % (ref 11.6–15.4)
WBC: 5.9 10*3/uL (ref 3.4–10.8)

## 2022-03-26 LAB — BASIC METABOLIC PANEL
BUN/Creatinine Ratio: 15 (ref 10–24)
BUN: 20 mg/dL (ref 8–27)
CO2: 26 mmol/L (ref 20–29)
Calcium: 10 mg/dL (ref 8.6–10.2)
Chloride: 100 mmol/L (ref 96–106)
Creatinine, Ser: 1.35 mg/dL — ABNORMAL HIGH (ref 0.76–1.27)
Glucose: 104 mg/dL — ABNORMAL HIGH (ref 70–99)
Potassium: 4.9 mmol/L (ref 3.5–5.2)
Sodium: 141 mmol/L (ref 134–144)
eGFR: 56 mL/min/{1.73_m2} — ABNORMAL LOW (ref >59)

## 2022-03-26 LAB — MAGNESIUM: Magnesium: 2.4 mg/dL — ABNORMAL HIGH (ref 1.6–2.3)

## 2022-03-28 ENCOUNTER — Encounter (INDEPENDENT_AMBULATORY_CARE_PROVIDER_SITE_OTHER): Payer: Medicare HMO

## 2022-03-28 DIAGNOSIS — R002 Palpitations: Secondary | ICD-10-CM | POA: Diagnosis not present

## 2022-03-29 NOTE — Telephone Encounter (Addendum)
Please contact Mr. Kostelnik and let him know that I have reviewed his questions and remarks.  I am glad that his heart rate is stabilized.  I would agree that his slight elevation in glucose is related to his diet.  However, this is a slight elevation and I would recommend eating a heart healthy diet and avoiding refined/processed type foods.  With regard to his creatinine slightly increasing his p.o. hydration should allow his slight increase to come back down.  Please have him slightly reduce the museum rich foods in his diet.  I do not feel that these results are impacted by the COVID vaccine booster.  Thank you.  Jossie Ng. Cynara Tatham NP-C     07/26/2022, 11:50 AM [place] [address] Office [phone] Fax [phone]   Prior to this documentation I spent greater than 10 minutes reviewing the patient's past medical history and cardiac medications.

## 2022-04-01 DIAGNOSIS — Z79899 Other long term (current) drug therapy: Secondary | ICD-10-CM

## 2022-04-01 MED ORDER — AMLODIPINE BESYLATE 5 MG PO TABS: 5.0000 mg | ORAL_TABLET | Freq: Every day | ORAL | 3 refills | Status: DC

## 2022-04-01 NOTE — Telephone Encounter (Signed)
Please contact [name] and let him know that his concerns have been reviewed.  He may decrease his amlodipine to 5 mg and may take an extra half metoprolol as needed for sustained palpitations.  Please ask him to avoid triggers for palpitations and maintain adequate p.o. hydration.  Thank you for your help.

## 2022-04-01 NOTE — Addendum Note (Signed)
Addended by: Meryl Crutch on: 04/01/2022 10:04 AM   Modules accepted: Orders

## 2022-04-05 DIAGNOSIS — J0110 Acute frontal sinusitis, unspecified: Secondary | ICD-10-CM | POA: Diagnosis not present

## 2022-04-05 DIAGNOSIS — I480 Paroxysmal atrial fibrillation: Secondary | ICD-10-CM | POA: Diagnosis not present

## 2022-04-25 ENCOUNTER — Emergency Department (HOSPITAL_BASED_OUTPATIENT_CLINIC_OR_DEPARTMENT_OTHER): Payer: Medicare HMO | Admitting: Radiology

## 2022-04-25 ENCOUNTER — Encounter (HOSPITAL_BASED_OUTPATIENT_CLINIC_OR_DEPARTMENT_OTHER): Payer: Self-pay | Admitting: Emergency Medicine

## 2022-04-25 ENCOUNTER — Emergency Department (HOSPITAL_BASED_OUTPATIENT_CLINIC_OR_DEPARTMENT_OTHER)
Admission: EM | Admit: 2022-04-25 | Discharge: 2022-04-25 | Disposition: A | Discharge status: Home / Self Care | Payer: Medicare HMO | Attending: Emergency Medicine | Admitting: Emergency Medicine

## 2022-04-25 ENCOUNTER — Telehealth: Payer: Self-pay | Admitting: Cardiology

## 2022-04-25 ENCOUNTER — Other Ambulatory Visit: Payer: Self-pay

## 2022-04-25 DIAGNOSIS — R002 Palpitations: Secondary | ICD-10-CM | POA: Insufficient documentation

## 2022-04-25 DIAGNOSIS — Z7982 Long term (current) use of aspirin: Secondary | ICD-10-CM | POA: Insufficient documentation

## 2022-04-25 DIAGNOSIS — R079 Chest pain, unspecified: Secondary | ICD-10-CM | POA: Diagnosis not present

## 2022-04-25 DIAGNOSIS — Z79899 Other long term (current) drug therapy: Secondary | ICD-10-CM | POA: Diagnosis not present

## 2022-04-25 LAB — CBC WITH DIFFERENTIAL/PLATELET
Abs Immature Granulocytes: 0.02 10*3/uL (ref 0.00–0.07)
Basophils Absolute: 0 10*3/uL (ref 0.0–0.1)
Basophils Relative: 1 %
Eosinophils Absolute: 0 10*3/uL (ref 0.0–0.5)
Eosinophils Relative: 0 %
HCT: 43.9 % (ref 39.0–52.0)
Hemoglobin: 14.8 g/dL (ref 13.0–17.0)
Immature Granulocytes: 0 %
Lymphocytes Relative: 13 %
Lymphs Abs: 0.9 10*3/uL (ref 0.7–4.0)
MCH: 30.6 pg (ref 26.0–34.0)
MCHC: 33.7 g/dL (ref 30.0–36.0)
MCV: 90.7 fL (ref 80.0–100.0)
Monocytes Absolute: 0.5 10*3/uL (ref 0.1–1.0)
Monocytes Relative: 7 %
Neutro Abs: 5.4 10*3/uL (ref 1.7–7.7)
Neutrophils Relative %: 79 %
Platelets: 222 10*3/uL (ref 150–400)
RBC: 4.84 MIL/uL (ref 4.22–5.81)
RDW: 13.8 % (ref 11.5–15.5)
WBC: 6.9 10*3/uL (ref 4.0–10.5)
nRBC: 0 % (ref 0.0–0.2)

## 2022-04-25 LAB — COMPREHENSIVE METABOLIC PANEL
ALT: 35 U/L (ref 0–44)
AST: 32 U/L (ref 15–41)
Albumin: 4.6 g/dL (ref 3.5–5.0)
Alkaline Phosphatase: 66 U/L (ref 38–126)
Anion gap: 11 (ref 5–15)
BUN: 20 mg/dL (ref 8–23)
CO2: 27 mmol/L (ref 22–32)
Calcium: 10.3 mg/dL (ref 8.9–10.3)
Chloride: 103 mmol/L (ref 98–111)
Creatinine, Ser: 1.31 mg/dL — ABNORMAL HIGH (ref 0.61–1.24)
GFR, Estimated: 58 mL/min — ABNORMAL LOW (ref >60)
Glucose, Bld: 110 mg/dL — ABNORMAL HIGH (ref 70–99)
Potassium: 3.9 mmol/L (ref 3.5–5.1)
Sodium: 141 mmol/L (ref 135–145)
Total Bilirubin: 1.1 mg/dL (ref 0.3–1.2)
Total Protein: 7.9 g/dL (ref 6.5–8.1)

## 2022-04-25 LAB — TROPONIN I (HIGH SENSITIVITY)
Troponin I (High Sensitivity): 8 ng/L (ref <18)
Troponin I (High Sensitivity): 6 ng/L (ref <18)

## 2022-04-25 LAB — MAGNESIUM: Magnesium: 2.2 mg/dL (ref 1.7–2.4)

## 2022-04-25 NOTE — Discharge Instructions (Addendum)
If you develop palpitations that do not go away, get worse, chest pain, shortness of breath, dizziness or lightheadedness, or any other new/concerning symptoms then return to the ER for evaluation.  Otherwise follow-up with your cardiologist and give them a call tomorrow.

## 2022-04-25 NOTE — ED Triage Notes (Signed)
Pt arrives to ED with c/o palpitations. He reports that has been on-going for months. Over the past day he has been experiencing them more often and they are not affected by his PRN meds. Hx PVC/PAC.

## 2022-04-25 NOTE — ED Provider Notes (Signed)
[facility] DEPT Provider Note   CSN: [id number] Arrival date & time: 04/25/22  0511     History  Chief Complaint  Patient presents with   Palpitations    [name] is a 71 y.o. male.  HPI 71 year old male with a previous history of PVCs as well as also very transient A-fib presents with palpitations.  He has been dealing with increased palpitations over the last few days.  Originally started back in October and he was told to take an extra dose of his 100 mg XL Toprol as needed.  He has been doing this.  He is also been going up and down on his amlodipine dose.  He feels like whenever he increase the amlodipine his palpitations got better but he did notice a little recurrent leg swelling.  He is currently back on the 5 mg dose.  Otherwise he did feel like there was a little bit of heaviness or tightness in his chest, almost an anxiety feeling when he was having the palpitations.  They were particular bad yesterday and even this morning though now they have been gone for several hours.  Currently he feels fine. Called his cardiology office but they couldn't see him so he came here.  Home Medications Prior to Admission medications   Medication Sig Start Date End Date Taking? Authorizing Provider  amLODipine (NORVASC) 5 MG tablet Take 1 tablet (5 mg total) by mouth daily. 04/01/22   Deberah Pelton, NP  amoxicillin (AMOXIL) 500 MG capsule Take 2,000 mg by mouth See admin instructions. Only takes when has dental work done 12/27/14   [provider]  aspirin 81 MG tablet Take 81 mg by mouth daily.      [provider]  Coenzyme Q10 (CO Q 10) 100 MG CAPS Take 1 tablet by mouth every morning.     [provider]  COVID-19 mRNA Vac-TriS, Pfizer, SUSP injection Inject into the muscle. 11/04/20   Carlyle Basques, MD  fluticasone (FLONASE) 50 MCG/ACT nasal spray Place 2 sprays into both nostrils as needed. 01/12/21   [provider]   levothyroxine (SYNTHROID) 112 MCG tablet Take 112 mcg by mouth every morning. 02/22/22   [provider]  LUTEIN-ZEAXANTHIN PO Take 20 mg by mouth daily.     [provider]  metoprolol (TOPROL-XL) 100 MG 24 hr tablet Take 100 mg by mouth daily.  MAY TAKE EXTRA 1/2 FOR SUSTAINED PALPITATIONS    [provider]  pravastatin (PRAVACHOL) 40 MG tablet Take by mouth.    [provider]  Simethicone 125 MG TABS Take 1 tablet by mouth daily as needed (gas).     [provider]  triamcinolone cream (KENALOG) 0.1 % Apply 1 application topically as needed. 11/06/18   [provider]  valsartan (DIOVAN) 160 MG tablet TAKE 1 TABLET BY MOUTH EVERY DAY 01/13/22   Lelon Perla, MD      Allergies    Atenolol, Atorvastatin, Nadolol, Rosuvastatin, Statins, Fentanyl and related, and Hydromorphone    Review of Systems   Review of Systems  Respiratory:  Positive for chest tightness. Negative for shortness of breath.   Cardiovascular:  Positive for palpitations.    Physical Exam Updated Vital Signs BP (!) 148/95   Pulse 63   Temp 98.6 F (37 C) (Oral)   Resp 18   Ht '5\' 7"'$  (1.702 m)   Wt 77.1 kg   SpO2 99%   BMI 26.63 kg/m  Physical Exam  Vitals and nursing note reviewed.  Constitutional:      General: He is not in acute distress.    Appearance: He is well-developed. He is not ill-appearing or diaphoretic.  HENT:     Head: Normocephalic and atraumatic.  Cardiovascular:     Rate and Rhythm: Normal rate and regular rhythm.     Heart sounds: Normal heart sounds.  Pulmonary:     Effort: Pulmonary effort is normal.     Breath sounds: Normal breath sounds.  Abdominal:     General: There is no distension.     Palpations: Abdomen is soft.     Tenderness: There is no abdominal tenderness.  Musculoskeletal:     Right lower leg: No edema.     Left lower leg: No edema.  Skin:    General: Skin is warm and dry.  Neurological:     Mental Status:  He is alert.     ED Results / Procedures / Treatments   Labs (all labs ordered are listed, but only abnormal results are displayed) Labs Reviewed  COMPREHENSIVE METABOLIC PANEL - Abnormal; Notable for the following components:      Result Value   Glucose, Bld 110 (*)    Creatinine, Ser 1.31 (*)    GFR, Estimated 58 (*)    All other components within normal limits  CBC WITH DIFFERENTIAL/PLATELET  MAGNESIUM  TROPONIN I (HIGH SENSITIVITY)  TROPONIN I (HIGH SENSITIVITY)    EKG EKG Interpretation  Date/Time:  Monday April 25 2022 10:12:25 EST Ventricular Rate:  60 PR Interval:  144 QRS Duration: 86 QT Interval:  416 QTC Calculation: 416 R Axis:   27 Text Interpretation: Normal sinus rhythm no acute ST/T changes no significant change since 2021 Confirmed by Sherwood Gambler [phone]) on 04/25/2022 2:59:10 PM  Radiology DG Chest 1 View  Result Date: 04/25/2022 CLINICAL DATA:  Chest pain EXAM: CHEST  1 VIEW COMPARISON:  09/29/2019 FINDINGS: The heart size and mediastinal contours are within normal limits. Unchanged eventration of anterior right hemidiaphragm. Both lungs are clear. The visualized skeletal structures are unremarkable. IMPRESSION: No active disease. Electronically Signed   By: Davina Poke D.O.   On: 04/25/2022 11:51    Procedures Procedures    Medications Ordered in ED Medications - No data to display  ED Course/ Medical Decision Making/ A&P                           Medical Decision Making Amount and/or Complexity of Data Reviewed External Data Reviewed: notes. Labs: ordered.    Details: Normal troponins, magnesium and potassium. Radiology: independent interpretation performed.    Details: No CHF ECG/medicine tests: independent interpretation performed.    Details: No arrhythmia or ischemia   Patient presents with symptomatic palpitations.  They have been resolved for several hours since earlier this morning.  Otherwise he has a known history of  PVCs.  He states 1 time he had very brief A-fib after getting the COVID-vaccine but he states that that is not what these palpitations felt like.  Electrolytes are overall unremarkable.  We discussed options which includes we could watch him on the monitor here for a bit and see if these palpitations recur versus having him follow-up with cardiology and getting an event monitor.  He would prefer to do that rather than staying any longer.  He otherwise did have some vague chest discomfort last night but has 2 negative troponins and I think ACS is  pretty unlikely and otherwise symptomatic palpitations.  No syncope/near syncope symptoms.  Will discharge home with return precautions and cards follow-up.        Final Clinical Impression(s) / ED Diagnoses Final diagnoses:  Heart palpitations    Rx / DC Orders ED Discharge Orders          Ordered    Ambulatory referral to Cardiology       Comments: If you have not heard from the Cardiology office within the next 72 hours please call [phone].   04/25/22 [place], MD 04/25/22 1553

## 2022-04-25 NOTE — ED Notes (Signed)
Patient verbalizes understanding of discharge instructions. Opportunity for questioning and answers were provided. Patient discharged from ED.  °

## 2022-04-25 NOTE — Telephone Encounter (Signed)
Patient stated he has been having irregular heart rate since yesterday. He gets a tight feeling across his back and has a hard time breathing. BP yesterday 120/72, P 49. Taking meds as prescribed. The prn dose of met succ is not helping his irregular beats. He stated he did not want to go to the ED hand have to wait  J. Cleaver advised and ordered for patient to go to the [facility] ED. Patient advised. He stated his wife will drive him there.

## 2022-04-25 NOTE — Telephone Encounter (Signed)
Patient c/o Palpitations:  High priority if patient c/o lightheadedness, shortness of breath, or chest pain  How long have you had palpitations/irregular HR/ Afib? Are you having the symptoms now? Few days  Are you currently experiencing lightheadedness, SOB or CP? No  Do you have a history of afib (atrial fibrillation) or irregular heart rhythm? Yes  Have you checked your BP or HR? (document readings if available): No  Are you experiencing any other symptoms? Back pain, tired no energy and urinating a lot more

## 2022-04-26 ENCOUNTER — Encounter: Payer: Self-pay | Admitting: Nurse Practitioner

## 2022-04-26 ENCOUNTER — Ambulatory Visit (INDEPENDENT_AMBULATORY_CARE_PROVIDER_SITE_OTHER): Payer: Medicare HMO

## 2022-04-26 ENCOUNTER — Ambulatory Visit: Payer: Medicare HMO | Attending: Nurse Practitioner | Admitting: Nurse Practitioner

## 2022-04-26 VITALS — BP 112/60 | HR 64 | Ht 67.0 in | Wt 170.0 lb

## 2022-04-26 DIAGNOSIS — R002 Palpitations: Secondary | ICD-10-CM | POA: Diagnosis not present

## 2022-04-26 DIAGNOSIS — I341 Nonrheumatic mitral (valve) prolapse: Secondary | ICD-10-CM | POA: Diagnosis not present

## 2022-04-26 DIAGNOSIS — R7989 Other specified abnormal findings of blood chemistry: Secondary | ICD-10-CM

## 2022-04-26 DIAGNOSIS — Z9889 Other specified postprocedural states: Secondary | ICD-10-CM

## 2022-04-26 DIAGNOSIS — I10 Essential (primary) hypertension: Secondary | ICD-10-CM

## 2022-04-26 NOTE — Progress Notes (Unsigned)
ZIO XT serial # [id number] from office inventory applied to patient.  Dr. [name] to read.

## 2022-04-26 NOTE — Progress Notes (Signed)
Cardiology Office Note:    Date:  04/26/2022   ID:  [name], DOB 1950/10/28, MRN [id number]  PCP:  Donnajean Lopes, MD   [facility] [place] [facility] Providers Cardiologist:  [name], MD     Referring MD: Donnajean Lopes, MD   Chief Complaint: palpitations  History of Present Illness:    [name] is a very pleasant 71 y.o. male with a hx of palpitations, severe MR s/p MVR, PAF postoperatively, HTN, HLD, and hypothyroidism.   He underwent mitral valve repair in 2012.  He had cardiac catheterization postoperatively 08/2010 which showed no coronary disease. Coronary CTA 04/2019 showed aortic atherosclerosis but no aneurysm.  Most recent echocardiogram 03/2021 showed normal LVEF 60 to 65%, mild mitral stenosis s/p mitral ring 2012 with MG 6 mmHg at HR 61 bpm but MVA 2.2 cm by continuity equation suggesting mild MS, no mitral regurgitation, mild to moderate TR. He developed polymyalgia rheumatica after 2nd COVID vaccine.   Recent concern for worsening palpitations.  Was seen by Coletta Memos, NP on 03/21/2022 for increased palpitations he believed were PVCs after visiting his primary care physician.  He was also experiencing decreased blood pressure and amlodipine was decreased to 5 mg daily.  He increased his amlodipine to 10 mg daily and palpitations improved. He felt these episodes were more prominent in the summer when he was working outside. He returned for office visit on 03/25/2022 with increased palpitations following COVID booster.  He was advised to take an extra dose of Toprol as needed. Creatinine was slightly elevated at 1.35 on lab work and magnesium was 2.4.  He was advised to increase hydration and return for follow-up in February as previously scheduled.  ED visit 04/25/22 for increased palpitations with normal troponin, electrolytes stable, creatinine 1.31, CBC unremarkable. EKG revealed NSR at 60 bpm, no ST abnormality.   Today, he is here alone for follow-up.  Reports that palpitations have improved since the time he arrived at the ED. I asked about recent check of thyroid function and he reports it has been several months. Had sensitivity to higher dose of levothyroxine after manufacturer was changed earlier this year. Recent unintentional weight loss of 4 lbs which has occurred in the past when thyroid function was abnormal. Palpitations reported as feeling like "PVCs" that he has experienced in the past.  Previous episodes of atrial fibrillation following valve replacement and COVID were different. Palpitations felt "fluttery" and HR was fast. Has self-adjusted amlodipine and Toprol with symptoms of increased palpitations and is currently feeling well. Symptoms have been erratic, more noticeable when lying down. Asks about Roemheld syndrome (gastrocardiac syndrome) which he read about and believes he meets a lot of the criteria.  He denies chest pain, shortness of breath, orthopnea, PND, presyncope, syncope.  Past Medical History:  Diagnosis Date   Arthritis    Atrial fibrillation (HCC)    CHF (congestive heart failure) (HCC) CHRONIC SYSTOLIC WITH ACUTE EXACERBATIONS   GERD (gastroesophageal reflux disease)    Heart murmur    Hyperlipemia    Hypertension    MVP (mitral valve prolapse) WITH SEVERE MITRAL REGURG   Nephrolithiasis    Palpitations WITH PREMATURE VENTRICULAR COTRACTIONS AND PREMATURE ATRIAL CONTRACTIONS   Rheumatic fever REPORTED HISTORY   Sensorineural hearing loss    Varicose veins    Weakness of right upper extremity RELATED TO RUPTURED BICEPS TENDON    Past Surgical History:  Procedure Laterality Date   HEMORRHOID SURGERY     HERNIA  REPAIR     HYDROCELE EXCISION / REPAIR Left    INGUINAL HERNIA REPAIR Right    Dr. Deon Pilling   MITRAL VALVE REPAIR  08/19/2010   Dr. Ricard Dillon.  complex valvuloplasty with 62m ring annuloplasty via right mini thoracotomy (Dr. ORoxy Manns   Multiple surgical procedures in his use for     ROTATOR CUFF REPAIR      The patient has also had     TONSILLECTOMY     URETER ECTOPIC RESECTION Left     Current Medications: Current Meds  Medication Sig   amLODipine (NORVASC) 10 MG tablet Take 10 mg by mouth daily.   amoxicillin (AMOXIL) 500 MG capsule Take 2,000 mg by mouth See admin instructions. Only takes when has dental work done   aspirin 81 MG tablet Take 81 mg by mouth daily.     Coenzyme Q10 (CO Q 10) 100 MG CAPS Take 1 tablet by mouth every morning.    fluticasone (FLONASE) 50 MCG/ACT nasal spray Place 2 sprays into both nostrils as needed.   levothyroxine (SYNTHROID) 112 MCG tablet Take 112 mcg by mouth every morning.   LUTEIN-ZEAXANTHIN PO Take 20 mg by mouth daily.    metoprolol (TOPROL-XL) 100 MG 24 hr tablet Take 100 mg by mouth daily.  MAY TAKE EXTRA 1/2 FOR SUSTAINED PALPITATIONS   pravastatin (PRAVACHOL) 40 MG tablet Take by mouth.   Simethicone 125 MG TABS Take 1 tablet by mouth daily as needed (gas).    triamcinolone cream (KENALOG) 0.1 % Apply 1 application topically as needed.   valsartan (DIOVAN) 160 MG tablet TAKE 1 TABLET BY MOUTH EVERY DAY     Allergies:   Atenolol, Atorvastatin, Nadolol, Rosuvastatin, Statins, Fentanyl and related, and Hydromorphone   Social History   Socioeconomic History   Marital status: Married    Spouse name: Not on file   Number of children: 0   Years of education: Not on file   Highest education level: Not on file  Occupational History   Not on file  Tobacco Use   Smoking status: Former    Types: Cigarettes    Quit date: 08/12/1994    Years since quitting: 27.7   Smokeless tobacco: Never  Vaping Use   Vaping Use: Never used  Substance and Sexual Activity   Alcohol use: No   Drug use: No   Sexual activity: Not on file  Other Topics Concern   Not on file  Social History Narrative   Not on file   Social Determinants of Health   Financial Resource Strain: Not on file  Food Insecurity: Not on file  Transportation Needs: Not on file   Physical Activity: Not on file  Stress: Not on file  Social Connections: Not on file     Family History: The patient's family history includes Angina in his mother; Dementia in his mother; Heart attack in his mother; Hypertension in his brother, mother, and sister; Seizures in his father. There is no history of Colon cancer.  ROS:   Please see the history of present illness.    + palpitations  All other systems reviewed and are negative.  Labs/Other Studies Reviewed:    The following studies were reviewed today:  Echo 03/14/2021  1. Left ventricular ejection fraction, by estimation, is 60 to 65%. The  left ventricle has normal function. The left ventricle has no regional  wall motion abnormalities. There is moderate asymmetric left ventricular  hypertrophy of the basal-septal  segment. Left ventricular diastolic parameters  are indeterminate. The  average left ventricular global longitudinal strain is -23.3 %.   2. Right ventricular systolic function is normal. The right ventricular  size is normal. There is mildly elevated pulmonary artery systolic  pressure.   3. Left atrial size was mildly dilated.   4. There is a prosthetic annuloplasty ring present in the mitral  position.     No evidence of mitral valve regurgitation. Mild to moderate mitral  stenosis. Moderate MS by gradients (MG 6 mmHg at HR 61bpm) but MVA 2.2  cm^2 by continuity equation suggesting mild MS   5. Tricuspid valve regurgitation is mild to moderate.   6. The aortic valve is tricuspid. Aortic valve regurgitation is not  visualized. No aortic stenosis is present.    Recent Labs: 04/25/2022: ALT 35; BUN 20; Creatinine, Ser 1.31; Hemoglobin 14.8; Magnesium 2.2; Platelets 222; Potassium 3.9; Sodium 141  Recent Lipid Panel No results found for: "CHOL", "TRIG", "HDL", "CHOLHDL", "VLDL", "LDLCALC", "LDLDIRECT"   Risk Assessment/Calculations:       Physical Exam:    VS:  BP 112/60   Pulse 64   Ht '5\' 7"'$   (1.702 m)   Wt 170 lb (77.1 kg)   SpO2 99%   BMI 26.63 kg/m     Wt Readings from Last 3 Encounters:  04/26/22 170 lb (77.1 kg)  04/25/22 170 lb (77.1 kg)  03/25/22 172 lb 4.8 oz (78.2 kg)     GEN:  Well nourished, well developed in no acute distress HEENT: Normal NECK: No JVD; No carotid bruits CARDIAC: RRR, no murmurs, rubs, gallops RESPIRATORY:  Clear to auscultation without rales, wheezing or rhonchi  ABDOMEN: Soft, non-tender, non-distended MUSCULOSKELETAL:  No edema; No deformity. 2+ pedal pulses, equal bilaterally SKIN: Warm and dry NEUROLOGIC:  Alert and oriented x 3 PSYCHIATRIC:  Normal affect   EKG:  EKG is not ordered today.    Diagnoses:    1. Palpitations   2. S/P MVR (mitral valve repair)   3. Essential hypertension   4. Blood creatinine increased compared with prior measurement   5. MVP (mitral valve prolapse)    Assessment and Plan:     Palpitations: Increase palpitations over the last month, reports they have actually improved over the last 2 days since going to ED. Symptoms have occurred after COVID shots on 2 occasions.  Avoids triggers - caffeine, alcohol. Feels better when taking metoprolol 100 mg daily and amlodipine 10 mg daily. Magnesium, CBC, and electrolytes stable on 11/13 labs. We will recheck TSH today. We will place 14 day Zio monitor for evaluation of increased episodes of palpitations.   Hypertension: BP is well controlled. He feels that palpitations are better on higher dose of amlodipine, 10 mg. No medication changes today.  Mitral valve prolapse: s/p MVR 2012.  Annuloplasty ring present in mitral position with no evidence of mitral valve regurg, mild to moderate mitral stenosis on echo 03/2021. No evidence of worsening valve function today.   Elevated creatinine: Creatinine 1.31 on 11/13. History of decreased kidney function in one kidney as well as aneurysm of urethra as a teenager.  Creatinine was in normal range until recently. Admits to  not staying well-hydrated.  Encouraged him to increase water intake.  Disposition: 3 months with Dr. [name]  Medication Adjustments/Labs and Tests Ordered: Current medicines are reviewed at length with the patient today.  Concerns regarding medicines are outlined above.  Orders Placed This Encounter  Procedures   TSH   LONG TERM MONITOR (3-14 DAYS)  No orders of the defined types were placed in this encounter.   Patient Instructions  Medication Instructions:   Your physician recommends that you continue on your current medications as directed. Please refer to the Current Medication list given to you today.   *If you need a refill on your cardiac medications before your next appointment, please call your pharmacy*   Lab Work:   TODAY!!!!! TSH  If you have labs (blood work) drawn today and your tests are completely normal, you will receive your results only by: [place] (if you have MyChart) OR A paper copy in the mail If you have any lab test that is abnormal or we need to change your treatment, we will call you to review the results.   Testing/Procedures:  [facility] Gulling- Long Term Monitor Instructions  Your physician has requested you wear a ZIO patch monitor for 14 days.  This is a single patch monitor. [employer] supplies one patch monitor per enrollment. Additional stickers are not available. Please do not apply patch if you will be having a Nuclear Stress Test,  Echocardiogram, Cardiac CT, MRI, or Chest Xray during the period you would be wearing the  monitor. The patch cannot be worn during these tests. You cannot remove and re-apply the  ZIO XT patch monitor.  Your ZIO patch monitor will be mailed 3 day USPS to your address on file. It may take 3-5 days  to receive your monitor after you have been enrolled.  Once you have received your monitor, please review the enclosed instructions. Your monitor  has already been registered assigning a specific monitor serial #  to you.  Billing and Patient Assistance Program Information  We have supplied [employer] with any of your insurance information on file for billing purposes. [employer] offers a sliding scale Patient Assistance Program for patients that do not have  insurance, or whose insurance does not completely cover the cost of the ZIO monitor.  You must apply for the Patient Assistance Program to qualify for this discounted rate.  To apply, please call [employer] at [phone], select option 4, select option 2, ask to apply for  Patient Assistance Program. Theodore Demark will ask your household income, and how many people  are in your household. They will quote your out-of-pocket cost based on that information.  [employer] will also be able to set up a 46-month interest-free payment plan if needed.  Applying the monitor   Shave hair from upper left chest.  Hold abrader disc by orange tab. Rub abrader in 40 strokes over the upper left chest as  indicated in your monitor instructions.  Clean area with 4 enclosed alcohol pads. Let dry.  Apply patch as indicated in monitor instructions. Patch will be placed under collarbone on left  side of chest with arrow pointing upward.  Rub patch adhesive wings for 2 minutes. Remove white label marked "1". Remove the white  label marked "2". Rub patch adhesive wings for 2 additional minutes.  While looking in a mirror, press and release button in center of patch. A small green light will  flash 3-4 times. This will be your only indicator that the monitor has been turned on.  Do not shower for the first 24 hours. You may shower after the first 24 hours.  Press the button if you feel a symptom. You will hear a small click. Record Date, Time and  Symptom in the Patient Logbook.  When you are ready to remove the patch, follow instructions on  the last 2 pages of Patient  Logbook. Stick patch monitor onto the last page of Patient Logbook.  Place Patient Logbook in the blue and white  box. Use locking tab on box and tape box closed  securely. The blue and white box has prepaid postage on it. Please place it in the mailbox as  soon as possible. Your physician should have your test results approximately 7 days after the  monitor has been mailed back to [facility].  Call [place] at [phone] if you have questions regarding  your ZIO XT patch monitor. Call them immediately if you see an orange light blinking on your  monitor.  If your monitor falls off in less than 4 days, contact our Monitor department at [phone].  If your monitor becomes loose or falls off after 4 days call [employer] at [phone] for  suggestions on securing your monitor    Follow-Up: At [facility], you and your health needs are our priority.  As part of our continuing mission to provide you with exceptional heart care, we have created designated Provider Care Teams.  These Care Teams include your primary Cardiologist (physician) and Advanced Practice Providers (APPs -  Physician Assistants and Nurse Practitioners) who all work together to provide you with the care you need, when you need it.  We recommend signing up for the patient portal called "MyChart".  Sign up information is provided on this After Visit Summary.  MyChart is used to connect with patients for Virtual Visits (Telemedicine).  Patients are able to view lab/test results, encounter notes, upcoming appointments, etc.  Non-urgent messages can be sent to your provider as well.   To learn more about what you can do with MyChart, go to [email or web address].    Your next appointment:   3 month(s)  The format for your next appointment:   In Person  Provider:   [name], MD     Important Information About Sugar         Signed, Emmaline Life, NP  04/26/2022 5:31 PM    [place]

## 2022-04-26 NOTE — Patient Instructions (Signed)
Medication Instructions:   Your physician recommends that you continue on your current medications as directed. Please refer to the Current Medication list given to you today.   *If you need a refill on your cardiac medications before your next appointment, please call your pharmacy*   Lab Work:   TODAY!!!!! TSH  If you have labs (blood work) drawn today and your tests are completely normal, you will receive your results only by: [place] (if you have MyChart) OR A paper copy in the mail If you have any lab test that is abnormal or we need to change your treatment, we will call you to review the results.   Testing/Procedures:  Bryn Gulling- Long Term Monitor Instructions  Your physician has requested you wear a ZIO patch monitor for 14 days.  This is a single patch monitor. [employer] supplies one patch monitor per enrollment. Additional stickers are not available. Please do not apply patch if you will be having a Nuclear Stress Test,  Echocardiogram, Cardiac CT, MRI, or Chest Xray during the period you would be wearing the  monitor. The patch cannot be worn during these tests. You cannot remove and re-apply the  ZIO XT patch monitor.  Your ZIO patch monitor will be mailed 3 day USPS to your address on file. It may take 3-5 days  to receive your monitor after you have been enrolled.  Once you have received your monitor, please review the enclosed instructions. Your monitor  has already been registered assigning a specific monitor serial # to you.  Billing and Patient Assistance Program Information  We have supplied [employer] with any of your insurance information on file for billing purposes. [employer] offers a sliding scale Patient Assistance Program for patients that do not have  insurance, or whose insurance does not completely cover the cost of the ZIO monitor.  You must apply for the Patient Assistance Program to qualify for this discounted rate.  To apply, please call [employer]  at [phone], select option 4, select option 2, ask to apply for  Patient Assistance Program. Theodore Demark will ask your household income, and how many people  are in your household. They will quote your out-of-pocket cost based on that information.  [employer] will also be able to set up a 20-month interest-free payment plan if needed.  Applying the monitor   Shave hair from upper left chest.  Hold abrader disc by orange tab. Rub abrader in 40 strokes over the upper left chest as  indicated in your monitor instructions.  Clean area with 4 enclosed alcohol pads. Let dry.  Apply patch as indicated in monitor instructions. Patch will be placed under collarbone on left  side of chest with arrow pointing upward.  Rub patch adhesive wings for 2 minutes. Remove white label marked "1". Remove the white  label marked "2". Rub patch adhesive wings for 2 additional minutes.  While looking in a mirror, press and release button in center of patch. A small green light will  flash 3-4 times. This will be your only indicator that the monitor has been turned on.  Do not shower for the first 24 hours. You may shower after the first 24 hours.  Press the button if you feel a symptom. You will hear a small click. Record Date, Time and  Symptom in the Patient Logbook.  When you are ready to remove the patch, follow instructions on the last 2 pages of Patient  Logbook. Stick patch monitor onto the last page of  Patient Logbook.  Place Patient Logbook in the blue and white box. Use locking tab on box and tape box closed  securely. The blue and white box has prepaid postage on it. Please place it in the mailbox as  soon as possible. Your physician should have your test results approximately 7 days after the  monitor has been mailed back to [facility].  Call [place] at [phone] if you have questions regarding  your ZIO XT patch monitor. Call them immediately if you see an orange light  blinking on your  monitor.  If your monitor falls off in less than 4 days, contact our Monitor department at [phone].  If your monitor becomes loose or falls off after 4 days call [employer] at [phone] for  suggestions on securing your monitor    Follow-Up: At [facility] you and your health needs are our priority.  As part of our continuing mission to provide you with exceptional heart care, we have created designated Provider Care Teams.  These Care Teams include your primary Cardiologist (physician) and Advanced Practice Providers (APPs -  Physician Assistants and Nurse Practitioners) who all work together to provide you with the care you need, when you need it.  We recommend signing up for the patient portal called "MyChart".  Sign up information is provided on this After Visit Summary.  MyChart is used to connect with patients for Virtual Visits (Telemedicine).  Patients are able to view lab/test results, encounter notes, upcoming appointments, etc.  Non-urgent messages can be sent to your provider as well.   To learn more about what you can do with MyChart, go to [email or web address].    Your next appointment:   3 month(s)  The format for your next appointment:   In Person  Provider:   [name], MD     Important Information About Sugar

## 2022-04-27 LAB — TSH: TSH: 2.38 u[IU]/mL (ref 0.450–4.500)

## 2022-05-02 ENCOUNTER — Telehealth: Payer: Self-pay | Admitting: *Deleted

## 2022-05-02 NOTE — Telephone Encounter (Signed)
     Patient  visit on 04/25/2022  at [place] ed  was for chest   Have you been able to follow up with your primary care physician? Saw np at cardio and he is wearing a monitor at this time for 2 weeks   The patient was able to obtain any needed medicine or equipment.  Are there diet recommendations that you are having difficulty following?  Patient expresses understanding of discharge instructions and education provided has no other needs at this time.  No others needs voiced   [place] [phone] [address] , [place] [address] Email : [email or web address]

## 2022-05-12 MED ORDER — AMLODIPINE BESYLATE 5 MG PO TABS: 5.0000 mg | ORAL_TABLET | Freq: Two times a day (BID) | ORAL | 3 refills | Status: AC

## 2022-05-13 DIAGNOSIS — L438 Other lichen planus: Secondary | ICD-10-CM | POA: Insufficient documentation

## 2022-05-16 DIAGNOSIS — M79642 Pain in left hand: Secondary | ICD-10-CM | POA: Diagnosis not present

## 2022-05-16 DIAGNOSIS — R7401 Elevation of levels of liver transaminase levels: Secondary | ICD-10-CM | POA: Diagnosis not present

## 2022-05-16 DIAGNOSIS — M79641 Pain in right hand: Secondary | ICD-10-CM | POA: Diagnosis not present

## 2022-05-16 DIAGNOSIS — M87052 Idiopathic aseptic necrosis of left femur: Secondary | ICD-10-CM | POA: Diagnosis not present

## 2022-05-16 DIAGNOSIS — M87051 Idiopathic aseptic necrosis of right femur: Secondary | ICD-10-CM | POA: Diagnosis not present

## 2022-05-16 DIAGNOSIS — E663 Overweight: Secondary | ICD-10-CM | POA: Diagnosis not present

## 2022-05-16 DIAGNOSIS — M353 Polymyalgia rheumatica: Secondary | ICD-10-CM | POA: Diagnosis not present

## 2022-05-16 DIAGNOSIS — Z6827 Body mass index (BMI) 27.0-27.9, adult: Secondary | ICD-10-CM | POA: Diagnosis not present

## 2022-05-16 DIAGNOSIS — R768 Other specified abnormal immunological findings in serum: Secondary | ICD-10-CM | POA: Diagnosis not present

## 2022-05-18 DIAGNOSIS — R002 Palpitations: Secondary | ICD-10-CM | POA: Diagnosis not present

## 2022-06-08 DIAGNOSIS — Z0100 Encounter for examination of eyes and vision without abnormal findings: Secondary | ICD-10-CM | POA: Diagnosis not present

## 2022-06-22 ENCOUNTER — Other Ambulatory Visit (HOSPITAL_COMMUNITY): Payer: Self-pay | Admitting: Family Medicine

## 2022-06-22 DIAGNOSIS — R519 Headache, unspecified: Secondary | ICD-10-CM

## 2022-06-22 DIAGNOSIS — I480 Paroxysmal atrial fibrillation: Secondary | ICD-10-CM | POA: Diagnosis not present

## 2022-06-22 DIAGNOSIS — M353 Polymyalgia rheumatica: Secondary | ICD-10-CM | POA: Diagnosis not present

## 2022-06-23 ENCOUNTER — Other Ambulatory Visit: Payer: Self-pay | Admitting: Family Medicine

## 2022-06-23 DIAGNOSIS — R519 Headache, unspecified: Secondary | ICD-10-CM

## 2022-06-27 ENCOUNTER — Ambulatory Visit (HOSPITAL_COMMUNITY)
Admission: RE | Admit: 2022-06-27 | Discharge: 2022-06-27 | Disposition: A | Discharge status: Home / Self Care | Payer: Medicare HMO | Source: Ambulatory Visit | Attending: Family Medicine | Admitting: Family Medicine

## 2022-06-27 DIAGNOSIS — R519 Headache, unspecified: Secondary | ICD-10-CM | POA: Diagnosis not present

## 2022-07-11 NOTE — Progress Notes (Signed)
HPI: Follow-up history of mitral valve repair secondary to mitral valve prolapse mitral regurgitation, paroxysmal atrial fibrillation (postoperative), hypertension and hyperlipidemia. Previously followed by Dr. Acie Fredrickson. Patient had mitral valve repair in 2012. Note preoperative cardiac catheterization March 2012 showed no coronary disease. CTA November 2020 showed aortic atherosclerosis but no aneurysm. Echocardiogram September 2022 showed normal LV function, basal septal hypertrophy, mild left atrial enlargement, prosthetic annuloplasty ring with no mitral regurgitation and mild to moderate mitral stenosis with mean gradient 6 mmHg, mild to moderate tricuspid regurgitation.  Seen recently in the office with complaints of palpitations.  Monitor December 2023 showed sinus rhythm with occasional PAC, brief PAT and PVCs.  Since last seen patient denies dyspnea, chest pain, palpitations or syncope.  Current Outpatient Medications  Medication Sig Dispense Refill   amLODipine (NORVASC) 5 MG tablet Take 1 tablet (5 mg total) by mouth 2 (two) times daily. 180 tablet 3   amoxicillin (AMOXIL) 500 MG capsule Take 2,000 mg by mouth See admin instructions. Only takes when has dental work done     aspirin 81 MG tablet Take 81 mg by mouth daily.       Coenzyme Q10 (CO Q 10) 100 MG CAPS Take 1 tablet by mouth every morning.      fluticasone (FLONASE) 50 MCG/ACT nasal spray Place 2 sprays into both nostrils as needed.     levothyroxine (SYNTHROID) 112 MCG tablet Take 112 mcg by mouth every morning.     LUTEIN-ZEAXANTHIN PO Take 20 mg by mouth daily.      metoprolol (TOPROL-XL) 100 MG 24 hr tablet Take 100 mg by mouth daily.  MAY TAKE EXTRA 1/2 FOR SUSTAINED PALPITATIONS     pravastatin (PRAVACHOL) 40 MG tablet Take by mouth.     Simethicone 125 MG TABS Take 1 tablet by mouth daily as needed (gas).      triamcinolone cream (KENALOG) 0.1 % Apply 1 application topically as needed.     valsartan (DIOVAN) 160 MG  tablet TAKE 1 TABLET BY MOUTH EVERY DAY 90 tablet 3   No current facility-administered medications for this visit.     Past Medical History:  Diagnosis Date   Arthritis    Atrial fibrillation (HCC)    CHF (congestive heart failure) (HCC) CHRONIC SYSTOLIC WITH ACUTE EXACERBATIONS   GERD (gastroesophageal reflux disease)    Heart murmur    Hyperlipemia    Hypertension    MVP (mitral valve prolapse) WITH SEVERE MITRAL REGURG   Nephrolithiasis    Palpitations WITH PREMATURE VENTRICULAR COTRACTIONS AND PREMATURE ATRIAL CONTRACTIONS   Rheumatic fever REPORTED HISTORY   Sensorineural hearing loss    Varicose veins    Weakness of right upper extremity RELATED TO RUPTURED BICEPS TENDON    Past Surgical History:  Procedure Laterality Date   HEMORRHOID SURGERY     HERNIA REPAIR     HYDROCELE EXCISION / REPAIR Left    INGUINAL HERNIA REPAIR Right    Dr. Deon Pilling   MITRAL VALVE REPAIR  08/19/2010   Dr. Ricard Dillon.  complex valvuloplasty with 85m ring annuloplasty via right mini thoracotomy (Dr. ORoxy Manns   Multiple surgical procedures in his use for     ROTATOR CUFF REPAIR     The patient has also had     TONSILLECTOMY     URETER ECTOPIC RESECTION Left     Social History   Socioeconomic History   Marital status: Married    Spouse name: Not on file   Number of children:  0   Years of education: Not on file   Highest education level: Not on file  Occupational History   Not on file  Tobacco Use   Smoking status: Former    Types: Cigarettes    Quit date: 08/12/1994    Years since quitting: 27.9   Smokeless tobacco: Never  Vaping Use   Vaping Use: Never used  Substance and Sexual Activity   Alcohol use: No   Drug use: No   Sexual activity: Not on file  Other Topics Concern   Not on file  Social History Narrative   Not on file   Social Determinants of Health   Financial Resource Strain: Not on file  Food Insecurity: Not on file  Transportation Needs: Not on file  Physical  Activity: Not on file  Stress: Not on file  Social Connections: Not on file  Intimate Partner Violence: Not on file    Family History  Problem Relation Age of Onset   Heart attack Mother    Angina Mother    Hypertension Mother    Dementia Mother    Seizures Father    Hypertension Sister    Hypertension Brother    Colon cancer Neg Hx     ROS: no fevers or chills, productive cough, hemoptysis, dysphasia, odynophagia, melena, hematochezia, dysuria, hematuria, rash, seizure activity, orthopnea, PND, pedal edema, claudication. Remaining systems are negative.  Physical Exam: Well-developed well-nourished in no acute distress.  Skin is warm and dry.  HEENT is normal.  Neck is supple.  Chest is clear to auscultation with normal expansion.  Cardiovascular exam is regular rate and rhythm.  Abdominal exam nontender or distended. No masses palpated. Extremities show no edema. neuro grossly intact   A/P  1 status post mitral valve repair-continue SBE prophylaxis.  Will likely repeat echocardiogram when he returns in 1 year.  2 hypertension-blood pressure controlled.  Continue present medications.  3 hyperlipidemia-continue statin.  4 palpitations-symptoms have improved.  Will continue to follow.  5 history of aortic atherosclerosis-continue aspirin and statin.  [name], MD

## 2022-07-21 ENCOUNTER — Encounter: Payer: Self-pay | Admitting: Cardiology

## 2022-07-21 ENCOUNTER — Ambulatory Visit: Payer: Medicare HMO | Attending: Cardiology | Admitting: Cardiology

## 2022-07-21 VITALS — BP 134/76 | HR 59 | Ht 67.0 in | Wt 174.0 lb

## 2022-07-21 DIAGNOSIS — I10 Essential (primary) hypertension: Secondary | ICD-10-CM | POA: Diagnosis not present

## 2022-07-21 DIAGNOSIS — I700 Atherosclerosis of aorta: Secondary | ICD-10-CM

## 2022-07-21 DIAGNOSIS — Z9889 Other specified postprocedural states: Secondary | ICD-10-CM

## 2022-07-21 DIAGNOSIS — E785 Hyperlipidemia, unspecified: Secondary | ICD-10-CM | POA: Diagnosis not present

## 2022-07-21 DIAGNOSIS — R002 Palpitations: Secondary | ICD-10-CM

## 2022-07-21 NOTE — Patient Instructions (Signed)
Medication Instructions:   Your physician recommends that you continue on your current medications as directed. Please refer to the Current Medication list given to you today.  *If you need a refill on your cardiac medications before your next appointment, please call your pharmacy*  Lab Work: NONE ordered at this time of appointment   If you have labs (blood work) drawn today and your tests are completely normal, you will receive your results only by: [place] (if you have MyChart) OR A paper copy in the mail If you have any lab test that is abnormal or we need to change your treatment, we will call you to review the results.  Testing/Procedures: NONE ordered at this time of appointment   Follow-Up: At [facility], you and your health needs are our priority.  As part of our continuing mission to provide you with exceptional heart care, we have created designated Provider Care Teams.  These Care Teams include your primary Cardiologist (physician) and Advanced Practice Providers (APPs -  Physician Assistants and Nurse Practitioners) who all work together to provide you with the care you need, when you need it.   Your next appointment:   1 year(s)  Provider:   [name], MD     Other Instructions

## 2022-08-09 DIAGNOSIS — E785 Hyperlipidemia, unspecified: Secondary | ICD-10-CM | POA: Diagnosis not present

## 2022-08-09 DIAGNOSIS — E038 Other specified hypothyroidism: Secondary | ICD-10-CM | POA: Diagnosis not present

## 2022-08-09 DIAGNOSIS — N529 Male erectile dysfunction, unspecified: Secondary | ICD-10-CM | POA: Diagnosis not present

## 2022-08-09 DIAGNOSIS — I10 Essential (primary) hypertension: Secondary | ICD-10-CM | POA: Diagnosis not present

## 2022-08-09 DIAGNOSIS — D649 Anemia, unspecified: Secondary | ICD-10-CM | POA: Diagnosis not present

## 2022-08-09 DIAGNOSIS — Z125 Encounter for screening for malignant neoplasm of prostate: Secondary | ICD-10-CM | POA: Diagnosis not present

## 2022-08-09 DIAGNOSIS — R7889 Finding of other specified substances, not normally found in blood: Secondary | ICD-10-CM | POA: Diagnosis not present

## 2022-08-09 DIAGNOSIS — E291 Testicular hypofunction: Secondary | ICD-10-CM | POA: Diagnosis not present

## 2022-08-09 DIAGNOSIS — Z1212 Encounter for screening for malignant neoplasm of rectum: Secondary | ICD-10-CM | POA: Diagnosis not present

## 2022-08-14 DIAGNOSIS — Z1212 Encounter for screening for malignant neoplasm of rectum: Secondary | ICD-10-CM | POA: Diagnosis not present

## 2022-08-14 DIAGNOSIS — E291 Testicular hypofunction: Secondary | ICD-10-CM | POA: Diagnosis not present

## 2022-08-16 DIAGNOSIS — R82998 Other abnormal findings in urine: Secondary | ICD-10-CM | POA: Diagnosis not present

## 2022-08-16 DIAGNOSIS — Z1339 Encounter for screening examination for other mental health and behavioral disorders: Secondary | ICD-10-CM | POA: Diagnosis not present

## 2022-08-16 DIAGNOSIS — E291 Testicular hypofunction: Secondary | ICD-10-CM | POA: Diagnosis not present

## 2022-08-16 DIAGNOSIS — I700 Atherosclerosis of aorta: Secondary | ICD-10-CM | POA: Diagnosis not present

## 2022-08-16 DIAGNOSIS — I10 Essential (primary) hypertension: Secondary | ICD-10-CM | POA: Diagnosis not present

## 2022-08-16 DIAGNOSIS — Z1331 Encounter for screening for depression: Secondary | ICD-10-CM | POA: Diagnosis not present

## 2022-08-16 DIAGNOSIS — K8020 Calculus of gallbladder without cholecystitis without obstruction: Secondary | ICD-10-CM | POA: Diagnosis not present

## 2022-08-16 DIAGNOSIS — E785 Hyperlipidemia, unspecified: Secondary | ICD-10-CM | POA: Diagnosis not present

## 2022-08-16 DIAGNOSIS — I480 Paroxysmal atrial fibrillation: Secondary | ICD-10-CM | POA: Diagnosis not present

## 2022-08-16 DIAGNOSIS — E038 Other specified hypothyroidism: Secondary | ICD-10-CM | POA: Diagnosis not present

## 2022-09-02 DIAGNOSIS — R238 Other skin changes: Secondary | ICD-10-CM | POA: Diagnosis not present

## 2022-09-02 DIAGNOSIS — H9201 Otalgia, right ear: Secondary | ICD-10-CM | POA: Diagnosis not present

## 2022-11-08 DIAGNOSIS — R7989 Other specified abnormal findings of blood chemistry: Secondary | ICD-10-CM | POA: Diagnosis not present

## 2022-11-08 DIAGNOSIS — E785 Hyperlipidemia, unspecified: Secondary | ICD-10-CM | POA: Diagnosis not present

## 2022-11-08 DIAGNOSIS — I10 Essential (primary) hypertension: Secondary | ICD-10-CM | POA: Diagnosis not present

## 2022-11-11 ENCOUNTER — Other Ambulatory Visit: Payer: Self-pay | Admitting: Cardiology

## 2022-12-30 DIAGNOSIS — D6869 Other thrombophilia: Secondary | ICD-10-CM | POA: Diagnosis not present

## 2022-12-30 DIAGNOSIS — E785 Hyperlipidemia, unspecified: Secondary | ICD-10-CM | POA: Diagnosis not present

## 2022-12-30 DIAGNOSIS — I509 Heart failure, unspecified: Secondary | ICD-10-CM | POA: Diagnosis not present

## 2022-12-30 DIAGNOSIS — I110 Hypertensive heart disease with heart failure: Secondary | ICD-10-CM | POA: Diagnosis not present

## 2022-12-30 DIAGNOSIS — M545 Low back pain, unspecified: Secondary | ICD-10-CM | POA: Diagnosis not present

## 2022-12-30 DIAGNOSIS — E039 Hypothyroidism, unspecified: Secondary | ICD-10-CM | POA: Diagnosis not present

## 2022-12-30 DIAGNOSIS — K219 Gastro-esophageal reflux disease without esophagitis: Secondary | ICD-10-CM | POA: Diagnosis not present

## 2022-12-30 DIAGNOSIS — N529 Male erectile dysfunction, unspecified: Secondary | ICD-10-CM | POA: Diagnosis not present

## 2022-12-30 DIAGNOSIS — I872 Venous insufficiency (chronic) (peripheral): Secondary | ICD-10-CM | POA: Diagnosis not present

## 2022-12-30 DIAGNOSIS — H269 Unspecified cataract: Secondary | ICD-10-CM | POA: Diagnosis not present

## 2023-03-22 DIAGNOSIS — I10 Essential (primary) hypertension: Secondary | ICD-10-CM | POA: Diagnosis not present

## 2023-05-02 DIAGNOSIS — K648 Other hemorrhoids: Secondary | ICD-10-CM | POA: Diagnosis not present

## 2023-05-02 DIAGNOSIS — Z860101 Personal history of adenomatous and serrated colon polyps: Secondary | ICD-10-CM | POA: Diagnosis not present

## 2023-05-08 DIAGNOSIS — K1370 Unspecified lesions of oral mucosa: Secondary | ICD-10-CM | POA: Diagnosis not present

## 2023-05-18 ENCOUNTER — Encounter: Payer: Self-pay | Admitting: Cardiology

## 2023-05-18 ENCOUNTER — Telehealth: Payer: Self-pay | Admitting: Cardiology

## 2023-05-18 DIAGNOSIS — Z9889 Other specified postprocedural states: Secondary | ICD-10-CM

## 2023-05-18 NOTE — Telephone Encounter (Signed)
Spoke with pt, yearly echo scheduled in February. Yearly appointment scheduled as well.

## 2023-05-18 NOTE — Telephone Encounter (Signed)
Patient is requesting an order for an echocardiogram.

## 2023-05-22 DIAGNOSIS — R0982 Postnasal drip: Secondary | ICD-10-CM | POA: Diagnosis not present

## 2023-05-22 DIAGNOSIS — J069 Acute upper respiratory infection, unspecified: Secondary | ICD-10-CM | POA: Diagnosis not present

## 2023-05-22 DIAGNOSIS — K069 Disorder of gingiva and edentulous alveolar ridge, unspecified: Secondary | ICD-10-CM | POA: Diagnosis not present

## 2023-06-26 DIAGNOSIS — L821 Other seborrheic keratosis: Secondary | ICD-10-CM | POA: Diagnosis not present

## 2023-06-26 DIAGNOSIS — L814 Other melanin hyperpigmentation: Secondary | ICD-10-CM | POA: Diagnosis not present

## 2023-06-26 DIAGNOSIS — L570 Actinic keratosis: Secondary | ICD-10-CM | POA: Diagnosis not present

## 2023-07-19 ENCOUNTER — Ambulatory Visit (HOSPITAL_COMMUNITY): Payer: Medicare HMO | Attending: Internal Medicine

## 2023-07-19 ENCOUNTER — Encounter: Payer: Self-pay | Admitting: *Deleted

## 2023-07-19 DIAGNOSIS — I110 Hypertensive heart disease with heart failure: Secondary | ICD-10-CM | POA: Insufficient documentation

## 2023-07-19 DIAGNOSIS — Z48812 Encounter for surgical aftercare following surgery on the circulatory system: Secondary | ICD-10-CM | POA: Insufficient documentation

## 2023-07-19 DIAGNOSIS — Z952 Presence of prosthetic heart valve: Secondary | ICD-10-CM | POA: Diagnosis not present

## 2023-07-19 DIAGNOSIS — I491 Atrial premature depolarization: Secondary | ICD-10-CM | POA: Diagnosis not present

## 2023-07-19 DIAGNOSIS — I059 Rheumatic mitral valve disease, unspecified: Secondary | ICD-10-CM | POA: Diagnosis not present

## 2023-07-19 DIAGNOSIS — I509 Heart failure, unspecified: Secondary | ICD-10-CM | POA: Insufficient documentation

## 2023-07-19 DIAGNOSIS — Z9889 Other specified postprocedural states: Secondary | ICD-10-CM | POA: Diagnosis not present

## 2023-07-19 LAB — ECHOCARDIOGRAM COMPLETE
Area-P 1/2: 2.4 cm2
MV VTI: 1.06 cm2
S' Lateral: 2.3 cm

## 2023-07-24 NOTE — Progress Notes (Deleted)
 HPI: Follow-up history of mitral valve repair secondary to mitral valve prolapse mitral regurgitation, paroxysmal atrial fibrillation (postoperative), hypertension and hyperlipidemia. Previously followed by Dr. Elease Hashimoto. Patient had mitral valve repair in 2012. Note preoperative cardiac catheterization March 2012 showed no coronary disease. CTA November 2020 showed aortic atherosclerosis but no aneurysm.  Monitor December 2023 showed sinus rhythm with occasional PAC, brief PAT and PVCs.  Echocardiogram February 2025 showed normal LV function, grade 2 diastolic dysfunction, mild left atrial enlargement, status post mitral valve repair with mean gradient 4 mmHg, trace mitral regurgitation.  Since last seen   Current Outpatient Medications  Medication Sig Dispense Refill   amLODipine (NORVASC) 5 MG tablet Take 1 tablet (5 mg total) by mouth 2 (two) times daily. 180 tablet 3   amoxicillin (AMOXIL) 500 MG capsule Take 2,000 mg by mouth See admin instructions. Only takes when has dental work done     aspirin 81 MG tablet Take 81 mg by mouth daily.       Coenzyme Q10 (CO Q 10) 100 MG CAPS Take 1 tablet by mouth every morning.      fluticasone (FLONASE) 50 MCG/ACT nasal spray Place 2 sprays into both nostrils as needed.     levothyroxine (SYNTHROID) 112 MCG tablet Take 112 mcg by mouth every morning.     LUTEIN-ZEAXANTHIN PO Take 20 mg by mouth daily.      metoprolol (TOPROL-XL) 100 MG 24 hr tablet Take 100 mg by mouth daily.  MAY TAKE EXTRA 1/2 FOR SUSTAINED PALPITATIONS     pravastatin (PRAVACHOL) 40 MG tablet Take by mouth.     Simethicone 125 MG TABS Take 1 tablet by mouth daily as needed (gas).      triamcinolone cream (KENALOG) 0.1 % Apply 1 application topically as needed.     valsartan (DIOVAN) 160 MG tablet TAKE 1 TABLET BY MOUTH EVERY DAY 90 tablet 2   No current facility-administered medications for this visit.     Past Medical History:  Diagnosis Date   Arthritis    Atrial  fibrillation (HCC)    CHF (congestive heart failure) (HCC) CHRONIC SYSTOLIC WITH ACUTE EXACERBATIONS   GERD (gastroesophageal reflux disease)    Heart murmur    Hyperlipemia    Hypertension    MVP (mitral valve prolapse) WITH SEVERE MITRAL REGURG   Nephrolithiasis    Palpitations WITH PREMATURE VENTRICULAR COTRACTIONS AND PREMATURE ATRIAL CONTRACTIONS   Rheumatic fever REPORTED HISTORY   Sensorineural hearing loss    Varicose veins    Weakness of right upper extremity RELATED TO RUPTURED BICEPS TENDON    Past Surgical History:  Procedure Laterality Date   HEMORRHOID SURGERY     HERNIA REPAIR     HYDROCELE EXCISION / REPAIR Left    INGUINAL HERNIA REPAIR Right    Dr. Orson Slick   MITRAL VALVE REPAIR  08/19/2010   Dr. Barry Dienes.  complex valvuloplasty with 32mm ring annuloplasty via right mini thoracotomy (Dr. Cornelius Moras)   Multiple surgical procedures in his use for     ROTATOR CUFF REPAIR     The patient has also had     TONSILLECTOMY     URETER ECTOPIC RESECTION Left     Social History   Socioeconomic History   Marital status: Married    Spouse name: Not on file   Number of children: 0   Years of education: Not on file   Highest education level: Not on file  Occupational History   Not on file  Tobacco Use   Smoking status: Former    Current packs/day: 0.00    Types: Cigarettes    Quit date: 08/12/1994    Years since quitting: 28.9   Smokeless tobacco: Never  Vaping Use   Vaping status: Never Used  Substance and Sexual Activity   Alcohol use: No   Drug use: No   Sexual activity: Not on file  Other Topics Concern   Not on file  Social History Narrative   Not on file   Social Drivers of Health   Financial Resource Strain: Not on file  Food Insecurity: Not on file  Transportation Needs: Not on file  Physical Activity: Not on file  Stress: Not on file  Social Connections: Not on file  Intimate Partner Violence: Not on file    Family History  Problem Relation Age of  Onset   Heart attack Mother    Angina Mother    Hypertension Mother    Dementia Mother    Seizures Father    Hypertension Sister    Hypertension Brother    Colon cancer Neg Hx     ROS: no fevers or chills, productive cough, hemoptysis, dysphasia, odynophagia, melena, hematochezia, dysuria, hematuria, rash, seizure activity, orthopnea, PND, pedal edema, claudication. Remaining systems are negative.  Physical Exam: Well-developed well-nourished in no acute distress.  Skin is warm and dry.  HEENT is normal.  Neck is supple.  Chest is clear to auscultation with normal expansion.  Cardiovascular exam is regular rate and rhythm.  Abdominal exam nontender or distended. No masses palpated. Extremities show no edema. neuro grossly intact  ECG- personally reviewed  A/P  1 status post mitral valve repair-most recent echocardiogram showed stable repair with at most mild mitral stenosis and trace mitral regurgitation.  Continue SBE prophylaxis.  2 hyperlipidemia-continue statin.  3 hypertension-patient's blood pressure is controlled today.  Continue present medical regimen.  4 aortic atherosclerosis-statin.  5 history of palpitations-no recent symptoms.  Olga Millers, MD

## 2023-08-01 ENCOUNTER — Encounter: Payer: Self-pay | Admitting: Podiatry

## 2023-08-01 ENCOUNTER — Ambulatory Visit: Payer: Medicare HMO | Admitting: Podiatry

## 2023-08-01 DIAGNOSIS — D2372 Other benign neoplasm of skin of left lower limb, including hip: Secondary | ICD-10-CM

## 2023-08-01 DIAGNOSIS — M7752 Other enthesopathy of left foot: Secondary | ICD-10-CM

## 2023-08-01 DIAGNOSIS — B351 Tinea unguium: Secondary | ICD-10-CM | POA: Diagnosis not present

## 2023-08-01 DIAGNOSIS — M79676 Pain in unspecified toe(s): Secondary | ICD-10-CM

## 2023-08-01 DIAGNOSIS — Z7901 Long term (current) use of anticoagulants: Secondary | ICD-10-CM | POA: Insufficient documentation

## 2023-08-01 DIAGNOSIS — I4891 Unspecified atrial fibrillation: Secondary | ICD-10-CM | POA: Insufficient documentation

## 2023-08-01 MED ORDER — DEXAMETHASONE SODIUM PHOSPHATE 120 MG/30ML IJ SOLN
2.0000 mg | Freq: Once | INTRAMUSCULAR | Status: AC
  Administered 2023-08-01: 2 mg via INTRA_ARTICULAR

## 2023-08-02 NOTE — Progress Notes (Signed)
 He presents today chief complaint of painful toenails and a painful callused area to the plantar aspect of the fifth metatarsal diaphyseal region of his left foot.  Objective: Vital signs stable alert orient x 3 pulses are strong and palpable.  Toenails are long thick yellow dystrophic onychomycotic he also has a plantar mid diaphyseal fifth metatarsal poor keratoma with what appears to be fluctuance beneath it I think that this is more than likely bursitis though it is not placed for bursitis.  Assessment: Bursitis subfifth met left.  Pain limb secondary to onychomycosis.  Plan: Debridement of toenails 1 through 5 bilateral and injected the area today with 2 mg of dexamethasone local anesthetic beneath the lesion.  I did and debrided the lesion and enucleated it.  Placed Band-Aid.  Follow-up with him as needed.

## 2023-08-03 ENCOUNTER — Ambulatory Visit: Payer: Medicare HMO | Admitting: Cardiology

## 2023-08-21 DIAGNOSIS — D649 Anemia, unspecified: Secondary | ICD-10-CM | POA: Diagnosis not present

## 2023-08-21 DIAGNOSIS — I10 Essential (primary) hypertension: Secondary | ICD-10-CM | POA: Diagnosis not present

## 2023-08-21 DIAGNOSIS — E038 Other specified hypothyroidism: Secondary | ICD-10-CM | POA: Diagnosis not present

## 2023-08-21 DIAGNOSIS — Z125 Encounter for screening for malignant neoplasm of prostate: Secondary | ICD-10-CM | POA: Diagnosis not present

## 2023-08-21 DIAGNOSIS — Z1212 Encounter for screening for malignant neoplasm of rectum: Secondary | ICD-10-CM | POA: Diagnosis not present

## 2023-08-21 DIAGNOSIS — E291 Testicular hypofunction: Secondary | ICD-10-CM | POA: Diagnosis not present

## 2023-08-21 DIAGNOSIS — E785 Hyperlipidemia, unspecified: Secondary | ICD-10-CM | POA: Diagnosis not present

## 2023-08-21 LAB — PSA: PSA, Total: 1.7

## 2023-08-21 LAB — COMPREHENSIVE METABOLIC PANEL WITH GFR: EGFR (Non-African Amer.): 65.6

## 2023-08-25 LAB — T4, FREE: Free T4: 1.31 ng/dL

## 2023-08-25 LAB — TSH: TSH: 4.44 (ref 0.41–5.90)

## 2023-08-28 DIAGNOSIS — Z9889 Other specified postprocedural states: Secondary | ICD-10-CM | POA: Diagnosis not present

## 2023-08-28 DIAGNOSIS — Z0000 Encounter for general adult medical examination without abnormal findings: Secondary | ICD-10-CM | POA: Diagnosis not present

## 2023-08-28 DIAGNOSIS — K8020 Calculus of gallbladder without cholecystitis without obstruction: Secondary | ICD-10-CM | POA: Diagnosis not present

## 2023-08-28 DIAGNOSIS — I10 Essential (primary) hypertension: Secondary | ICD-10-CM | POA: Diagnosis not present

## 2023-08-28 DIAGNOSIS — E785 Hyperlipidemia, unspecified: Secondary | ICD-10-CM | POA: Diagnosis not present

## 2023-08-28 DIAGNOSIS — I480 Paroxysmal atrial fibrillation: Secondary | ICD-10-CM | POA: Diagnosis not present

## 2023-08-28 DIAGNOSIS — E291 Testicular hypofunction: Secondary | ICD-10-CM | POA: Diagnosis not present

## 2023-08-28 DIAGNOSIS — R82998 Other abnormal findings in urine: Secondary | ICD-10-CM | POA: Diagnosis not present

## 2023-10-31 ENCOUNTER — Ambulatory Visit: Payer: Medicare HMO | Admitting: Podiatry

## 2023-10-31 ENCOUNTER — Encounter: Payer: Self-pay | Admitting: Podiatry

## 2023-10-31 DIAGNOSIS — M79676 Pain in unspecified toe(s): Secondary | ICD-10-CM

## 2023-10-31 DIAGNOSIS — B351 Tinea unguium: Secondary | ICD-10-CM | POA: Diagnosis not present

## 2023-10-31 NOTE — Progress Notes (Signed)
 He presents today chief complaint of painful calluses plantar aspect of the forefoot bilateral both are beneath the fifth metatarsal head bilaterally.  Objective: Vital signs stable oriented x 3.  Pulses are palpable.  There is no erythema Dem salines drainage of any fifth metatarsal heads bilateral bursa with overlying benign neoplasms.  Toenails are long.  Assessment: Pain in limb secondary onychomycosis benign skin lesions with underlying bursa.  Plan: Injected the bursa bilaterally beneath the fifth metatarsal head bilaterally.  Trimmed nails 1 through 5 bilateral covered service secondary pain debridement 9 skin lesions.

## 2023-11-16 NOTE — Progress Notes (Signed)
 HPI: Follow-up history of mitral valve repair secondary to mitral valve prolapse mitral regurgitation, paroxysmal atrial fibrillation (postoperative), hypertension and hyperlipidemia. Patient had mitral valve repair in 2012. Note preoperative cardiac catheterization March 2012 showed no coronary disease. CTA November 2020 showed aortic atherosclerosis but no aneurysm. Monitor December 2023 showed sinus rhythm with occasional PAC, brief PAT and PVCs.  Echocardiogram repeated February 2025 and showed normal LV function, grade 2 diastolic dysfunction, mild left atrial enlargement, status post mitral valve repair with mean gradient 4 mmHg, trace aortic insufficiency.  Since last seen the patient denies any dyspnea on exertion, orthopnea, PND, pedal edema, palpitations, syncope or chest pain.   Current Outpatient Medications  Medication Sig Dispense Refill   amLODipine  (NORVASC ) 5 MG tablet Take 1 tablet (5 mg total) by mouth 2 (two) times daily. 180 tablet 3   aspirin 81 MG tablet Take 81 mg by mouth daily.       Coenzyme Q10 (CO Q 10) 100 MG CAPS Take 1 tablet by mouth every morning.      ezetimibe (ZETIA) 10 MG tablet Take 10 mg by mouth daily.     fluticasone (FLONASE) 50 MCG/ACT nasal spray Place 2 sprays into both nostrils as needed.     levothyroxine  (SYNTHROID ) 112 MCG tablet Take 112 mcg by mouth every morning.     Lutein 20 MG CAPS Take 20 mg by mouth every other day.     metoprolol (TOPROL-XL) 100 MG 24 hr tablet Take 100 mg by mouth daily.  MAY TAKE EXTRA 1/2 FOR SUSTAINED PALPITATIONS     pravastatin (PRAVACHOL) 40 MG tablet Take by mouth.     pravastatin (PRAVACHOL) 40 MG tablet Take 40 mg by mouth daily.     Simethicone 125 MG TABS Take 1 tablet by mouth daily as needed (gas).      triamcinolone cream (KENALOG) 0.1 % Apply 1 application topically as needed.     valsartan  (DIOVAN ) 160 MG tablet TAKE 1 TABLET BY MOUTH EVERY DAY 90 tablet 2   amoxicillin (AMOXIL) 500 MG capsule Take  2,000 mg by mouth See admin instructions. Only takes when has dental work done (Patient not taking: Reported on 11/28/2023)     LUTEIN-ZEAXANTHIN PO Take 20 mg by mouth daily.  (Patient not taking: Reported on 11/28/2023)     No current facility-administered medications for this visit.     Past Medical History:  Diagnosis Date   Arthritis    Atrial fibrillation (HCC)    CHF (congestive heart failure) (HCC) CHRONIC SYSTOLIC WITH ACUTE EXACERBATIONS   GERD (gastroesophageal reflux disease)    Heart murmur    Hyperlipemia    Hypertension    MVP (mitral valve prolapse) WITH SEVERE MITRAL REGURG   Nephrolithiasis    Palpitations WITH PREMATURE VENTRICULAR COTRACTIONS AND PREMATURE ATRIAL CONTRACTIONS   Rheumatic fever REPORTED HISTORY   Sensorineural hearing loss    Varicose veins    Weakness of right upper extremity RELATED TO RUPTURED BICEPS TENDON    Past Surgical History:  Procedure Laterality Date   HEMORRHOID SURGERY     HERNIA REPAIR     HYDROCELE EXCISION / REPAIR Left    INGUINAL HERNIA REPAIR Right    Dr. Duwaine Gins   MITRAL VALVE REPAIR  08/19/2010   Dr. Lander Pines.  complex valvuloplasty with 32mm ring annuloplasty via right mini thoracotomy (Dr. Alva Jewels)   Multiple surgical procedures in his use for     ROTATOR CUFF REPAIR     The  patient has also had     TONSILLECTOMY     URETER ECTOPIC RESECTION Left     Social History   Socioeconomic History   Marital status: Married    Spouse name: Not on file   Number of children: 0   Years of education: Not on file   Highest education level: Not on file  Occupational History   Not on file  Tobacco Use   Smoking status: Former    Current packs/day: 0.00    Types: Cigarettes    Quit date: 08/12/1994    Years since quitting: 29.3   Smokeless tobacco: Never  Vaping Use   Vaping status: Never Used  Substance and Sexual Activity   Alcohol  use: No   Drug use: No   Sexual activity: Not on file  Other Topics Concern   Not on file   Social History Narrative   Not on file   Social Drivers of Health   Financial Resource Strain: Not on file  Food Insecurity: Not on file  Transportation Needs: Not on file  Physical Activity: Not on file  Stress: Not on file  Social Connections: Not on file  Intimate Partner Violence: Not on file    Family History  Problem Relation Age of Onset   Heart attack Mother    Angina Mother    Hypertension Mother    Dementia Mother    Seizures Father    Hypertension Sister    Hypertension Brother    Colon cancer Neg Hx     ROS: no fevers or chills, productive cough, hemoptysis, dysphasia, odynophagia, melena, hematochezia, dysuria, hematuria, rash, seizure activity, orthopnea, PND, pedal edema, claudication. Remaining systems are negative.  Physical Exam: Well-developed well-nourished in no acute distress.  Skin is warm and dry.  HEENT is normal.  Neck is supple.  Chest is clear to auscultation with normal expansion.  Cardiovascular exam is regular rate and rhythm.  Abdominal exam nontender or distended. No masses palpated. Extremities show no edema. neuro grossly intact  EKG Interpretation Date/Time:  Tuesday November 28 2023 14:47:12 EDT Ventricular Rate:  56 PR Interval:  164 QRS Duration:  74 QT Interval:  410 QTC Calculation: 395 R Axis:   15  Text Interpretation: Sinus bradycardia ST elevation, consider early repolarization Confirmed by Alexandria Angel ([id number]) on 11/28/2023 3:00:20 PM    A/P  1 status post mitral valve repair-most recent echocardiogram showed normally functioning valve.  Continue SBE prophylaxis.  2 hyperlipidemia-continue zetia and statin.  3 hypertension-patient's blood pressure is controlled.  Continue present medical regimen.  4 history of palpitations-no recent symptoms.  Will follow-up.  5 history of aortic atherosclerosis-continue statin.  Alexandria Angel, MD

## 2023-11-23 DIAGNOSIS — E038 Other specified hypothyroidism: Secondary | ICD-10-CM | POA: Diagnosis not present

## 2023-11-23 DIAGNOSIS — E785 Hyperlipidemia, unspecified: Secondary | ICD-10-CM | POA: Diagnosis not present

## 2023-11-28 ENCOUNTER — Encounter: Payer: Self-pay | Admitting: Cardiology

## 2023-11-28 ENCOUNTER — Ambulatory Visit: Payer: Medicare HMO | Attending: Cardiology | Admitting: Cardiology

## 2023-11-28 VITALS — BP 120/60 | HR 56 | Ht 67.5 in

## 2023-11-28 DIAGNOSIS — I10 Essential (primary) hypertension: Secondary | ICD-10-CM | POA: Diagnosis not present

## 2023-11-28 DIAGNOSIS — E785 Hyperlipidemia, unspecified: Secondary | ICD-10-CM

## 2023-11-28 DIAGNOSIS — Z9889 Other specified postprocedural states: Secondary | ICD-10-CM

## 2023-11-28 DIAGNOSIS — R002 Palpitations: Secondary | ICD-10-CM | POA: Diagnosis not present

## 2023-11-28 NOTE — Patient Instructions (Signed)

## 2023-12-04 ENCOUNTER — Encounter: Payer: Self-pay | Admitting: Internal Medicine

## 2023-12-13 ENCOUNTER — Encounter: Payer: Self-pay | Admitting: Cardiology

## 2024-02-06 ENCOUNTER — Ambulatory Visit: Admitting: Podiatry

## 2024-02-06 DIAGNOSIS — B351 Tinea unguium: Secondary | ICD-10-CM | POA: Diagnosis not present

## 2024-02-06 DIAGNOSIS — M79676 Pain in unspecified toe(s): Secondary | ICD-10-CM | POA: Diagnosis not present

## 2024-02-06 DIAGNOSIS — D2372 Other benign neoplasm of skin of left lower limb, including hip: Secondary | ICD-10-CM | POA: Diagnosis not present

## 2024-02-07 NOTE — Progress Notes (Signed)
 He presents today chief complaint of painful elongated toenails and calluses plantar aspect of the forefoot bilateral.  Objective: Vital signs are stable alert oriented x 3.  Calluses beneath the forefoot are beneath the metatarsal phalangeal joints independently.  There are no open lesions or wounds but these are benign skin neoplasms.  Toenails are long thick yellow dystrophic like mycotic but well-kept.  Assessment: Pain in limb secondary to onychomycosis benign skin lesions bilaterally.  Plan: Debridement of toenails 1 through 5 bilateral covered service secondary to pain and debridement of benign skin lesions.

## 2024-03-28 DIAGNOSIS — H2513 Age-related nuclear cataract, bilateral: Secondary | ICD-10-CM | POA: Diagnosis not present

## 2024-03-28 DIAGNOSIS — H35363 Drusen (degenerative) of macula, bilateral: Secondary | ICD-10-CM | POA: Diagnosis not present

## 2024-03-28 DIAGNOSIS — H524 Presbyopia: Secondary | ICD-10-CM | POA: Diagnosis not present

## 2024-03-28 DIAGNOSIS — H52223 Regular astigmatism, bilateral: Secondary | ICD-10-CM | POA: Diagnosis not present

## 2024-03-28 DIAGNOSIS — Z135 Encounter for screening for eye and ear disorders: Secondary | ICD-10-CM | POA: Diagnosis not present

## 2024-03-28 DIAGNOSIS — H5213 Myopia, bilateral: Secondary | ICD-10-CM | POA: Diagnosis not present

## 2024-05-02 ENCOUNTER — Encounter: Payer: Self-pay | Admitting: Podiatry

## 2024-05-02 ENCOUNTER — Ambulatory Visit: Admitting: Podiatry

## 2024-05-02 DIAGNOSIS — D2372 Other benign neoplasm of skin of left lower limb, including hip: Secondary | ICD-10-CM

## 2024-05-02 DIAGNOSIS — B351 Tinea unguium: Secondary | ICD-10-CM | POA: Diagnosis not present

## 2024-05-02 DIAGNOSIS — M79676 Pain in unspecified toe(s): Secondary | ICD-10-CM

## 2024-05-02 DIAGNOSIS — M87052 Idiopathic aseptic necrosis of left femur: Secondary | ICD-10-CM | POA: Insufficient documentation

## 2024-05-02 DIAGNOSIS — M87059 Idiopathic aseptic necrosis of unspecified femur: Secondary | ICD-10-CM | POA: Insufficient documentation

## 2024-05-02 DIAGNOSIS — M353 Polymyalgia rheumatica: Secondary | ICD-10-CM | POA: Insufficient documentation

## 2024-05-06 NOTE — Progress Notes (Signed)
 He presents today chief complaint of painful elongated toenails and calluses plantar aspect of the forefoot bilateral.  Objective: Vital signs are stable alert oriented x 3.  Calluses beneath the forefoot are beneath the metatarsal phalangeal joints independently.  There are no open lesions or wounds but these are benign skin neoplasms.  Toenails are long thick yellow dystrophic like mycotic but well-kept.  Assessment: Pain in limb secondary to onychomycosis benign skin lesions bilaterally.  Plan: Debridement of toenails 1 through 5 bilateral covered service secondary to pain and debridement of benign skin lesions.

## 2024-05-17 ENCOUNTER — Other Ambulatory Visit (HOSPITAL_COMMUNITY): Payer: Self-pay

## 2024-05-17 MED ORDER — CLOBETASOL PROPIONATE 0.05 % EX OINT
1.0000 | TOPICAL_OINTMENT | Freq: Two times a day (BID) | CUTANEOUS | 5 refills | Status: AC
  Filled 2024-05-17: qty 15, 8d supply, fill #0

## 2024-05-21 ENCOUNTER — Ambulatory Visit: Admitting: Podiatry

## 2024-06-18 ENCOUNTER — Ambulatory Visit: Admitting: Podiatry

## 2024-08-01 ENCOUNTER — Ambulatory Visit: Admitting: Podiatry
